# Patient Record
Sex: Male | Born: 1955
Health system: Southern US, Community
[De-identification: ages and names within clinical notes are randomized; demographics above are authoritative.]

## PROBLEM LIST (undated history)

## (undated) DIAGNOSIS — I10 Essential (primary) hypertension: Secondary | ICD-10-CM

## (undated) DIAGNOSIS — R51 Headache: Secondary | ICD-10-CM

## (undated) DIAGNOSIS — E7800 Pure hypercholesterolemia, unspecified: Secondary | ICD-10-CM

## (undated) HISTORY — PX: OTHER SURGICAL HISTORY: SHX169

---

## 1999-04-21 ENCOUNTER — Emergency Department (HOSPITAL_COMMUNITY): Admission: EM | Admit: 1999-04-21 | Discharge: 1999-04-21 | Payer: Self-pay | Admitting: Emergency Medicine

## 1999-04-21 ENCOUNTER — Encounter: Payer: Self-pay | Admitting: Emergency Medicine

## 1999-10-26 ENCOUNTER — Emergency Department (HOSPITAL_COMMUNITY): Admission: EM | Admit: 1999-10-26 | Discharge: 1999-10-26 | Payer: Self-pay | Admitting: Emergency Medicine

## 2012-09-08 ENCOUNTER — Encounter (HOSPITAL_COMMUNITY): Payer: Self-pay | Admitting: Emergency Medicine

## 2012-09-08 ENCOUNTER — Emergency Department (HOSPITAL_COMMUNITY)
Admission: EM | Admit: 2012-09-08 | Discharge: 2012-09-09 | Disposition: A | Discharge status: Home / Self Care | Payer: Self-pay | Attending: Emergency Medicine | Admitting: Emergency Medicine

## 2012-09-08 DIAGNOSIS — R059 Cough, unspecified: Secondary | ICD-10-CM | POA: Insufficient documentation

## 2012-09-08 DIAGNOSIS — R110 Nausea: Secondary | ICD-10-CM

## 2012-09-08 DIAGNOSIS — F411 Generalized anxiety disorder: Secondary | ICD-10-CM | POA: Insufficient documentation

## 2012-09-08 DIAGNOSIS — R05 Cough: Secondary | ICD-10-CM | POA: Insufficient documentation

## 2012-09-08 DIAGNOSIS — J111 Influenza due to unidentified influenza virus with other respiratory manifestations: Secondary | ICD-10-CM

## 2012-09-08 DIAGNOSIS — R197 Diarrhea, unspecified: Secondary | ICD-10-CM

## 2012-09-08 DIAGNOSIS — F17200 Nicotine dependence, unspecified, uncomplicated: Secondary | ICD-10-CM | POA: Insufficient documentation

## 2012-09-08 DIAGNOSIS — R6883 Chills (without fever): Secondary | ICD-10-CM | POA: Insufficient documentation

## 2012-09-08 MED ORDER — ACETAMINOPHEN 325 MG PO TABS
650.0000 mg | ORAL_TABLET | Freq: Once | ORAL | Status: AC
  Administered 2012-09-08: 650 mg via ORAL
  Filled 2012-09-08: qty 2

## 2012-09-08 NOTE — ED Notes (Signed)
Pt presents to the Ed by [facility].  Pt complains "I am cold."  Pt refused EMS to remove coat and get vitals signs.  Pt also complaining of chills.

## 2012-09-09 ENCOUNTER — Emergency Department (HOSPITAL_COMMUNITY): Payer: Self-pay

## 2012-09-09 LAB — CBC WITH DIFFERENTIAL/PLATELET
Eosinophils Absolute: 0.1 10*3/uL (ref 0.0–0.7)
HCT: 42.1 % (ref 39.0–52.0)
Hemoglobin: 14.4 g/dL (ref 13.0–17.0)
Lymphs Abs: 0.8 10*3/uL (ref 0.7–4.0)
MCH: 29.6 pg (ref 26.0–34.0)
Monocytes Absolute: 0.7 10*3/uL (ref 0.1–1.0)
Monocytes Relative: 12 % (ref 3–12)
Neutrophils Relative %: 72 % (ref 43–77)
RBC: 4.87 MIL/uL (ref 4.22–5.81)

## 2012-09-09 LAB — COMPREHENSIVE METABOLIC PANEL
Alkaline Phosphatase: 54 U/L (ref 39–117)
BUN: 15 mg/dL (ref 6–23)
Chloride: 103 mEq/L (ref 96–112)
Creatinine, Ser: 1.03 mg/dL (ref 0.50–1.35)
GFR calc Af Amer: >90 mL/min (ref >90)
Glucose, Bld: 96 mg/dL (ref 70–99)
Potassium: 4.1 mEq/L (ref 3.5–5.1)
Total Bilirubin: 0.2 mg/dL — ABNORMAL LOW (ref 0.3–1.2)
Total Protein: 6.9 g/dL (ref 6.0–8.3)

## 2012-09-09 LAB — URINALYSIS, ROUTINE W REFLEX MICROSCOPIC
Ketones, ur: NEGATIVE mg/dL
Leukocytes, UA: NEGATIVE
Nitrite: NEGATIVE
Protein, ur: NEGATIVE mg/dL
Urobilinogen, UA: 1 mg/dL (ref 0.0–1.0)

## 2012-09-09 LAB — INFLUENZA PANEL BY PCR (TYPE A & B): Influenza B By PCR: NEGATIVE

## 2012-09-09 MED ORDER — SODIUM CHLORIDE 0.9 % IV SOLN
1000.0000 mL | INTRAVENOUS | Status: DC
  Administered 2012-09-09: 1000 mL via INTRAVENOUS

## 2012-09-09 MED ORDER — PROMETHAZINE HCL 25 MG PO TABS: 25.0000 mg | ORAL_TABLET | Freq: Four times a day (QID) | ORAL | Status: DC | PRN

## 2012-09-09 MED ORDER — SODIUM CHLORIDE 0.9 % IV SOLN
1000.0000 mL | Freq: Once | INTRAVENOUS | Status: AC
  Administered 2012-09-09: 1000 mL via INTRAVENOUS

## 2012-09-09 MED ORDER — OSELTAMIVIR PHOSPHATE 75 MG PO CAPS: 75.0000 mg | ORAL_CAPSULE | Freq: Two times a day (BID) | ORAL | Status: DC

## 2012-09-09 MED ORDER — MUCINEX DM MAXIMUM STRENGTH 60-1200 MG PO TB12: 1.0000 | ORAL_TABLET | Freq: Two times a day (BID) | ORAL | Status: DC

## 2012-09-09 MED ORDER — ONDANSETRON HCL 4 MG/2ML IJ SOLN
4.0000 mg | Freq: Once | INTRAMUSCULAR | Status: AC
  Administered 2012-09-09: 4 mg via INTRAVENOUS
  Filled 2012-09-09: qty 2

## 2012-09-09 MED ORDER — IBUPROFEN 800 MG PO TABS
800.0000 mg | ORAL_TABLET | Freq: Once | ORAL | Status: AC
  Administered 2012-09-09: 800 mg via ORAL
  Filled 2012-09-09: qty 1

## 2012-09-09 NOTE — ED Notes (Signed)
Pt sts he began feeling ill a few days ago with fever and generalized body aches. Patient chilled at this time. Denies vomiting or diarrhea, endorses nausea.

## 2012-09-09 NOTE — ED Provider Notes (Signed)
History     CSN: [id number]  Arrival date & time 09/08/12  2316   First MD Initiated Contact with Patient 09/08/12 2330      Chief Complaint  Patient presents with  . Chills    (Consider location/radiation/quality/duration/timing/severity/associated sxs/prior treatment) HPI  Patient presents EMS for feeling cold. Reports today he started having chills with cough that's nonproductive. He states sometimes he feels short of breath. He denies chest pain but has intense diffuse body aches. He has mild sore throat. He reports he's had nausea without vomiting and he has been having diarrhea. He denies being around a meal since that period    PCP none   History reviewed. No pertinent past medical history.  History reviewed. No pertinent past surgical history.  History reviewed. No pertinent family history.  History  Substance Use Topics  . Smoking status: Current Every Day Smoker -- 1.0 packs/day  . Smokeless tobacco: Not on file  . Alcohol Use: Yes   Recently moved from [place], [place]   Review of Systems  All other systems reviewed and are negative.    Allergies  Review of patient's allergies indicates no known allergies.  Home Medications   Current Outpatient Rx  Name  Route  Sig  Dispense  Refill  . MUCINEX DM MAXIMUM STRENGTH 60-1200 MG PO TB12   Oral   Take 1 tablet by mouth 2 (two) times daily.   28 each   0   . OSELTAMIVIR PHOSPHATE 75 MG PO CAPS   Oral   Take 1 capsule (75 mg total) by mouth every 12 (twelve) hours.   10 capsule   0   . PROMETHAZINE HCL 25 MG PO TABS   Oral   Take 1 tablet (25 mg total) by mouth every 6 (six) hours as needed for nausea.   8 tablet   0     BP 177/80  Pulse 95  Temp 101.2 F (38.4 C) (Oral)  Resp 24  SpO2 100%  Vital signs normal except for fever   Physical Exam  Nursing note and vitals reviewed. Constitutional: He is oriented to person, place, and time. He appears well-developed and well-nourished.   Non-toxic appearance. He does not appear ill. He appears distressed.       Patient constantly complaining he feels cold  HENT:  Head: Normocephalic and atraumatic.  Right Ear: External ear normal.  Left Ear: External ear normal.  Nose: Nose normal. No mucosal edema or rhinorrhea.  Mouth/Throat: Oropharynx is clear and moist and mucous membranes are normal. No dental abscesses or uvula swelling.  Eyes: Conjunctivae normal and EOM are normal. Pupils are equal, round, and reactive to light.  Neck: Normal range of motion and full passive range of motion without pain. Neck supple.  Cardiovascular: Normal rate, regular rhythm and normal heart sounds.  Exam reveals no gallop and no friction rub.   No murmur heard. Pulmonary/Chest: Effort normal and breath sounds normal. No respiratory distress. He has no wheezes. He has no rhonchi. He has no rales. He exhibits no tenderness and no crepitus.  Abdominal: Soft. Normal appearance and bowel sounds are normal. He exhibits no distension. There is no tenderness. There is no rebound and no guarding.  Musculoskeletal: Normal range of motion. He exhibits no edema and no tenderness.       Moves all extremities well.   Neurological: He is alert and oriented to person, place, and time. He has normal strength. No cranial nerve deficit.  Skin: Skin is  warm, dry and intact. No rash noted. No erythema. No pallor.  Psychiatric: His speech is normal and behavior is normal. His mood appears not anxious.       Anxious    ED Course  Procedures (including critical care time)  Medications  0.9 %  sodium chloride infusion (0 mL Intravenous Stopped 09/09/12 0223)    Followed by  0.9 %  sodium chloride infusion (1000 mL Intravenous New Bag/Given 09/09/12 0225)  acetaminophen (TYLENOL) tablet 650 mg (650 mg Oral Given 09/08/12 2338)  ibuprofen (ADVIL,MOTRIN) tablet 800 mg (800 mg Oral Given 09/09/12 0111)  ondansetron (ZOFRAN) injection 4 mg (4 mg Intravenous Given  09/09/12 0107)   Patient feeling better at time of discharge. He was advised to stay away from the general public as he is contagious.  Results for orders placed during the hospital encounter of 09/08/12  CBC WITH DIFFERENTIAL      Component Value Range   WBC 5.8  4.0 - 10.5 K/uL   RBC 4.87  4.22 - 5.81 MIL/uL   Hemoglobin 14.4  13.0 - 17.0 g/dL   HCT 14.7  82.9 - 56.2 %   MCV 86.4  78.0 - 100.0 fL   MCH 29.6  26.0 - 34.0 pg   MCHC 34.2  30.0 - 36.0 g/dL   RDW 13.0  86.5 - 78.4 %   Platelets 168  150 - 400 K/uL   Neutrophils Relative 72  43 - 77 %   Neutro Abs 4.2  1.7 - 7.7 K/uL   Lymphocytes Relative 14  12 - 46 %   Lymphs Abs 0.8  0.7 - 4.0 K/uL   Monocytes Relative 12  3 - 12 %   Monocytes Absolute 0.7  0.1 - 1.0 K/uL   Eosinophils Relative 2  0 - 5 %   Eosinophils Absolute 0.1  0.0 - 0.7 K/uL   Basophils Relative 0  0 - 1 %   Basophils Absolute 0.0  0.0 - 0.1 K/uL  COMPREHENSIVE METABOLIC PANEL      Component Value Range   Sodium 136  135 - 145 mEq/L   Potassium 4.1  3.5 - 5.1 mEq/L   Chloride 103  96 - 112 mEq/L   CO2 23  19 - 32 mEq/L   Glucose, Bld 96  70 - 99 mg/dL   BUN 15  6 - 23 mg/dL   Creatinine, Ser 6.96  0.50 - 1.35 mg/dL   Calcium 9.6  8.4 - 29.5 mg/dL   Total Protein 6.9  6.0 - 8.3 g/dL   Albumin 3.6  3.5 - 5.2 g/dL   AST 21  0 - 37 U/L   ALT 29  0 - 53 U/L   Alkaline Phosphatase 54  39 - 117 U/L   Total Bilirubin 0.2 (*) 0.3 - 1.2 mg/dL   GFR calc non Af Amer 79 (*) >90 mL/min   GFR calc Af Amer >90  >90 mL/min  URINALYSIS, ROUTINE W REFLEX MICROSCOPIC      Component Value Range   Color, Urine YELLOW  YELLOW   APPearance CLEAR  CLEAR   Specific Gravity, Urine 1.026  1.005 - 1.030   pH 7.0  5.0 - 8.0   Glucose, UA NEGATIVE  NEGATIVE mg/dL   Hgb urine dipstick NEGATIVE  NEGATIVE   Bilirubin Urine NEGATIVE  NEGATIVE   Ketones, ur NEGATIVE  NEGATIVE mg/dL   Protein, ur NEGATIVE  NEGATIVE mg/dL   Urobilinogen, UA 1.0  0.0 - 1.0 mg/dL  Nitrite  NEGATIVE  NEGATIVE   Leukocytes, UA NEGATIVE  NEGATIVE  RAPID STREP SCREEN      Component Value Range   Streptococcus, Group A Screen (Direct) NEGATIVE  NEGATIVE   .Laboratory interpretation all normal except pending flu test   Dg Chest 2 View  09/09/2012  *RADIOLOGY REPORT*  Clinical Data: Fever.  Cough.  Chills.  CHEST - 2 VIEW  Comparison: None.  Findings: Cardiomediastinal silhouette unremarkable.  Prominent bronchovascular markings diffusely and moderate central peribronchial thickening.  No localized airspace consolidation.  No pleural effusions.  Degenerative changes involving the thoracic spine.  IMPRESSION: Moderate changes of bronchitis and/or asthma without localized airspace pneumonia.   Original Report Authenticated By: Hulan Saas, M.D.      1. Influenza-like illness   2. Nausea   3. Diarrhea    New Prescriptions   DEXTROMETHORPHAN-GUAIFENESIN (MUCINEX DM MAXIMUM STRENGTH) 60-1200 MG TB12    Take 1 tablet by mouth 2 (two) times daily.   OSELTAMIVIR (TAMIFLU) 75 MG CAPSULE    Take 1 capsule (75 mg total) by mouth every 12 (twelve) hours.   PROMETHAZINE (PHENERGAN) 25 MG TABLET    Take 1 tablet (25 mg total) by mouth every 6 (six) hours as needed for nausea.    Plan discharge  Devoria Albe, MD, FACEP    MDM          Ward Givens, MD 09/09/12 [id number]

## 2013-02-02 ENCOUNTER — Emergency Department (HOSPITAL_COMMUNITY)
Admission: EM | Admit: 2013-02-02 | Discharge: 2013-02-02 | Disposition: A | Discharge status: Home / Self Care | Payer: BC Managed Care – PPO | Attending: Emergency Medicine | Admitting: Emergency Medicine

## 2013-02-02 ENCOUNTER — Encounter (HOSPITAL_COMMUNITY): Payer: Self-pay | Admitting: Emergency Medicine

## 2013-02-02 DIAGNOSIS — K0889 Other specified disorders of teeth and supporting structures: Secondary | ICD-10-CM

## 2013-02-02 DIAGNOSIS — K089 Disorder of teeth and supporting structures, unspecified: Secondary | ICD-10-CM | POA: Insufficient documentation

## 2013-02-02 DIAGNOSIS — F17200 Nicotine dependence, unspecified, uncomplicated: Secondary | ICD-10-CM | POA: Insufficient documentation

## 2013-02-02 MED ORDER — PENICILLIN V POTASSIUM 500 MG PO TABS: 500.0000 mg | ORAL_TABLET | Freq: Three times a day (TID) | ORAL | Status: DC

## 2013-02-02 MED ORDER — OXYCODONE-ACETAMINOPHEN 5-325 MG PO TABS
2.0000 | ORAL_TABLET | Freq: Once | ORAL | Status: AC
  Administered 2013-02-02: 2 via ORAL
  Filled 2013-02-02: qty 2

## 2013-02-02 MED ORDER — OXYCODONE-ACETAMINOPHEN 5-325 MG PO TABS: 1.0000 | ORAL_TABLET | Freq: Four times a day (QID) | ORAL | Status: DC | PRN

## 2013-02-02 NOTE — ED Notes (Signed)
PT. REPORTS RIGHT PREMOLAR PAIN FOR SEVERAL WEEKS UNRELIEVED BY OTC PAIN MEDICATIONS .

## 2013-02-02 NOTE — ED Provider Notes (Signed)
History    This chart was scribed for non-physician practitioner, Roxy Horseman PA-C working with Vida Roller, MD by Donne Anon, ED Scribe. This patient was seen in room TR07C/TR07C and the patient's care was started at 2145   CSN: [id number]  Arrival date & time 02/02/13  2058   First MD Initiated Contact with Patient 02/02/13 2145      Chief Complaint  Patient presents with  . Dental Pain     The history is provided by the patient. No language interpreter was used.   HPI Comments: Patrick Gilbert is a 57 y.o. male who presents to the Emergency Department complaining of 1 month of gradual onset, gradually worsening, constant right premolar pain due to a broken tooth. He has tried ibuprofen medication with no relief. The pain is worse when he eats.   History reviewed. No pertinent past medical history.  History reviewed. No pertinent past surgical history.  No family history on file.  History  Substance Use Topics  . Smoking status: Current Every Day Smoker -- 1.00 packs/day  . Smokeless tobacco: Not on file  . Alcohol Use: Yes      Review of Systems  HENT: Positive for dental problem.   All other systems reviewed and are negative.    Allergies  Review of patient's allergies indicates no known allergies.  Home Medications   Current Outpatient Rx  Name  Route  Sig  Dispense  Refill  . ibuprofen (ADVIL,MOTRIN) 100 MG tablet   Oral   Take 400 mg by mouth every 6 (six) hours as needed for pain or fever.           BP 124/84  Pulse 81  Temp(Src) 97.9 F (36.6 C) (Oral)  Resp 14  SpO2 99%  Physical Exam  Nursing note and vitals reviewed. Constitutional: He is oriented to person, place, and time. He appears well-developed and well-nourished. No distress.  HENT:  Head: Normocephalic and atraumatic.  Mouth/Throat:    Poor dentition throughout.  Affected tooth as diagrammed.  No signs of peritonsillar or tonsillar abscess.  No signs of gingival  abscess. Oropharynx is clear and without exudates.  Uvula is midline.  Airway is intact. No signs of Ludwig's angina.   Eyes: EOM are normal.  Neck: Neck supple. No tracheal deviation present.  Cardiovascular: Normal rate.   Pulmonary/Chest: Effort normal. No respiratory distress.  Musculoskeletal: Normal range of motion.  Neurological: He is alert and oriented to person, place, and time.  Skin: Skin is warm and dry.  Psychiatric: He has a normal mood and affect. His behavior is normal.    ED Course  Procedures (including critical care time) DIAGNOSTIC STUDIES: Oxygen Saturation is 99% on room air, normal by my interpretation.    COORDINATION OF CARE: 10:53 PM Discussed treatment plan which includes pain medication and antibiotics with pt at bedside and pt agreed to plan. Dental referral given.    Labs Reviewed - No data to display No results found.   1. Pain, dental       MDM  Patient with uncomplicated dental pain. Will treat with pain medicine and antibiotics. Recommend dental referral for definitive care.  I personally performed the services described in this documentation, which was scribed in my presence. The recorded information has been reviewed and is accurate.          Roxy Horseman, PA-C 02/03/13 0021

## 2013-02-03 NOTE — ED Provider Notes (Signed)
Medical screening examination/treatment/procedure(s) were performed by non-physician practitioner and as supervising physician I was immediately available for consultation/collaboration.    Jesenia Spera D Tyshell Ramberg, MD 02/03/13 1401 

## 2014-05-11 ENCOUNTER — Encounter (HOSPITAL_COMMUNITY): Payer: Self-pay | Admitting: Emergency Medicine

## 2014-05-11 ENCOUNTER — Emergency Department (HOSPITAL_COMMUNITY)
Admission: EM | Admit: 2014-05-11 | Discharge: 2014-05-11 | Disposition: A | Discharge status: Home / Self Care | Payer: BC Managed Care – PPO | Attending: Emergency Medicine | Admitting: Emergency Medicine

## 2014-05-11 ENCOUNTER — Emergency Department (HOSPITAL_COMMUNITY): Payer: BC Managed Care – PPO

## 2014-05-11 DIAGNOSIS — S4352XA Sprain of left acromioclavicular joint, initial encounter: Secondary | ICD-10-CM

## 2014-05-11 DIAGNOSIS — F17200 Nicotine dependence, unspecified, uncomplicated: Secondary | ICD-10-CM | POA: Insufficient documentation

## 2014-05-11 DIAGNOSIS — S4350XA Sprain of unspecified acromioclavicular joint, initial encounter: Secondary | ICD-10-CM | POA: Insufficient documentation

## 2014-05-11 DIAGNOSIS — G5602 Carpal tunnel syndrome, left upper limb: Secondary | ICD-10-CM

## 2014-05-11 DIAGNOSIS — G5600 Carpal tunnel syndrome, unspecified upper limb: Secondary | ICD-10-CM | POA: Insufficient documentation

## 2014-05-11 DIAGNOSIS — X500XXA Overexertion from strenuous movement or load, initial encounter: Secondary | ICD-10-CM | POA: Insufficient documentation

## 2014-05-11 DIAGNOSIS — S4980XA Other specified injuries of shoulder and upper arm, unspecified arm, initial encounter: Secondary | ICD-10-CM | POA: Insufficient documentation

## 2014-05-11 DIAGNOSIS — S46909A Unspecified injury of unspecified muscle, fascia and tendon at shoulder and upper arm level, unspecified arm, initial encounter: Secondary | ICD-10-CM | POA: Insufficient documentation

## 2014-05-11 DIAGNOSIS — Y9389 Activity, other specified: Secondary | ICD-10-CM | POA: Insufficient documentation

## 2014-05-11 DIAGNOSIS — Y9289 Other specified places as the place of occurrence of the external cause: Secondary | ICD-10-CM | POA: Insufficient documentation

## 2014-05-11 MED ORDER — PREDNISONE 10 MG PO TABS: ORAL_TABLET | ORAL | Status: DC

## 2014-05-11 MED ORDER — MELOXICAM 15 MG PO TABS: 15.0000 mg | ORAL_TABLET | Freq: Every day | ORAL | Status: DC

## 2014-05-11 NOTE — ED Notes (Signed)
Pt lifts a lot at work and  Today has shoulder pain an dknot in left shoulder also and pain shooting down arm.

## 2014-05-11 NOTE — Discharge Instructions (Signed)
Most shoulder pain is caused by soft tissue problems rather than arthritis.  Rotator cuff tendonitis or tendonosis, rotator cuff tears, impingement syndrome and cartilege (labrum tears) are a few of the common causes of shoulder pain.  Fortunately, most of these can be treated with conservative measures as outlined below. ° °Do not do the following: °· Doing any work with the arms above shoulder level (especially lifting) until the pain has subsided. °· Sleeping on the affected side.  Especially avoid sleeping with your arm under your head or your pillow.  This is a habit that is hard to break.  Some people have to pin the arm of their pajamas to the chest area to prevent this. ° °Do the following: °· Do the shoulder exercises below twice daily followed by ice for 10 minutes. °· If no better in 1 month, follow up here, with your primary care doctor, or with an orthopedist. °· Use of over the counter pain meds can be of help.  Tylenol (or acetaminophen) is the safest to use.  It often helps to take this regularly.  You can take up to 2 325 mg tablets 5 times daily, but it best to start out much lower that that, perhaps 2 325 mg tablets twice daily, then increase from there. People who are on the blood thinner warfarin have to be careful about taking high doses of Tylenol.  For people who are able to tolerate them, ibuprofen and Aleve can also help with the pain.  You should discuss these agents with your physician before taking them.  People with chronic kidney disease, hypertension, peptic ulcer disease, and reflux can suffer adverse side effects. They should not be taken with warfarin. The maximum dosage of ibuprofen is 800 mg 3 times daily with meals.  The maximum dosage of Aleve is 2 and 1/2 tablets twice daily with food, but again, start out low and gradually increase the dose until adequate pain relief is achieved. Ibuprofen and Aleve should always be taken with food. ° ° ° ° ° °Carpal tunnel syndrome is caused  by compression of the median nerve in the wrist.  Most often, inflammation of the tendons that pass through the carpal tunnel and surround the median nerve is the causative factor. ° °Wear your wrist splint as much as possible, especially at night.   ° °The following exercises should be done twice daily: ° °Exercises for Carpal Tunnel Syndrome  °Wrists Exercise 1. °  Make a loose right fist, palm up, and use the left hand to press gently down against the clenched hand. °  Resist the force with the closed right hand for 5 seconds. Be sure to keep the wrist straight. °  Turn the right fist palm down, and press the knuckles against the left open palm for 5 seconds. °  Finally, turn the right palm so the thumb-side of the fist is up, and press down again for 5 seconds. °  Repeat with the left hand.  ° Exercise 2. °  Hold one hand straight up shoulder-high with fingers together and palm facing outward. (The position looks like a shoulder-high salute.) °  With the other hand, bend the hand being exercised backward with the fingers still held together and hold for 5 seconds. °  Spread the fingers and thumb open while the hand is still bent back and hold for 5 seconds. °  Repeat five times for each hand.  ° Exercise 3. (Wrist Circle) °  Hold the second   and third fingers up, and close the others. °  Draw five clockwise circles in the air with the two finger tips. °  Draw five more counterclockwise circles. °  Repeat with the other hand.  °Fingers and Hand Exercise 1. °  Clench the fingers of one hand into a fist tightly. °  Release, fanning out the fingers. °  Do this five times. Repeat with the other hand.  ° Exercise 2. °  To exercise the thumb, bend it against the palm beneath the little finger, and hold for 5 seconds. °  Spread the fingers apart, palm up, and hold for 5 seconds. °  Repeat five to 10 times with each hand.  ° Exercise 3. °  Gently pull the thumb out and back and hold for 5 seconds. °  Repeat five to 10  times with each hand.  °Forearms (stretching these muscles will reduce tension in the wrist)   Place the hands together in front of the chest, fingers pointed upward in a prayer-like position. °  Keeping the palms flat together, raise the elbows to stretch the forearm muscles. °  Stretch for 10 seconds. °  Gently shake the hands limp for a few seconds to loosen them. °  Repeat frequently when the hands or arms tire from activity.  °Neck and Shoulders Exercise 1. °  Sit upright and place the right hand on top of the left shoulder. °  Hold that shoulder down, and slowly tip the head down toward the right. °  Keep the face pointed forward, or even turned slightly toward the right. °  Hold this stretch gently for 5 seconds. °  Repeat on the other side.  ° Exercise 2. °  Stand in a relaxed position with the arms at the side. °  Shrug the shoulders up, then squeeze the shoulders back, then stretch the shoulders down, and then press them forward. °  The entire exercise should take about 7 seconds.  ° ° °If you are employed in a job that requires repetitive hand or wrist movement (this includes keyboarding or use of a computer mouse) paying attention to work ergonomics is of utmost importance: ° °Preventing CTS in Keyboard Workers °Altering the way a person performs repetitive activities may help prevent inflammation in the hand and wrist. Most of the interventions described below have been found to reduce repetitive motion problems in the muscles and tendons of the hand and arm. They may reduce the incidence of carpal tunnel syndrome, although there is no definite proof of this effect. °Replacing old tools with ergonomically designed new ones can be very helpful. °Rest Periods and Avoiding Repetition. Anyone who does repetitive tasks should begin with a short warm-up period, take frequent breaks, and avoid overexertion of the hand and finger muscles whenever possible. Employers should be urged to vary the tasks and work  content of their employees. °Taking multiple "microbreaks" (about 3 minutes each) reduces strain and discomfort without decreasing productivity. Such breaks may include the following: °  Shaking or stretching the limbs °  Leaning back in the chair °  Squeezing the shoulder blades together. °  Taking deep breaths °Good Posture. Good posture is extremely important in preventing carpal tunnel syndrome, particularly for typists and computer users. °  The worker should sit with the spine against the back of the chair with the shoulders relaxed. °  The elbows should rest along the sides of the body, with wrists straight. °  The feet should be   firmly on the floor or on a footrest. °  Typing materials should be at eye level so that the neck does not bend over the work. °  Keeping the neck flexible and head upright maintains circulation and nerve function to the arms and hands. One method for finding the correct head position is the "pigeon" movement. Keeping the chin level, glide the head slowly and gently forward and backward in small movements, avoiding neck discomfort. °Good Office Furniture. Poorly designed office furniture is a major contributor to bad posture. Chairs should be adjustable for height, with a supportive backrest. Custom-designed chairs, made for people who do not fit in standard chairs, can be expensive. However, the costs are often offset by the savings in medical expenses that follow injuries related to bad posture. °Voice Recognition Software. For CTS patients who must use a computer frequently, a variety of voice recognition software packages (ViaVoice, Voice Xpress, Dragon NaturallySpeaking, IListen) are now available, enabling virtually hands-free computer use. °Keyboard and Mouse Tips. Anyone using a keyboard and mouse has some options that may help protect the hands. °  The tension of the keys should be adjusted so they can be depressed without excessive force. °  The hands and wrists should  remain in a relaxed position to avoid excessive force on the keyboard. °  A 2003 study suggested that mouse-use poses a higher risk than keyboard use. Replacing the mouse with a trackball device and the standard keyboard with a jointed-type keyboard are helpful substitutions. °  Wrist rests, which fit under most keyboards, can help keep the wrists and fingers in a comfortable position. °  Some people recommend keeping the computer mouse as close to the keyboard and the user's body as possible, to reduce shoulder muscle movement. °  The mouse should be held lightly, with the wrist and forearm relaxed. New mouse supports are also available that relieve stress on the hand and support the wrist. °  Some people cut their mouse pads in half to reduce movement. °Innovative keyboard designs may reduce hand stress: °  Alternative geometry keyboards (Microsoft Natural Keyboard, Apple Adjustable Keyboard) allow the user to adjust and modify hand positions as well as adjust key tension. Most have a split or "slanted" keyboard that places the wrists at an angle. Studies suggest they are useful in promoting a neutral position for the wrist. °  The continuous passive motion (CPM) keyboard lifts and declines gently and automatically every 3 minutes to break tension on the hands and wrist. °  A keyless keyboard (orbiTouch) is an innovative device that uses two domes. The typist covers the domes with their hands and slides them into different positions that represent letters. °Reducing Force from Hand Tools °The force placed on the fingers, hands, and wrists by a repetitive task is an important contributor to CTS. To alleviate the effect of force on the wrist, tools and tasks should be designed so that the wrist position is the same as it would be if the arms dangled in a relaxed manner at the sides. °  No task should require the wrist to deviate from side to side or to remain flexed or highly extended for long periods. °  The  handles of hand tools such as screwdrivers, scrapers, paint brushes, and buffers should be designed so that the force of the worker's grip is distributed across the muscle between the base of the thumb and the little finger, not just in the center of the palm. °    People who need to hold tools (including pencils and steering wheels) for long periods of time should grip them as loosely as possible. °  In order to apply force appropriately, the ability to feel an object is extremely important. Tools with textured handles are helpful. °  If possible, people should avoid working at low temperatures, which reduces sensation in hands and fingers. °  Power tools and machines should be designed to minimize vibrations. °  Wearing thick gloves, when possible, may lessen the shock transmitted to the hands and wrists. ° ° ° °

## 2014-05-11 NOTE — ED Provider Notes (Signed)
Chief Complaint   Chief Complaint  Patient presents with  . Shoulder Pain    History of Present Illness   Patrick Gilbert is a 58 year old male who just are working at a [employer]. Yesterday he grabbed a heavy mattress and pulled it towards him, thereafter he experienced a sharp pain in the anterior left shoulder. This shot down his arm into his hand. His hand is felt numb for years, particularly over the thumb, index, and middle fingers. He has pain in the forearm and the shoulder and notes that it not or avulsion the anterior shoulder. It hurts to abduct but he has a full range of motion.  Review of Systems   Other than as noted above, the patient denies any of the following symptoms: Systemic:  No fevers or chills. Musculoskeletal:  No joint pain, arthritis, swelling, back pain, or neck pain. No history of arthritis.  Neurological:  No muscular weakness or paresthesia.  PMFSH   Past medical history, family history, social history, meds, and allergies were reviewed.    Physical Examination     Vital signs:  BP 136/74  Pulse 72  Temp(Src) 98.6 F (37 C) (Oral)  Resp 16  Ht 5\' 6"  (1.676 m)  Wt 189 lb (85.73 kg)  BMI 30.52 kg/m2  SpO2 98% Gen:  Alert and oriented times 3.  In no distress. Musculoskeletal: He has pain in the anterior shoulder. There is no swelling, bruising, or deformity. His shoulder has a full range of motion with minimal pain. Neer test was negative.  Hawkins test was negative.  Empty cans test was negative. Speed's test was negative.  Yergason sign was negative.  Otherwise, all joints had a full a ROM with no swelling, bruising or deformity.  No edema, pulses full. Extremities were warm and pink.  Capillary refill was brisk. He has no pain to palpation over the wrist. No swelling. Tinel's and Phalen's signs were negative. Skin:  Clear, warm and dry.  No rash. Neuro:  Alert and oriented times 3.  Muscle strength was normal.  Sensation was intact to light  touch.   Radiology   Dg Shoulder Left  05/11/2014   CLINICAL DATA:  Knot on the superior margin of the left shoulder with pain after a lifting injury.  EXAM: LEFT SHOULDER - 2+ VIEW  COMPARISON:  None.  FINDINGS: No acute bony or joint abnormality is identified. Moderate acromioclavicular degenerative change is seen. Imaged left lung and ribs are unremarkable.  IMPRESSION: No acute finding.  Moderate acromioclavicular osteoarthritis.   Electronically Signed   By: Drusilla Kanner M.D.   On: 05/11/2014 14:09   I reviewed the images independently and personally and concur with the radiologist's findings.  [facility]   Placed in a shoulder sling..  Assessment   The primary encounter diagnosis was Carpal tunnel syndrome of left wrist. A diagnosis of Acromioclavicular sprain, left, initial encounter was also pertinent to this visit.  Plan     1.  Meds:  The following meds were prescribed:   Discharge Medication List as of 05/11/2014  2:29 PM    START taking these medications   Details  meloxicam (MOBIC) 15 MG tablet Take 1 tablet (15 mg total) by mouth daily., Starting 05/11/2014, Until Discontinued, Print    predniSONE (DELTASONE) 10 MG tablet 2 daily for 14 days, then 1 daily for 14 days, Print        2.  Patient Education/Counseling:  The patient was given  appropriate handouts, self care instructions, and instructed in symptomatic relief.  Began on shoulder exercises.  3.  Follow up:  The patient was told to follow up here if no better in 3 to 4 days, or sooner if becoming worse in any way, and given some red flag symptoms such as worsening pain or new neurological symptoms which would prompt immediate return.  Patient swears this has not a workers comp injury, so he was instructed to followup with Dr. Magnus Ivan.     Reuben Likes, MD 05/11/14 2031

## 2014-08-14 ENCOUNTER — Encounter (HOSPITAL_COMMUNITY): Payer: Self-pay | Admitting: Emergency Medicine

## 2014-08-14 ENCOUNTER — Emergency Department (HOSPITAL_COMMUNITY)
Admission: EM | Admit: 2014-08-14 | Discharge: 2014-08-14 | Disposition: A | Discharge status: Home / Self Care | Payer: BC Managed Care – PPO | Attending: Emergency Medicine | Admitting: Emergency Medicine

## 2014-08-14 DIAGNOSIS — K0510 Chronic gingivitis, plaque induced: Secondary | ICD-10-CM | POA: Insufficient documentation

## 2014-08-14 DIAGNOSIS — Z791 Long term (current) use of non-steroidal anti-inflammatories (NSAID): Secondary | ICD-10-CM | POA: Insufficient documentation

## 2014-08-14 DIAGNOSIS — Z7952 Long term (current) use of systemic steroids: Secondary | ICD-10-CM | POA: Insufficient documentation

## 2014-08-14 DIAGNOSIS — Z720 Tobacco use: Secondary | ICD-10-CM | POA: Insufficient documentation

## 2014-08-14 DIAGNOSIS — K088 Other specified disorders of teeth and supporting structures: Secondary | ICD-10-CM | POA: Insufficient documentation

## 2014-08-14 DIAGNOSIS — K0889 Other specified disorders of teeth and supporting structures: Secondary | ICD-10-CM

## 2014-08-14 DIAGNOSIS — K029 Dental caries, unspecified: Secondary | ICD-10-CM | POA: Insufficient documentation

## 2014-08-14 MED ORDER — HYDROCODONE-ACETAMINOPHEN 5-325 MG PO TABS: 1.0000 | ORAL_TABLET | Freq: Four times a day (QID) | ORAL | Status: DC | PRN

## 2014-08-14 MED ORDER — PENICILLIN V POTASSIUM 500 MG PO TABS: 500.0000 mg | ORAL_TABLET | Freq: Three times a day (TID) | ORAL | Status: AC

## 2014-08-14 NOTE — Discharge Instructions (Signed)
Please call and set up an appointment with dentist Please rest and stay hydrated Please apply warm compressions and massage Please avoid chewing-please use soft foods such as yogurt pureed diet Please take medications as prescribed - while on pain medications there is to be no drinking alcohol, driving, operating any heavy machinery. If extra please dispose in a proper manner. Please do not take any extra Tylenol with this medication for this can lead to Tylenol overdose and liver issues.  Please continue to monitor symptoms closely and if symptoms are to worsen or change (fever greater than 101, chills, sweating, nausea, vomiting, chest pain, shortness of breathe, difficulty breathing, weakness, numbness, tingling, worsening or changes to pain pattern, facial swelling, neck pain, neck stiffness, inability to swallow, swelling to the face, drainage or bleeding from the mouth) please report back to the Emergency Department immediately.     Dental Pain A tooth ache may be caused by cavities (tooth decay). Cavities expose the nerve of the tooth to air and hot or cold temperatures. It may come from an infection or abscess (also called a boil or furuncle) around your tooth. It is also often caused by dental caries (tooth decay). This causes the pain you are having. DIAGNOSIS  Your caregiver can diagnose this problem by exam. TREATMENT   If caused by an infection, it may be treated with medications which kill germs (antibiotics) and pain medications as prescribed by your caregiver. Take medications as directed.  Only take over-the-counter or prescription medicines for pain, discomfort, or fever as directed by your caregiver.  Whether the tooth ache today is caused by infection or dental disease, you should see your dentist as soon as possible for further care. SEEK MEDICAL CARE IF: The exam and treatment you received today has been provided on an emergency basis only. This is not a substitute for  complete medical or dental care. If your problem worsens or new problems (symptoms) appear, and you are unable to meet with your dentist, call or return to this location. SEEK IMMEDIATE MEDICAL CARE IF:   You have a fever.  You develop redness and swelling of your face, jaw, or neck.  You are unable to open your mouth.  You have severe pain uncontrolled by pain medicine. MAKE SURE YOU:   Understand these instructions.  Will watch your condition.  Will get help right away if you are not doing well or get worse. Document Released: 09/01/2005 Document Revised: 11/24/2011 Document Reviewed: 04/19/2008 [facility] Patient Information 2015 [place], [place]. This information is not intended to replace advice given to you by your health care provider. Make sure you discuss any questions you have with your health care provider.  Diet and Dental Disease What you eat affects the health of your teeth. Diet plays an important role in developing healthy teeth and preventing dental disease, such as:  Tooth decay.  Gum (periodontal) disease.  Developmental defects of the enamel. This is when visible surfaces of the tooth do not form properly, leaving the tooth more prone to decay.  Dental erosion. This is when the teeth wear away. Knowing which foods promote strong teeth and which foods to stay away from can help you prevent poor oral health. If your diet lacks proper nutrients, it may be difficult for the tissues in your mouth to prevent dental disease. FOODS THAT PROMOTE DENTAL DISEASE The following foods either contain acids or create acid in your mouth that increases the risk of tooth decay:  Sugary foods, such as  candy and baked goods (cookies, cake).  Soft drinks (carbonated and non-carbonated) such as soda, sports drinks, and fruit juice.  Citrus fruits, such as oranges and lemons.  Berries.  Honey.  Herbal teas that contain berries and other fruits.  Wines and other alcoholic  beverages.  Vinegar or vinegar containing foods, such as pickles.  Starchy snacks such as crackers, potato chips, [place] fries, and pasta. Some of these foods have health benefits. Eat these foods in moderation. The more often you eat these foods, the more frequently you are exposing your teeth to the acid that causes dental diseases. FOODS THAT REDUCE THE RISK OF DENTAL DISEASE Certain foods help to keep the teeth strong and reduce the risk of tooth decay. These foods include:  Dairy products, such as cow's milk and cheese. Eating dairy with a meal or sugary snack reduces the risk of tooth decay.  Gums and foods that substitute sugar with sorbitol, mannitol, and xylitol.  Fluoride containing foods, such as black tea. Fluoride is a natural mineral that protects the teeth from tooth decay. Your caregiver may recommend fluoride toothpaste or a fluoride supplement.  Breast milk. DIETARY RECOMMENDATIONS FOR HEALTHY TEETH  Eat a healthy, well-balanced diet with fiber-rich fruits and vegetables and quality proteins (eggs, meat, poultry, and fish). A variety of foods each day in moderation is best.  Avoid frequent sugary snacks in between meals.  Avoid frequent sticky, chewy, sugary candies, such as gummy bears and other candies that stick to the teeth. Avoid sucking on candies for a long time.  Avoid drinks that contain added sugar. Even though they do not sit in the mouth for very long, they can promote tooth decay if consumed too frequently.  Avoid sugary foods and drinks late at night.  Avoid swishing or holding acidic or sugary drinks in your mouth. Using a straw limits contact with the teeth.  If you like frequent sugary treats, try eating a sugary dessert after a meal or with a dairy product, rather than eating it by itself.  Avoid starchy foods such as graham crackers that stick to your teeth.  Eat highly acidic and sugary foods in moderation, especially if you tend to develop  tooth decay. Eat citrus fruits or drinks 2 times per day or less. Limit foods with vinegar and sports drinks to 1 time per week.  Try rinsing your mouth with water after a sugary or acidic meal or drink. Rinsing may help to reduce the acid buildup in the mouth.  Limit alcohol.  Read labels to determine the amount of sugar in foods. PRACTICE GOOD DAILY ORAL HYGIENE   Have your teeth professionally cleaned at the dentist every 6 months.  Brush twice daily with a fluoride toothpaste.  Floss between your teeth daily.  Ask your caregiver if you need fluoride supplements or treatments.  Ask your caregiver if you should have sealants applied to some of your teeth. HOME CARE INSTRUCTIONS  Follow the guidelines included here to promote good oral health.  Follow all of your caregiver's instructions for managing your health condition(s).  See your caregiver for follow-up exams as directed. Document Released: 04/30/2011 Document Revised: 11/24/2011 Document Reviewed: 04/30/2011 [facility] Patient Information 2015 [place], [place]. This information is not intended to replace advice given to you by your health care provider. Make sure you discuss any questions you have with your health care provider.  Dental Care and Dentist Visits Dental care supports good overall health. Regular dental visits can also help you avoid  dental pain, bleeding, infection, and other more serious health problems in the future. It is important to keep the mouth healthy because diseases in the teeth, gums, and other oral tissues can spread to other areas of the body. Some problems, such as diabetes, heart disease, and pre-term labor have been associated with poor oral health.  See your dentist every 6 months. If you experience emergency problems such as a toothache or broken tooth, go to the dentist right away. If you see your dentist regularly, you may catch problems early. It is easier to be treated for problems in the early  stages.  WHAT TO EXPECT AT A DENTIST VISIT  Your dentist will look for many common oral health problems and recommend proper treatment. At your regular dental visit, you can expect:  Gentle cleaning of the teeth and gums. This includes scraping and polishing. This helps to remove the sticky substance around the teeth and gums (plaque). Plaque forms in the mouth shortly after eating. Over time, plaque hardens on the teeth as tartar. If tartar is not removed regularly, it can cause problems. Cleaning also helps remove stains.  Periodic X-rays. These pictures of the teeth and supporting bone will help your dentist assess the health of your teeth.  Periodic fluoride treatments. Fluoride is a natural mineral shown to help strengthen teeth. Fluoride treatmentinvolves applying a fluoride gel or varnish to the teeth. It is most commonly done in children.  Examination of the mouth, tongue, jaws, teeth, and gums to look for any oral health problems, such as:  Cavities (dental caries). This is decay on the tooth caused by plaque, sugar, and acid in the mouth. It is best to catch a cavity when it is small.  Inflammation of the gums caused by plaque buildup (gingivitis).  Problems with the mouth or malformed or misaligned teeth.  Oral cancer or other diseases of the soft tissues or jaws. KEEP YOUR TEETH AND GUMS HEALTHY For healthy teeth and gums, follow these general guidelines as well as your dentist's specific advice:  Have your teeth professionally cleaned at the dentist every 6 months.  Brush twice daily with a fluoride toothpaste.  Floss your teeth daily.  Ask your dentist if you need fluoride supplements, treatments, or fluoride toothpaste.  Eat a healthy diet. Reduce foods and drinks with added sugar.  Avoid smoking. TREATMENT FOR ORAL HEALTH PROBLEMS If you have oral health problems, treatment varies depending on the conditions present in your teeth and gums.  Your caregiver will  most likely recommend good oral hygiene at each visit.  For cavities, gingivitis, or other oral health disease, your caregiver will perform a procedure to treat the problem. This is typically done at a separate appointment. Sometimes your caregiver will refer you to another dental specialist for specific tooth problems or for surgery. SEEK IMMEDIATE DENTAL CARE IF:  You have pain, bleeding, or soreness in the gum, tooth, jaw, or mouth area.  A permanent tooth becomes loose or separated from the gum socket.  You experience a blow or injury to the mouth or jaw area. Document Released: 05/14/2011 Document Revised: 11/24/2011 Document Reviewed: 05/14/2011 [facility] Patient Information 2015 [place], [place]. This information is not intended to replace advice given to you by your health care provider. Make sure you discuss any questions you have with your health care provider.   Emergency Department Resource Guide 1) Find a Doctor and Pay Out of Pocket Although you won't have to find out who is covered by your  insurance plan, it is a good idea to ask around and get recommendations. You will then need to call the office and see if the doctor you have chosen will accept you as a new patient and what types of options they offer for patients who are self-pay. Some doctors offer discounts or will set up payment plans for their patients who do not have insurance, but you will need to ask so you aren't surprised when you get to your appointment.  2) Contact Your Local Health Department Not all health departments have doctors that can see patients for sick visits, but many do, so it is worth a call to see if yours does. If you don't know where your local health department is, you can check in your phone book. The CDC also has a tool to help you locate your state's health department, and many state websites also have listings of all of their local health departments.  3) Find a Walk-in Clinic If your illness is  not likely to be very severe or complicated, you may want to try a walk in clinic. These are popping up all over the country in pharmacies, drugstores, and shopping centers. They're usually staffed by nurse practitioners or physician assistants that have been trained to treat common illnesses and complaints. They're usually fairly quick and inexpensive. However, if you have serious medical issues or chronic medical problems, these are probably not your best option.  No Primary Care Doctor: - Call Health Connect at  [phone] - they can help you locate a primary care doctor that  accepts your insurance, provides certain services, etc. - Physician Referral Service- [phone]  Chronic Pain Problems: Organization         Address  Phone   Notes  [facility]  [phone] Patients need to be referred by their primary care doctor.   Medication Assistance: Organization         Address  Phone   Notes  [facility] Medication [facility] [address] [place], [place] [address] [phone] --Must be a resident of [facility] [place] -- Must have NO insurance coverage whatsoever (no Medicaid/ Medicare, etc.) -- The pt. MUST have a primary care doctor that directs their care regularly and follows them in the community   MedAssist  [phone]   [employer]  [phone]) [phone]    Agencies that provide inexpensive medical care: Organization         Address  Phone   Notes  [facility]  [phone]   [facility]    [phone]   [facility] [address] [place], [place] [address] [phone]   [facility] [address], [place] [phone]   [facility]    [phone]   [facility]    [phone]   [facility]  [address], [place] Phone:  [phone], Fax:  [phone] Hours of Operation:  9 am - 6  pm, M-F.  Also accepts Medicaid/Medicare and self-pay.  [facility]  [address], [place] Phone: [phone], Fax: [phone]. Hours of Operation:  8:30 am - 5:30 pm, M-F.  Also accepts Medicaid and self-pay.  [facility] [place] [address], [place] Phone: [phone]) [phone]   [facility] [address], [place], [place] [phone], Ext. [phone] Mondays &  Thursdays: 7-9 AM.  First 15 patients are seen on a first come, first serve basis.    Medicaid-accepting [facility] Providers:  Organization         Address  Phone   Notes  [facility] [address], [place] [phone] Also accepts self-pay patients.  [facility] [address] Laurell Josephs [place], [place]  [phone]   [facility] [address], [place] [phone]   [facility] [address] [place] [phone]   Renaye Rakers [address], [place]   [phone] Only accepts [place] Access [place] patients after they have their name applied to their card.   Self-Pay (no insurance) in [facility]:  Organization         Address  Phone   Notes  Sickle Cell Patients, [facility] Internal Medicine [address] [place], [place] [phone]   [facility] [address] [place], [place] [phone]   [facility] [place]  [address], [place] [phone]   [facility]/Dr. Osei-Bonsu  [address], [place] or [address], [place] [phone] Phone number for both [place] and [place] locations is the same.  [facility] [address] [place] [phone]   [facility] [address], [place] or [address] [phone]   [facility] [address] [place] [phone], phone; [phone], fax Sees patients 1st and 3rd Saturday of every month.  Must not qualify for public or private insurance (i.e. Medicaid, Medicare, [place] Health Choice, Veterans' Benefits)  Household income should be no more than 200% of the poverty level The clinic cannot treat you if you are pregnant or think you are pregnant  Sexually transmitted diseases are not treated at the clinic.    Dental Care: Organization         Address  Phone  Notes  [facility] Department of [facility] [address] [place], [place] [phone] Accepts children up to age 70 who are enrolled in [place] or [place] Health Choice; pregnant women with a Medicaid card; and children who have applied for Medicaid or [place] Health Choice, but were declined, whose parents can pay a reduced fee at time of service.  [facility] Department of [facility]  [address], [place] [phone] Accepts children up to age 86 who are enrolled in [place] or [place] Health Choice; pregnant women with a Medicaid card; and children who have applied for Medicaid or [place] Health Choice, but were declined, whose parents can pay a reduced fee at time of service.  [facility]  [address] [place], [place] [phone] Patients are seen by appointment only. Walk-ins are not accepted. [facility] will see patients 75 years of age and older. Monday - Tuesday (8am-5pm) Most Wednesdays (8:30-5pm) $30 per visit, cash only  [facility] Adult Dental Access PROGRAM  [address], [facility] [phone] Patients are seen by appointment only. Walk-ins are not accepted. [facility] will see patients 47 years of age and older. One Wednesday Evening (Monthly: Volunteer Based).  $30 per visit, cash only  [employer]  [phone] for adults; Children under age  4, call Graduate Pediatric Dentistry at [phone]. Children aged 58-14, please call [phone] to request a pediatric application.  Dental services are provided in all areas of dental care including fillings, crowns and bridges, complete and partial dentures, implants, gum treatment, root canals, and extractions. Preventive care is also provided. Treatment is provided to both adults and children. Patients are selected via a lottery and there is often a waiting list.   [facility] [address] [place]  [phone] [email or web address]   [facility] [address] [place], [place] [phone], Ext. [phone] Second and Fourth Thursday of each month, opens at 6:30 AM; Clinic ends at 9 AM.  Patients are seen on a first-come first-served basis, and a limited number are seen during each clinic.   [facility]  [address] Ether Griffins [place], [place] [phone]   Eligibility Requirements You must have lived in [place], [place], or [place] counties for at least the last three months.   You cannot be eligible for state or federal sponsored [place], including [employer], [place], or [employer].   You generally cannot be eligible for healthcare insurance through your employer.    How to apply: Eligibility screenings are held every Tuesday and Wednesday afternoon from 1:00 pm until 4:00 pm. You do not need an appointment for the interview!  [facility] [address] [place], [place] [phone]   [facility] Health Department  [phone]   [facility] Health Department  [phone]   [facility] Health Department  [phone]    Behavioral Health Resources in the Community: Intensive Outpatient Programs Organization         Address  Phone  Notes  [facility] Services [address] [place], [place] [phone]   [facility] [address] [place], [place] [phone]   [facility] S[facility]  [address], [place], [place]  [phone]   [facility] [address]  [place], [place] [phone] or [phone]   Substance Abuse Resources Organization         Address  Phone  Notes  [facility] Drug Services  [phone]   [facility]  [phone]   [facility]  [phone]   Floydene Flock  [phone]   Residential & Outpatient Substance Abuse Program  [phone]   Psychological Services Organization         Address  Phone  Notes  [facility]  [phone]   [facility] Services  [phone]   [facility] Mental Health [address], [place] [phone] or [phone]    Mobile Crisis Teams Organization         Address  Phone  Notes  [facility], Mobile Crisis Care Unit  [phone]   [facility]  [address] [place], [place] [phone]   Doristine Locks [address] [place] [phone]    Self-Help/Support Groups Organization         Address  Phone             Notes  [facility] [place] - variety of support groups  [phone]- I7437963 Call for more information  Narcotics Anonymous (NA), [facility] [address], [employer] [place]  2 meetings at this location   Chief Executive Officer  Notes  [facility] [phone] Friendly  Lynne Logan [place]  [phone]   [facility]  [address], [place] [address], [place], [place] [phone]   [facility] [address] [place], [place] [phone] Admissions: 8am-3pm M-F  [facility] [address]    [place], [place] [phone]   [facility] [address] [place], [place], [place] [phone]   [facility] [address]  [place], [place] [phone]   [facility] - Intensive Outpatient [address]ure[address], [place], [place]  [phone]   [facility] [address]  [place], [place] [phone] or [phone]   [facility] ([facility]) [address], [place], [place] [phone] Accepts Medicaid  [facility] [address]  [place] [phone] Substance Abuse/Addiction Treatment   [facility] Organization         Address  Phone  Notes  [facility]  [phone]   Angie Fava, PhD [address] Ervin Knack [place], [place]   [phone] or [phone]   [facility] Behavioral   [address] [place], [place] [phone]   [facility] [address], [place], [place] [phone] Insurance/Medicaid/sponsorship through [facility] and Families [address]                                    [place], [place] [phone] Therapy/tele-psych/case  [facility] [address] [place], [place] [phone]    Dr. Lolly Mustache  [phone]   [facility]  [employer] [facility] Dept. 1) [address] [place] 2) [address], [place] 3)  [phone] [address], [place] [phone]  [phone]   [facility] Child Abuse Hotline [phone] or [phone] (After Hours)

## 2014-08-14 NOTE — ED Provider Notes (Signed)
CSN: [id number]     Arrival date & time 08/14/14  0254 History   First MD Initiated Contact with Patient 08/14/14 [phone]     Chief Complaint  Patient presents with  . Dental Pain     (Consider location/radiation/quality/duration/timing/severity/associated sxs/prior Treatment) The history is provided by the patient. No language interpreter was used.  Patrick Gilbert is a 58 year old male with no known significant past medical history presenting to the ED with dental pain that has been ongoing since the day after Thanksgiving. Patient reports that the pain is localized to the left lower jawline described as a throbbing pain that is constant with radiation to the left side of the face. Patient reported that he has not seen a dentist in a long time secondary to monetary issues. Stated that he does have poor dentition and needs multiple teeth to be removed. Stated that he's been using ibuprofen with yeast. Denied bleeding, drainage, fever, chills, neck pain, neck stiffness, blurred vision, sudden loss of vision, headache, dizziness, nausea, vomiting, chest pain, shortness of breath and difficulty breathing, difficulty swallowing, throat closing sensation. PCP none  History reviewed. No pertinent past medical history. History reviewed. No pertinent past surgical history. No family history on file. History  Substance Use Topics  . Smoking status: Current Every Day Smoker -- 1.00 packs/day  . Smokeless tobacco: Not on file  . Alcohol Use: Yes    Review of Systems  Constitutional: Negative for fever and chills.  HENT: Positive for dental problem. Negative for sore throat and trouble swallowing.   Eyes: Negative for visual disturbance.  Respiratory: Negative for chest tightness and shortness of breath.   Cardiovascular: Negative for chest pain.  Neurological: Negative for dizziness, weakness and headaches.      Allergies  Review of patient's allergies indicates no known allergies.  Home  Medications   Prior to Admission medications   Medication Sig Start Date End Date Taking? Authorizing Provider  HYDROcodone-acetaminophen (NORCO/VICODIN) 5-325 MG per tablet Take 1 tablet by mouth every 6 (six) hours as needed. 08/14/14   Maurie Olesen, PA-C  ibuprofen (ADVIL,MOTRIN) 200 MG tablet Take 400 mg by mouth daily as needed (pain).    Historical Provider, MD  meloxicam (MOBIC) 15 MG tablet Take 1 tablet (15 mg total) by mouth daily. 05/11/14   Reuben Likes, MD  penicillin v potassium (VEETID) 500 MG tablet Take 1 tablet (500 mg total) by mouth 3 (three) times daily. 08/14/14 08/21/14  Chakia Counts, PA-C  predniSONE (DELTASONE) 10 MG tablet 2 daily for 14 days, then 1 daily for 14 days 05/11/14   Reuben Likes, MD   BP 155/92 mmHg  Pulse 74  Temp(Src) 98.3 F (36.8 C) (Oral)  Resp 18  SpO2 100% Physical Exam  Constitutional: He is oriented to person, place, and time. He appears well-developed and well-nourished. No distress.  HENT:  Head: Normocephalic and atraumatic.  Mouth/Throat:    Negative facial swelling. Negative changes to skin color or erythema identified. Negative warmth upon palpation.  Poor dentition identified with numerous teeth missing from maxillary and mandibular jawline. Remaining teeth are decayed and diagrammed. Beginnings of gingivitis identified. Plaque surrounding the teeth - opaque in color. First premolar of the left mandibular jawline identified to be diagrammed in decaying process with negative abscess surrounding the gumline. Negative erythema. Negative drainage or bleeding identified. Discomfort upon palpation. Negative drainable abscess identified-negative palpation of fluctuation or induration identified on exam. Uvula midline with symmetrical elevation. Negative trismus. Negative sublingual lesions  identified.  Eyes: Conjunctivae and EOM are normal. Pupils are equal, round, and reactive to light. Right eye exhibits no discharge. Left eye  exhibits no discharge.  Neck: Normal range of motion. Neck supple. No tracheal deviation present.  Negative neck stiffness Negative nuchal rigidity Negative cervical lymphadenopathy Negative meningeal signs  Cardiovascular: Normal rate, regular rhythm and normal heart sounds.  Exam reveals no friction rub.   No murmur heard. Pulmonary/Chest: Effort normal and breath sounds normal. No respiratory distress. He has no wheezes. He has no rales.  Patient is able to speak in full sentences without difficulty Negative use of accessory muscles Negative stridor  Musculoskeletal: Normal range of motion.  Lymphadenopathy:    He has no cervical adenopathy.  Neurological: He is alert and oriented to person, place, and time. No cranial nerve deficit. He exhibits normal muscle tone. Coordination normal.  Skin: Skin is warm and dry. No rash noted. He is not diaphoretic. No erythema.  Psychiatric: He has a normal mood and affect. His behavior is normal. Thought content normal.  Nursing note and vitals reviewed.   ED Course  Procedures (including critical care time) Labs Review Labs Reviewed - No data to display  Imaging Review No results found.   EKG Interpretation None      MDM   Final diagnoses:  Pain, dental  Gingivitis    Medications - No data to display  Filed Vitals:   08/14/14 0852  BP: 155/92  Pulse: 74  Temp: 98.3 F (36.8 C)  TempSrc: Oral  Resp: 18  SpO2: 100%   Patient presenting to the ED with dental pain. On physical exam, poor dentition identified with numerous dental caries, diagrammed teeth, decaying teeth. Beginnings of gingivitis identified. Negative findings of drainable abscess-no induration or fluctuance identified. Negative trismus. Doubt peritonsillar abscess. Doubt retropharyngeal abscess. Doubt Ludwig's angina. Patient stable, afebrile. Patient not septic appearing. Discharged patient. Discharge patient with antibiotics and pain medications-discussed  course, precautions, disposal technique. Discussed with patient to follow-up with dentist-referrals given. Resource guide given and discharge paperwork. Discussed with patient to apply warm compressions. Discussed with patient to closely monitor symptoms and if symptoms are to worsen or change to report back to the ED - strict return instructions given.  Patient agreed to plan of care, understood, all questions answered.   Raymon MuttonMarissa Caroline Matters, PA-C 08/14/14 [phone]  Ward GivensIva L Knapp, MD 08/14/14 [phone]

## 2015-02-28 ENCOUNTER — Emergency Department (HOSPITAL_COMMUNITY)
Admission: EM | Admit: 2015-02-28 | Discharge: 2015-02-28 | Disposition: A | Discharge status: Home / Self Care | Payer: PRIVATE HEALTH INSURANCE | Attending: Emergency Medicine | Admitting: Emergency Medicine

## 2015-02-28 ENCOUNTER — Encounter (HOSPITAL_COMMUNITY): Payer: Self-pay | Admitting: Emergency Medicine

## 2015-02-28 ENCOUNTER — Emergency Department (HOSPITAL_COMMUNITY): Payer: PRIVATE HEALTH INSURANCE

## 2015-02-28 DIAGNOSIS — M25552 Pain in left hip: Secondary | ICD-10-CM

## 2015-02-28 DIAGNOSIS — M1612 Unilateral primary osteoarthritis, left hip: Secondary | ICD-10-CM

## 2015-02-28 DIAGNOSIS — M79605 Pain in left leg: Secondary | ICD-10-CM | POA: Diagnosis present

## 2015-02-28 DIAGNOSIS — M6528 Calcific tendinitis, other site: Secondary | ICD-10-CM | POA: Insufficient documentation

## 2015-02-28 DIAGNOSIS — Z720 Tobacco use: Secondary | ICD-10-CM | POA: Insufficient documentation

## 2015-02-28 DIAGNOSIS — M6520 Calcific tendinitis, unspecified site: Secondary | ICD-10-CM

## 2015-02-28 MED ORDER — MELOXICAM 15 MG PO TABS: 15.0000 mg | ORAL_TABLET | Freq: Every day | ORAL | Status: DC

## 2015-02-28 NOTE — Discharge Instructions (Signed)
1. Medications: Mobic, usual home medications 2. Treatment: rest, drink plenty of fluids, gentle stretching 3. Follow Up: Please followup with the [facility] to establish regular medical care and follow-up on your hip pain.  Please contact Dr. August Saucer for persistent knee pain.  Follow-up in the ED if pain worsens or you experience numbness, weakness of loss of bowel or bladder control.      Hip Pain Your hip is the joint between your upper legs and your lower pelvis. The bones, cartilage, tendons, and muscles of your hip joint perform a lot of work each day supporting your body weight and allowing you to move around. Hip pain can range from a minor ache to severe pain in one or both of your hips. Pain may be felt on the inside of the hip joint near the groin, or the outside near the buttocks and upper thigh. You may have swelling or stiffness as well.  HOME CARE INSTRUCTIONS   Take medicines only as directed by your health care provider.  Apply ice to the injured area:  Put ice in a plastic bag.  Place a towel between your skin and the bag.  Leave the ice on for 15-20 minutes at a time, 3-4 times a day.  Keep your leg raised (elevated) when possible to lessen swelling.  Avoid activities that cause pain.  Follow specific exercises as directed by your health care provider.  Sleep with a pillow between your legs on your most comfortable side.  Record how often you have hip pain, the location of the pain, and what it feels like. SEEK MEDICAL CARE IF:   You are unable to put weight on your leg.  Your hip is red or swollen or very tender to touch.  Your pain or swelling continues or worsens after 1 week.  You have increasing difficulty walking.  You have a fever. SEEK IMMEDIATE MEDICAL CARE IF:   You have fallen.  You have a sudden increase in pain and swelling in your hip. MAKE SURE YOU:   Understand these instructions.  Will watch your condition.  Will get help  right away if you are not doing well or get worse. Document Released: 02/19/2010 Document Revised: 01/16/2014 Document Reviewed: 04/28/2013 [facility] Patient Information 2015 [place], [place]. This information is not intended to replace advice given to you by your health care provider. Make sure you discuss any questions you have with your health care provider.

## 2015-02-28 NOTE — ED Provider Notes (Signed)
CSN: [id number]     Arrival date & time 02/28/15  1417 History   First MD Initiated Contact with Patient 02/28/15 1518     Chief Complaint  Patient presents with  . Leg Pain     (Consider location/radiation/quality/duration/timing/severity/associated sxs/prior Treatment) Patient is a 59 y.o. male presenting with leg pain. The history is provided by the patient and medical records. No language interpreter was used.  Leg Pain Associated symptoms: no back pain, no fatigue and no fever      Patrick Gilbert is a 59 y.o. male  with a hx of arthritis (shoulders) presents to the Emergency Department complaining of gradual, persistent, progressively worsening left leg pain onset 1 month ago. Pt describes the pain as an aching in his groin that radiates to his left knee.  He reports this aching is the same as the arthritis in his shoulder.  He reports that sometimes he feels as if there is a "catch" in his hip/groin.  He reports the pain at worse at night and when walking to the store.  Pt reports he is taking Ibuprofen  1x per day with total relief for several hours afterwards.  Pt reports he injured his groin several years ago after jumping from a truck.  He reports he had an x-ray at the time of the injury, but has not sought medical attention since that time.  Pt denies low back pain, loss of bowel or bladder control, numbness, weakness, gait disturbance.    History reviewed. No pertinent past medical history. History reviewed. No pertinent past surgical history. History reviewed. No pertinent family history. History  Substance Use Topics  . Smoking status: Current Every Day Smoker -- 1.00 packs/day  . Smokeless tobacco: Not on file  . Alcohol Use: Yes    Review of Systems  Constitutional: Negative for fever, diaphoresis, appetite change, fatigue and unexpected weight change.  HENT: Negative for mouth sores.   Eyes: Negative for visual disturbance.  Respiratory: Negative for cough, chest  tightness, shortness of breath and wheezing.   Cardiovascular: Negative for chest pain.  Gastrointestinal: Negative for nausea, vomiting, abdominal pain, diarrhea and constipation.  Endocrine: Negative for polydipsia, polyphagia and polyuria.  Genitourinary: Negative for dysuria, urgency, frequency and hematuria.  Musculoskeletal: Positive for arthralgias. Negative for back pain and neck stiffness.  Skin: Negative for rash.  Allergic/Immunologic: Negative for immunocompromised state.  Neurological: Negative for syncope, light-headedness and headaches.  Hematological: Does not bruise/bleed easily.  Psychiatric/Behavioral: Negative for sleep disturbance. The patient is not nervous/anxious.       Allergies  Review of patient's allergies indicates no known allergies.  Home Medications   Prior to Admission medications   Medication Sig Start Date End Date Taking? Authorizing Provider  HYDROcodone-acetaminophen (NORCO/VICODIN) 5-325 MG per tablet Take 1 tablet by mouth every 6 (six) hours as needed. 08/14/14   Marissa Sciacca, PA-C  ibuprofen (ADVIL,MOTRIN) 200 MG tablet Take 400 mg by mouth daily as needed (pain).    Historical Provider, MD  meloxicam (MOBIC) 15 MG tablet Take 1 tablet (15 mg total) by mouth daily. 02/28/15   Milo Solana, PA-C  predniSONE (DELTASONE) 10 MG tablet 2 daily for 14 days, then 1 daily for 14 days 05/11/14   Reuben Likes, MD   BP 141/98 mmHg  Pulse 68  Temp(Src) 98.3 F (36.8 C) (Oral)  Resp 16  SpO2 100% Physical Exam  Constitutional: He appears well-developed and well-nourished. No distress.  HENT:  Head: Normocephalic and atraumatic.  Eyes:  Conjunctivae are normal.  Neck: Normal range of motion.  Cardiovascular: Normal rate, regular rhythm, normal heart sounds and intact distal pulses.   No murmur heard. Capillary refill < 3 sec  Pulmonary/Chest: Effort normal and breath sounds normal.  Musculoskeletal: He exhibits tenderness. He exhibits no  edema.  ROM: Full range of motion of the left hip without crepitus, full range of motion of the left knee and left ankle in all toes of the left foot without pain Deformity, erythema or swelling of any joint on the bilateral lower extremities  Neurological: He is alert. Coordination normal.  Sensation intact to dull and sharp in the bilateral lower extremities Strength 5/5 in the bilateral lower extremities including resisted flexion and extension of the ankles and knees, abduction and adduction and flexion and extension of the bilateral hips Pt ambulates without gait disturbance.  Skin: Skin is warm and dry. He is not diaphoretic.  No tenting of the skin  Psychiatric: He has a normal mood and affect.  Nursing note and vitals reviewed.   ED Course  Procedures (including critical care time) Labs Review Labs Reviewed - No data to display  Imaging Review Dg Hip Unilat With Pelvis 2-3 Views Left  02/28/2015   CLINICAL DATA:  Intermittent left hip pain for about a month, no known injury  EXAM: LEFT HIP (WITH PELVIS) 2-3 VIEWS  COMPARISON:  None.  FINDINGS: Three views of the left hip submitted. No acute fracture or subluxation. There are mild degenerative changes with mild narrowing of superior hip joint space. There is a spike like elevated calcification along the iliopsoas insertion on the lesser trochanter. This is consistent with calcific tendinitis.  IMPRESSION: No acute fracture or subluxation. Mild degenerative changes with narrowing of superior hip joint space. Probable calcific tendinitis ileal psoas muscle along insertion on lesser trochanter.   Electronically Signed   By: Natasha Mead M.D.   On: 02/28/2015 16:32     EKG Interpretation None      MDM   Final diagnoses:  Arthralgia of left hip  Arthritis of left hip  Calcific tendinitis   Patrick Gilbert resents with left hip pain that radiates down to his left knee. Full range of motion and normal strength in all joints the  bilateral lower extremities. Suspect osteoarthritis however patient does not have primary care. Will obtain plain films.  Patient X-Ray negative for obvious fracture or dislocation. Films show mild degenerative changes and evidence of calcific tendinitis of the iliopsoas muscle likely cause of patient's pain. Pt advised to follow up with orthopedics and primary care for further evaluation and treatment.Conservative therapy recommended and discussed. Patient will be dc home anti-inflammatory & is agreeable with above plan.  BP 141/98 mmHg  Pulse 68  Temp(Src) 98.3 F (36.8 C) (Oral)  Resp 16  SpO2 100%    Dierdre Forth, PA-C 02/28/15 2320  Toy Cookey, MD 03/01/15 [phone]

## 2015-02-28 NOTE — ED Notes (Signed)
Pt sts left leg and knee pain that is chronic in nature and worse with movement and positioning

## 2015-05-15 ENCOUNTER — Ambulatory Visit
Admission: RE | Admit: 2015-05-15 | Discharge: 2015-05-15 | Disposition: A | Discharge status: Home / Self Care | Payer: PRIVATE HEALTH INSURANCE | Source: Ambulatory Visit | Attending: Physician Assistant | Admitting: Physician Assistant

## 2015-05-15 ENCOUNTER — Other Ambulatory Visit: Payer: Self-pay | Admitting: Physician Assistant

## 2015-05-15 DIAGNOSIS — R52 Pain, unspecified: Secondary | ICD-10-CM

## 2015-08-13 ENCOUNTER — Encounter (HOSPITAL_COMMUNITY): Payer: Self-pay | Admitting: *Deleted

## 2015-08-13 ENCOUNTER — Emergency Department (HOSPITAL_COMMUNITY)
Admission: EM | Admit: 2015-08-13 | Discharge: 2015-08-13 | Disposition: A | Discharge status: Home / Self Care | Payer: PRIVATE HEALTH INSURANCE | Attending: Emergency Medicine | Admitting: Emergency Medicine

## 2015-08-13 ENCOUNTER — Emergency Department (HOSPITAL_COMMUNITY): Payer: PRIVATE HEALTH INSURANCE

## 2015-08-13 DIAGNOSIS — R61 Generalized hyperhidrosis: Secondary | ICD-10-CM | POA: Diagnosis not present

## 2015-08-13 DIAGNOSIS — R1031 Right lower quadrant pain: Secondary | ICD-10-CM | POA: Diagnosis present

## 2015-08-13 DIAGNOSIS — R110 Nausea: Secondary | ICD-10-CM | POA: Diagnosis not present

## 2015-08-13 DIAGNOSIS — R079 Chest pain, unspecified: Secondary | ICD-10-CM | POA: Diagnosis not present

## 2015-08-13 DIAGNOSIS — R6883 Chills (without fever): Secondary | ICD-10-CM | POA: Diagnosis not present

## 2015-08-13 DIAGNOSIS — R42 Dizziness and giddiness: Secondary | ICD-10-CM | POA: Diagnosis not present

## 2015-08-13 DIAGNOSIS — R140 Abdominal distension (gaseous): Secondary | ICD-10-CM | POA: Insufficient documentation

## 2015-08-13 DIAGNOSIS — R1084 Generalized abdominal pain: Secondary | ICD-10-CM | POA: Insufficient documentation

## 2015-08-13 DIAGNOSIS — F17200 Nicotine dependence, unspecified, uncomplicated: Secondary | ICD-10-CM | POA: Insufficient documentation

## 2015-08-13 LAB — URINE MICROSCOPIC-ADD ON

## 2015-08-13 LAB — COMPREHENSIVE METABOLIC PANEL
ALBUMIN: 4.1 g/dL (ref 3.5–5.0)
ALT: 22 U/L (ref 17–63)
ANION GAP: 7 (ref 5–15)
AST: 18 U/L (ref 15–41)
Alkaline Phosphatase: 49 U/L (ref 38–126)
BUN: 10 mg/dL (ref 6–20)
CHLORIDE: 106 mmol/L (ref 101–111)
CO2: 24 mmol/L (ref 22–32)
Calcium: 9.5 mg/dL (ref 8.9–10.3)
Creatinine, Ser: 0.99 mg/dL (ref 0.61–1.24)
GFR calc Af Amer: >60 mL/min (ref >60)
GFR calc non Af Amer: >60 mL/min (ref >60)
Glucose, Bld: 90 mg/dL (ref 65–99)
POTASSIUM: 3.9 mmol/L (ref 3.5–5.1)
SODIUM: 137 mmol/L (ref 135–145)
Total Bilirubin: 0.9 mg/dL (ref 0.3–1.2)
Total Protein: 6.9 g/dL (ref 6.5–8.1)

## 2015-08-13 LAB — LIPASE, BLOOD: LIPASE: 35 U/L (ref 11–51)

## 2015-08-13 LAB — CBC
HEMATOCRIT: 47.4 % (ref 39.0–52.0)
HEMOGLOBIN: 15.9 g/dL (ref 13.0–17.0)
MCH: 29.3 pg (ref 26.0–34.0)
MCHC: 33.5 g/dL (ref 30.0–36.0)
MCV: 87.5 fL (ref 78.0–100.0)
PLATELETS: 195 10*3/uL (ref 150–400)
RBC: 5.42 MIL/uL (ref 4.22–5.81)
RDW: 13.9 % (ref 11.5–15.5)
WBC: 4.9 10*3/uL (ref 4.0–10.5)

## 2015-08-13 LAB — URINALYSIS, ROUTINE W REFLEX MICROSCOPIC
Bilirubin Urine: NEGATIVE
GLUCOSE, UA: NEGATIVE mg/dL
Ketones, ur: NEGATIVE mg/dL
LEUKOCYTES UA: NEGATIVE
Nitrite: NEGATIVE
PH: 6 (ref 5.0–8.0)
PROTEIN: NEGATIVE mg/dL
SPECIFIC GRAVITY, URINE: 1.018 (ref 1.005–1.030)

## 2015-08-13 LAB — I-STAT TROPONIN, ED: Troponin i, poc: 0.01 ng/mL (ref 0.00–0.08)

## 2015-08-13 MED ORDER — ONDANSETRON HCL 4 MG/2ML IJ SOLN
4.0000 mg | Freq: Once | INTRAMUSCULAR | Status: AC
  Administered 2015-08-13: 4 mg via INTRAVENOUS
  Filled 2015-08-13: qty 2

## 2015-08-13 MED ORDER — PROMETHAZINE HCL 25 MG PO TABS: 25.0000 mg | ORAL_TABLET | Freq: Four times a day (QID) | ORAL | Status: DC | PRN

## 2015-08-13 MED ORDER — SODIUM CHLORIDE 0.9 % IV BOLUS (SEPSIS)
1000.0000 mL | Freq: Once | INTRAVENOUS | Status: AC
Start: 2015-08-13 — End: 2015-08-13
  Administered 2015-08-13: 1000 mL via INTRAVENOUS

## 2015-08-13 NOTE — ED Notes (Signed)
Pt reports RLQ pain since Thursday night. Last bm was today, denies vomiting. Also reports congestion and goes to clear his throat and has bloody sputum. No acute distress noted at triage.

## 2015-08-13 NOTE — Discharge Instructions (Signed)

## 2015-08-13 NOTE — ED Notes (Signed)
Pt stable, ambulatory, states understanding of discharge instructions 

## 2015-08-13 NOTE — ED Provider Notes (Signed)
CSN: [id number]     Arrival date & time 08/13/15  1118 History   First MD Initiated Contact with Patient 08/13/15 1531     Chief Complaint  Patient presents with  . Abdominal Pain     (Consider location/radiation/quality/duration/timing/severity/associated sxs/prior Treatment) HPI Patient is a 59 year old male with no significant past history who presents to the emergency department with abdominal pain. Reports right lower quadrant abdominal pain that started 5 days ago, describes as dull, and intermittent, radiates diffusely throughout his abdomen and up his center of his chest. Associated with nausea, diaphoresis, lightheadedness, generalized fatigue. Reports that ibuprofen will improvethe pain for a short time. Nothing worsens the pain. Denies fevers, chills, weight loss, diarrhea, blood in his stool, dysuria, hematuria. No prior history of kidney stones. Abdominal pain has improved over the past couple of days but continues to have nausea with lightheadedness. No prior abdominal surgery. No sick contacts.   History reviewed. No pertinent past medical history. History reviewed. No pertinent past surgical history. History reviewed. No pertinent family history. Social History  Substance Use Topics  . Smoking status: Current Every Day Smoker -- 1.00 packs/day  . Smokeless tobacco: None  . Alcohol Use: Yes    Review of Systems  Constitutional: Positive for diaphoresis. Negative for appetite change.  HENT: Negative for congestion and trouble swallowing.   Eyes: Negative for visual disturbance.  Respiratory: Positive for chest tightness. Negative for wheezing.   Cardiovascular: Negative for palpitations.  Gastrointestinal: Positive for abdominal distention. Negative for blood in stool.  Genitourinary: Negative for frequency, flank pain, decreased urine volume and scrotal swelling.  Musculoskeletal: Negative for back pain and neck pain.  Skin: Negative for rash.  Neurological: Positive  for light-headedness. Negative for dizziness, seizures, weakness and headaches.  Psychiatric/Behavioral: Negative for behavioral problems.      Allergies  Review of patient's allergies indicates no known allergies.  Home Medications   Prior to Admission medications   Medication Sig Start Date End Date Taking? Authorizing Provider  ibuprofen (ADVIL,MOTRIN) 200 MG tablet Take 400 mg by mouth daily as needed (pain).   Yes Historical Provider, MD  HYDROcodone-acetaminophen (NORCO/VICODIN) 5-325 MG per tablet Take 1 tablet by mouth every 6 (six) hours as needed. Patient not taking: Reported on 08/13/2015 08/14/14   Marissa Sciacca, PA-C  meloxicam (MOBIC) 15 MG tablet Take 1 tablet (15 mg total) by mouth daily. Patient not taking: Reported on 08/13/2015 02/28/15   Dahlia Client Muthersbaugh, PA-C  predniSONE (DELTASONE) 10 MG tablet 2 daily for 14 days, then 1 daily for 14 days Patient not taking: Reported on 08/13/2015 05/11/14   Reuben Likes, MD  promethazine (PHENERGAN) 25 MG tablet Take 1 tablet (25 mg total) by mouth every 6 (six) hours as needed for nausea or vomiting. 08/13/15   Corena Herter, MD   BP 173/110 mmHg  Pulse 65  Temp(Src) 98.5 F (36.9 C) (Oral)  Resp 20  Ht  (1.676 m)  Wt 89.359 kg  BMI 31.81 kg/m2  SpO2 95% Physical Exam  Constitutional: He is oriented to person, place, and time. He appears well-developed and well-nourished. No distress.  HENT:  Head: Atraumatic.  Mouth/Throat: Oropharynx is clear and moist.  Eyes: Conjunctivae and EOM are normal. Pupils are equal, round, and reactive to light.  Neck: Normal range of motion. Neck supple. No JVD present. No tracheal deviation present.  Cardiovascular: Normal rate, regular rhythm, normal heart sounds and intact distal pulses.   Pulmonary/Chest: Effort normal and breath sounds normal. No  respiratory distress. He has no wheezes. He exhibits no tenderness.  Abdominal: Soft. Bowel sounds are normal. He exhibits  distension. He exhibits no pulsatile midline mass. There is no tenderness. There is no rigidity, no guarding, no CVA tenderness, no tenderness at McBurney's point and negative Murphy's sign. No hernia.  Musculoskeletal: Normal range of motion.  Neurological: He is alert and oriented to person, place, and time.  Skin: Skin is warm.  Psychiatric: He has a normal mood and affect.  Nursing note and vitals reviewed.   ED Course  Procedures (including critical care time) Labs Review Labs Reviewed  URINALYSIS, ROUTINE W REFLEX MICROSCOPIC (NOT AT [facility]) - Abnormal; Notable for the following:    Hgb urine dipstick TRACE (*)    All other components within normal limits  URINE MICROSCOPIC-ADD ON - Abnormal; Notable for the following:    Squamous Epithelial / LPF 0-5 (*)    Bacteria, UA RARE (*)    All other components within normal limits  LIPASE, BLOOD  COMPREHENSIVE METABOLIC PANEL  CBC  I-STAT TROPOININ, ED    Imaging Review Dg Chest 2 View  08/13/2015  CLINICAL DATA:  Right-sided abdominal pain after drinking alcohol and eating. Currently dizziness with nausea but no pain. EXAM: CHEST  2 VIEW COMPARISON:  09/09/2012 FINDINGS: Lungs are adequately inflated without consolidation or effusion. Cardiomediastinal silhouette is within normal. There are mild degenerative changes of the spine. IMPRESSION: No active cardiopulmonary disease. Electronically Signed   By: Elberta Fortis M.D.   On: 08/13/2015 17:01   I have personally reviewed and evaluated these images and lab results as part of my medical decision-making.   EKG Interpretation   Date/Time:  Monday August 13 2015 16:16:10 EST Ventricular Rate:  67 PR Interval:  199 QRS Duration: 81 QT Interval:  388 QTC Calculation: 410 R Axis:   66 Text Interpretation:  Sinus rhythm Right atrial enlargement Probable left  ventricular hypertrophy Borderline T abnormalities, inferior leads No old  tracing to compare Confirmed by Mirian Mo  [phone]) on 08/13/2015  4:29:19 PM      MDM   Final diagnoses:  Generalized abdominal pain  Nausea   Patient is a 59 year old male with no significant past history who presents to the emergency department with upper abdominal pain with radiation to the chest associated with nausea and diaphoresis. On arrival no acute distress, not ill appearing. Afebrile, hemodynamically stable. Exam as above, notable for lungs clear to auscultation bilaterally, intact distal pulses, benign abdominal exam.  Patient given IV fluids and Zofran for nausea. Labs including CBC, CMP, lipase, UA showed no acute findings.   HEART score low risk. Doubt dissection, intact distal pulses, no radiation to back, no h/o hypertension. Patient's clinical picture and workup is not consistent with emergent intra-abdominal process, doubt perforation, pancreatitis, diverticulitis, SBO, acute cholecystitis, mesenteric ischemia, pyelonephritis, appendicitis.  EKG showed normal sinus rhythm, normal intervals, no chamber enlargement, no signs of ischemia, no prior EKG to compare. Initial troponin negative. Chest x-ray showed no acute findings. Given that the patient is a low risk HEART score wanted to obtain delta troponin. However, patient reports that his ride has to go to work and that he would not wish to stay for the repeat troponin.  Patient currently chest pain free.  Discussed follow up with his primary care physician in the next 2 days. Discussed strict return precautions for chest pain or worsening of symptoms. Patient is stable for discharge. No questions or concerns at time of discharge.  Corena Herter, MD 08/14/15 [id number]  Mirian Mo, MD 08/14/15 2114

## 2016-03-15 DIAGNOSIS — R519 Headache, unspecified: Secondary | ICD-10-CM

## 2016-03-15 HISTORY — DX: Headache, unspecified: R51.9

## 2016-03-27 ENCOUNTER — Encounter (HOSPITAL_COMMUNITY): Payer: Self-pay | Admitting: Emergency Medicine

## 2016-03-27 ENCOUNTER — Emergency Department (HOSPITAL_COMMUNITY): Payer: PRIVATE HEALTH INSURANCE

## 2016-03-27 ENCOUNTER — Observation Stay (HOSPITAL_COMMUNITY)
Admission: EM | Admit: 2016-03-27 | Discharge: 2016-03-28 | Disposition: A | Discharge status: Home / Self Care | Payer: PRIVATE HEALTH INSURANCE | Attending: Family Medicine | Admitting: Family Medicine

## 2016-03-27 ENCOUNTER — Observation Stay (HOSPITAL_COMMUNITY): Payer: PRIVATE HEALTH INSURANCE

## 2016-03-27 DIAGNOSIS — F17200 Nicotine dependence, unspecified, uncomplicated: Secondary | ICD-10-CM | POA: Diagnosis not present

## 2016-03-27 DIAGNOSIS — R51 Headache: Secondary | ICD-10-CM

## 2016-03-27 DIAGNOSIS — N179 Acute kidney failure, unspecified: Secondary | ICD-10-CM

## 2016-03-27 DIAGNOSIS — R079 Chest pain, unspecified: Secondary | ICD-10-CM | POA: Diagnosis present

## 2016-03-27 DIAGNOSIS — E860 Dehydration: Secondary | ICD-10-CM | POA: Diagnosis not present

## 2016-03-27 DIAGNOSIS — R9431 Abnormal electrocardiogram [ECG] [EKG]: Secondary | ICD-10-CM | POA: Diagnosis not present

## 2016-03-27 DIAGNOSIS — I10 Essential (primary) hypertension: Secondary | ICD-10-CM | POA: Diagnosis not present

## 2016-03-27 DIAGNOSIS — Z720 Tobacco use: Secondary | ICD-10-CM

## 2016-03-27 DIAGNOSIS — R519 Headache, unspecified: Secondary | ICD-10-CM | POA: Insufficient documentation

## 2016-03-27 DIAGNOSIS — R9089 Other abnormal findings on diagnostic imaging of central nervous system: Secondary | ICD-10-CM | POA: Insufficient documentation

## 2016-03-27 HISTORY — DX: Pure hypercholesterolemia, unspecified: E78.00

## 2016-03-27 HISTORY — DX: Essential (primary) hypertension: I10

## 2016-03-27 HISTORY — DX: Headache: R51

## 2016-03-27 LAB — CBC
HEMATOCRIT: 49 % (ref 39.0–52.0)
Hemoglobin: 16.5 g/dL (ref 13.0–17.0)
MCH: 28.9 pg (ref 26.0–34.0)
MCHC: 33.7 g/dL (ref 30.0–36.0)
MCV: 86 fL (ref 78.0–100.0)
PLATELETS: 215 10*3/uL (ref 150–400)
RBC: 5.7 MIL/uL (ref 4.22–5.81)
RDW: 13.3 % (ref 11.5–15.5)
WBC: 5.7 10*3/uL (ref 4.0–10.5)

## 2016-03-27 LAB — COMPREHENSIVE METABOLIC PANEL
ALBUMIN: 4.6 g/dL (ref 3.5–5.0)
ALK PHOS: 52 U/L (ref 38–126)
ALT: 30 U/L (ref 17–63)
AST: 25 U/L (ref 15–41)
Anion gap: 12 (ref 5–15)
BILIRUBIN TOTAL: 1 mg/dL (ref 0.3–1.2)
BUN: 16 mg/dL (ref 6–20)
CO2: 21 mmol/L — ABNORMAL LOW (ref 22–32)
CREATININE: 1.43 mg/dL — AB (ref 0.61–1.24)
Calcium: 9.9 mg/dL (ref 8.9–10.3)
Chloride: 104 mmol/L (ref 101–111)
GFR calc Af Amer: >60 mL/min (ref >60)
GFR calc non Af Amer: 52 mL/min — ABNORMAL LOW (ref >60)
GLUCOSE: 119 mg/dL — AB (ref 65–99)
POTASSIUM: 3.7 mmol/L (ref 3.5–5.1)
Sodium: 137 mmol/L (ref 135–145)
TOTAL PROTEIN: 7.7 g/dL (ref 6.5–8.1)

## 2016-03-27 LAB — TSH: TSH: 0.996 u[IU]/mL (ref 0.350–4.500)

## 2016-03-27 LAB — I-STAT TROPONIN, ED: Troponin i, poc: 0.01 ng/mL (ref 0.00–0.08)

## 2016-03-27 LAB — TROPONIN I
Troponin I: <0.03 ng/mL (ref <0.03)
Troponin I: <0.03 ng/mL (ref <0.03)

## 2016-03-27 LAB — CBG MONITORING, ED: GLUCOSE-CAPILLARY: 134 mg/dL — AB (ref 65–99)

## 2016-03-27 LAB — LIPASE, BLOOD: Lipase: 47 U/L (ref 11–51)

## 2016-03-27 LAB — CK: CK TOTAL: 317 U/L (ref 49–397)

## 2016-03-27 MED ORDER — METOCLOPRAMIDE HCL 5 MG/ML IJ SOLN
10.0000 mg | Freq: Once | INTRAMUSCULAR | Status: AC
  Administered 2016-03-27: 10 mg via INTRAVENOUS
  Filled 2016-03-27: qty 2

## 2016-03-27 MED ORDER — ENOXAPARIN SODIUM 40 MG/0.4ML ~~LOC~~ SOLN: 40.0000 mg | SUBCUTANEOUS | Status: DC

## 2016-03-27 MED ORDER — SODIUM CHLORIDE 0.9 % IV BOLUS (SEPSIS)
1000.0000 mL | Freq: Once | INTRAVENOUS | Status: AC
  Administered 2016-03-27: 1000 mL via INTRAVENOUS

## 2016-03-27 MED ORDER — DIPHENHYDRAMINE HCL 50 MG/ML IJ SOLN
12.5000 mg | Freq: Once | INTRAMUSCULAR | Status: AC
  Administered 2016-03-27: 12.5 mg via INTRAVENOUS
  Filled 2016-03-27: qty 1

## 2016-03-27 MED ORDER — ASPIRIN 325 MG PO TABS
325.0000 mg | ORAL_TABLET | Freq: Once | ORAL | Status: AC
  Administered 2016-03-27: 325 mg via ORAL
  Filled 2016-03-27: qty 1

## 2016-03-27 MED ORDER — SODIUM CHLORIDE 0.9 % IV SOLN
INTRAVENOUS | Status: DC
  Administered 2016-03-27: 16:00:00 via INTRAVENOUS
  Administered 2016-03-28: 125 mL/h via INTRAVENOUS

## 2016-03-27 MED ORDER — ASPIRIN EC 81 MG PO TBEC
81.0000 mg | DELAYED_RELEASE_TABLET | Freq: Every day | ORAL | Status: DC
  Administered 2016-03-28: 81 mg via ORAL
  Filled 2016-03-27: qty 1

## 2016-03-27 MED ORDER — SODIUM CHLORIDE 0.9% FLUSH
3.0000 mL | Freq: Two times a day (BID) | INTRAVENOUS | Status: DC
  Administered 2016-03-27 (×2): 3 mL via INTRAVENOUS

## 2016-03-27 NOTE — H&P (Signed)
[facility] Admission History and Physical Service Pager: [phone]  Patient name: Patrick Gilbert Medical record number: [id number] Date of birth: 1956/03/24 Age: 60 y.o. Gender: male  Primary Care Provider: No PCP Per Patient Consultants: None Code Status: Full  Chief Complaint: Headaches  Assessment and Plan: Patrick Gilbert is a 60 y.o. male with a past medical history significant for HTN, HLD who presented with temporal headaches in the setting of abnormal CT findings suspicious for stroke.   # Headaches, Acute onset Patient presented with new onset headaches that started 3 days ago, headaches starts around patient eye. Pain was described as sharp and unrelenting, with minimal relief with ibuprofen. Patient does not have a history of headache. Differential diagnosis would include migraines vs tension headaches vs cluster headaches vs dehydration vs temporal arteritis vs trauma vs intracranial hypertension. Less likely to be migraine, because of the nature and origin of the headaches (bilateral, no photophobia, non throbbing) patient denies nausea or triggering factors. Additionally, would not expect new onset migraine in 60 year old. Tension headaches are also a possibility, patient does a lot of manual work, and has mention muscle cramping/contractions but mostly in the hands. Temporal arteritis could be a possibility but patient is a male and relatively young. There were no trauma. Patient with history of hypertension and smoking, CT shows area of hypoattenuation that could be consistent with ischemia which could be related to symptoms. Vascular insult in the setting of uncontrolled HTN could causes headaches. Patient does work outside in the summer and dehydration would be high on our differential as a possible cause for headaches, though patient endorses good hydration.  --Admit to FMTS for further management of headaches and rule out possible stroke --MRI to  further characterize areas of hypoattenuation  --NS 125 cc/hr for possible dehydration  --Tylenol as needed for pain relief.  --Consider ESR to rule out temporal arteritis if above negative and symptoms not improving  # Rib Pain. Likely musculoskeletal in etiology, though does have new T waves inversions in limbs and lateral leads compare to previous EKG in November. Patient denies chest pain, tachycardia, numbness, tingling diaphoresis. --Aspirin 33m x1 --Start ASA 827mdaily -- Risk labs: FLP, A1c, TSH --Telemetry --Trend Troponin x3 --repeat EKG am --possible follow up with cardiology in the outpatient setting  #AKI Elevated creatinine 1.43 on admission, baseline around 1.0 most likely secondary to dehydration. Patient work outside in the summer for extended period of time. CK normal.  --NS 125 cc/hr for possible dehydration --am BMP  #Hypertension / Hyperlipidemia  Patient with history of hypertension, patient taking home  medications as prescribed. Does not appear to be on statin -Will monitor BP and give hydralazine IV 81m29mRN -Hold losartan 281m78m the setting of AKI  -Start statin as needed.   FEN/GI: Heart healthy diet Prophylaxis: lovenox  Disposition: Admit to FMTS  History of Present Illness:  Patrick DIPIEROa 59 y59. male with a past medical history significant for HTN, HLD who presented with headaches. Patient states that headaches started on Monday (7/10) while he was working outside cutting grass. Headaches started around his left eye, he describes the pain as sharp. Patient has been taking ibuprofen without relief. Headaches have been intermittent and go away on their own. However, today patient was working outside when he started to experience an unrelenting headache which made him come the the ED. Patient also mentioned bilateral hand and and flank cramps that subside after two  minutes. Patient states that both cramps and headaches are new to him. Patient denies,  any vision troubles, weakness, numbness, tingling, slurred speech, nausea, chest pain, vomiting, flushing, diarrhea. Patient with extensive smoking history and admit to quitting this past Monday.  In ED, patient had a Head CT scan which showed small focal areas of attenuation in the right occipital and right cerebellum compatible with age indeterminate ischemia.   While in radiology, the patient experienced severe cramping of his arms and flank. This prompted the EDP to obtain an EKG, which showed abnormal T waves inversion in the limb leads and lateral leads.  Review Of Systems: Per HPI otherwise the remainder of the systems were negative.  Patient Active Problem List   Diagnosis Date Noted  . Chest pain 03/27/2016  . AKI (acute kidney injury) ([place]) 03/27/2016  . Headache 03/27/2016  . Tobacco abuse 03/27/2016  . Abnormal EKG   . Cephalalgia   . Pain in the chest     Past Medical History: Past Medical History  Diagnosis Date  . High blood pressure   . High cholesterol     Past Surgical History: History reviewed. No pertinent past surgical history.  Social History: Social History  Substance Use Topics  . Smoking status: Current Every Day Smoker -- 1.00 packs/day  . Smokeless tobacco: None  . Alcohol Use: No     Comment: stopped drinking last week   Additional social history:None Please also refer to relevant sections of EMR.  Family History: Family history of CVA.   Allergies and Medications: No Known Allergies No current facility-administered medications on file prior to encounter.   Current Outpatient Prescriptions on File Prior to Encounter  Medication Sig Dispense Refill  . ibuprofen (ADVIL,MOTRIN) 200 MG tablet Take 400 mg by mouth daily as needed (pain).      Objective: BP 155/109 mmHg  Pulse 93  Temp(Src) 98.2 F (36.8 C) (Oral)  Resp 20  Ht 5' 10"  (1.778 m)  Wt 191 lb 8 oz (86.864 kg)  BMI 27.48 kg/m2  SpO2 99% Exam: Gen: Calm in bed, not in any  distress. HEENT: EOMI, PERRLA, /AT. No thyromegaly. Neuro: Awake and alert, oriented x 3. No focal deficit, cranial nerve 2-12 intact, strength+5 on the right side and +4 on the left side  Heart: S1 S2 normal, no murmur. RRR. Resp: Clear to auscultation, moving air clearly, no increase work of breathing, no wheezes, rales or rhonchi  Abd: Soft, NT/ND, BS+ and normal. Ext: normal range of motion, Psy: normal mood and affect  Labs and Imaging: CBC BMET   Recent Labs Lab 03/27/16 1038  WBC 5.7  HGB 16.5  HCT 49.0  PLT 215    Recent Labs Lab 03/27/16 1038  NA 137  K 3.7  CL 104  CO2 21*  BUN 16  CREATININE 1.43*  GLUCOSE 119*  CALCIUM 9.9     Troponin 0.01 CK 317  EKG: NSR, TWI in II, II, aVF, V5, V6 (Not present on prior EKGs)  Dg Chest 2 View  03/27/2016  CLINICAL DATA:  Left chest pain. EXAM: CHEST  2 VIEW COMPARISON:  08/13/2015 FINDINGS: The lungs appear clear. Cardiac and mediastinal contours normal. No pleural effusion identified. Thoracic spondylosis noted. IMPRESSION: 1.  No active cardiopulmonary disease is radiographically apparent. Electronically Signed   By: Van Clines M.D.   On: 03/27/2016 11:06   Ct Head Wo Contrast  03/27/2016  CLINICAL DATA:  Hypertension and headache for 2 days. EXAM: CT HEAD  WITHOUT CONTRAST TECHNIQUE: Contiguous axial images were obtained from the base of the skull through the vertex without intravenous contrast. COMPARISON:  None. FINDINGS: No evidence for acute hemorrhage, hydrocephalus, or abnormal extra-axial fluid collection. No CT evidence for mass-effect. Focal area of hypoattenuation is identified in the right occipital periventricular white matter, compatible with age-indeterminate ischemia. Similar age-indeterminate lacunar infarct is seen in the right basal ganglia. The visualized paranasal sinuses and mastoid air cells are clear. IMPRESSION: Small focal areas of hypoattenuation in the right occipital periventricular  white matter and right cerebellum are compatible with the age indeterminate ischemia. MRI could be used to determine whether these findings are acute to subacute, as clinically warranted. Electronically Signed   By: Misty Stanley M.D.   On: 03/27/2016 11:51    Marjie Skiff, MD 03/27/2016, 3:19 PM PGY-1, [place] Intern pager: [phone], text pages welcome  UPPER LEVEL ADDENDUM  I have read the above note and made revisions highlighted in blue.  Algis Greenhouse. Jerline Pain, [facility] Resident PGY-3 03/27/2016 4:54 PM

## 2016-03-27 NOTE — ED Notes (Signed)
Pt returned to room from imaging dept.  

## 2016-03-27 NOTE — ED Provider Notes (Signed)
CSN: [id number]     Arrival date & time 03/27/16  7829 History   First MD Initiated Contact with Patient 03/27/16 [phone]     Chief Complaint  Patient presents with  . Headache  . Dehydration    (Consider location/radiation/quality/duration/timing/severity/associated sxs/prior Treatment) HPI Comments: Patient with history of hypertension, hypercholesterolemia, tobacco abuse -- presents with complaint of left sided headache described as dull starting 4 days ago. Patient has never had a similar headache in the past. He denies head injury. He has had nausea but no vomiting. No light sensitivity. Patient states that he works outside but has been drinking plenty of water. He has associated mild abdominal cramping. No diarrhea or urinary symptoms. Patient denies any chest pain or shortness of breath. He has however has been having 2 days of intermittent cramps in both sides of the chest and hands that last for a short period of time and resolve. Cramping is described as sharp and severe. States he had 2 episodes yesterday and one episode this morning. Patient states that he has been more short of breath with activity over the past 1 month. Denies lower extremity swelling. No cardiac history. Onset of symptoms acute. Course is constant. Nothing makes symptoms better.  The history is provided by the patient and medical records.    Past Medical History  Diagnosis Date  . High blood pressure   . High cholesterol    History reviewed. No pertinent past surgical history. No family history on file. Social History  Substance Use Topics  . Smoking status: Current Every Day Smoker -- 1.00 packs/day  . Smokeless tobacco: None  . Alcohol Use: No     Comment: stopped drinking last week    Review of Systems  Constitutional: Negative for fever.  HENT: Negative for congestion, dental problem, rhinorrhea, sinus pressure and sore throat.   Eyes: Negative for photophobia, discharge, redness and visual  disturbance.  Respiratory: Negative for cough and shortness of breath.   Cardiovascular: Negative for chest pain.  Gastrointestinal: Positive for nausea and abdominal pain. Negative for vomiting, diarrhea and blood in stool.  Genitourinary: Negative for dysuria.  Musculoskeletal: Positive for myalgias. Negative for gait problem, neck pain and neck stiffness.  Skin: Negative for rash.  Neurological: Positive for headaches. Negative for syncope, speech difficulty, weakness, light-headedness and numbness.  Psychiatric/Behavioral: Negative for confusion.    Allergies  Review of patient's allergies indicates no known allergies.  Home Medications   Prior to Admission medications   Medication Sig Start Date End Date Taking? Authorizing Provider  HYDROcodone-acetaminophen (NORCO/VICODIN) 5-325 MG per tablet Take 1 tablet by mouth every 6 (six) hours as needed. Patient not taking: Reported on 08/13/2015 08/14/14   Marissa Sciacca, PA-C  ibuprofen (ADVIL,MOTRIN) 200 MG tablet Take 400 mg by mouth daily as needed (pain).    Historical Provider, MD  meloxicam (MOBIC) 15 MG tablet Take 1 tablet (15 mg total) by mouth daily. Patient not taking: Reported on 08/13/2015 02/28/15   Dahlia Client Muthersbaugh, PA-C  predniSONE (DELTASONE) 10 MG tablet 2 daily for 14 days, then 1 daily for 14 days Patient not taking: Reported on 08/13/2015 05/11/14   Reuben Likes, MD  promethazine (PHENERGAN) 25 MG tablet Take 1 tablet (25 mg total) by mouth every 6 (six) hours as needed for nausea or vomiting. 08/13/15   Corena Herter, MD   BP 155/100 mmHg  Pulse 88  Temp(Src) 98.3 F (36.8 C) (Oral)  Resp 18  SpO2 100%   Physical Exam  Constitutional: He is oriented to person, place, and time. He appears well-developed and well-nourished.  HENT:  Head: Normocephalic and atraumatic.  Right Ear: Tympanic membrane, external ear and ear canal normal.  Left Ear: Tympanic membrane, external ear and ear canal normal.  Nose:  Nose normal.  Mouth/Throat: Uvula is midline, oropharynx is clear and moist and mucous membranes are normal.  Eyes: Conjunctivae, EOM and lids are normal. Pupils are equal, round, and reactive to light. Right eye exhibits no discharge. Left eye exhibits no discharge.  Neck: Normal range of motion. Neck supple.  Cardiovascular: Normal rate, regular rhythm and normal heart sounds.   No murmur heard. Pulmonary/Chest: Effort normal and breath sounds normal. No respiratory distress. He has no wheezes. He has no rales.  Abdominal: Soft. He exhibits no distension. There is no tenderness. There is no rebound.  Musculoskeletal: Normal range of motion. He exhibits no edema or tenderness.       Cervical back: He exhibits normal range of motion, no tenderness and no bony tenderness.  Neurological: He is alert and oriented to person, place, and time. He has normal strength and normal reflexes. No cranial nerve deficit or sensory deficit. He exhibits normal muscle tone. He displays a negative Romberg sign. Coordination and gait normal. GCS eye subscore is 4. GCS verbal subscore is 5. GCS motor subscore is 6.  Skin: Skin is warm and dry.  Psychiatric: He has a normal mood and affect.  Nursing note and vitals reviewed.   ED Course  Procedures (including critical care time) Labs Review Labs Reviewed  COMPREHENSIVE METABOLIC PANEL - Abnormal; Notable for the following:    CO2 21 (*)    Glucose, Bld 119 (*)    Creatinine, Ser 1.43 (*)    GFR calc non Af Amer 52 (*)    All other components within normal limits  CBG MONITORING, ED - Abnormal; Notable for the following:    Glucose-Capillary 134 (*)    All other components within normal limits  LIPASE, BLOOD  CBC  URINALYSIS, ROUTINE W REFLEX MICROSCOPIC (NOT AT [facility])  Rosezena Sensor, ED    Imaging Review Dg Chest 2 View  03/27/2016  CLINICAL DATA:  Left chest pain. EXAM: CHEST  2 VIEW COMPARISON:  08/13/2015 FINDINGS: The lungs appear clear. Cardiac  and mediastinal contours normal. No pleural effusion identified. Thoracic spondylosis noted. IMPRESSION: 1.  No active cardiopulmonary disease is radiographically apparent. Electronically Signed   By: Gaylyn Rong M.D.   On: 03/27/2016 11:06   Ct Head Wo Contrast  03/27/2016  CLINICAL DATA:  Hypertension and headache for 2 days. EXAM: CT HEAD WITHOUT CONTRAST TECHNIQUE: Contiguous axial images were obtained from the base of the skull through the vertex without intravenous contrast. COMPARISON:  None. FINDINGS: No evidence for acute hemorrhage, hydrocephalus, or abnormal extra-axial fluid collection. No CT evidence for mass-effect. Focal area of hypoattenuation is identified in the right occipital periventricular white matter, compatible with age-indeterminate ischemia. Similar age-indeterminate lacunar infarct is seen in the right basal ganglia. The visualized paranasal sinuses and mastoid air cells are clear. IMPRESSION: Small focal areas of hypoattenuation in the right occipital periventricular white matter and right cerebellum are compatible with the age indeterminate ischemia. MRI could be used to determine whether these findings are acute to subacute, as clinically warranted. Electronically Signed   By: Kennith Center M.D.   On: 03/27/2016 11:51   I have personally reviewed and evaluated these images and lab results as part of my medical decision-making.  EKG Interpretation   Date/Time:  Thursday March 27 2016 09:49:42 EDT Ventricular Rate:  91 PR Interval:  142 QRS Duration: 90 QT Interval:  360 QTC Calculation: 442 R Axis:   68 Text Interpretation:  Normal sinus rhythm ST-t wave abnormality , new  Abnormal ekg Confirmed by Gerhard Munch  MD ([id number]) on 03/27/2016 9:55:22  AM       10:18 AM Patient seen and examined. EKG noted, reviewed with Dr. Fayrene Fearing. Patient currently denies any CP, SOB or other anginal equivalent. Head CT ordered.    Vital signs reviewed and are as follows: BP  155/100 mmHg  Pulse 88  Temp(Src) 98.3 F (36.8 C) (Oral)  Resp 18  SpO2 100%  11:02 AM patient had an episode of left-sided chest "cramping" that began abruptly while in x-ray. It lasted for several minutes and then has completely resolved. Repeat EKG ordered. No change.   12:27 PM HEART = 4. CT shows ? Areas of ischemia. Will admit for CP obs and further deliniation of symptoms. Pt updated, agrees with plan.   12:37 PM FPC consulted and will see patient.   MDM   Final diagnoses:  Chest pain, unspecified chest pain type  Abnormal EKG  Acute nonintractable headache, unspecified headache type   Admit for CP eval.    Renne Crigler, PA-C 03/27/16 1331  Rolland Porter, MD 03/29/16 [phone]

## 2016-03-27 NOTE — ED Notes (Signed)
Pt returned from xray, was crying, c/o cramp he got to his left rib cage while in xray. Reports has been having for the last several days. PA to bedside to assess pt, EKG obtained.

## 2016-03-27 NOTE — ED Notes (Signed)
Pt c/o headache started 4 days ago- pt has profuse diaphoresis- states "started sweating a few days ago, then the headache came" -- no relief with ibuprofen. 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

## 2016-03-27 NOTE — ED Notes (Signed)
Pt returned to room from CT

## 2016-03-28 ENCOUNTER — Other Ambulatory Visit: Payer: Self-pay | Admitting: Cardiology

## 2016-03-28 ENCOUNTER — Encounter (HOSPITAL_COMMUNITY): Payer: Self-pay | Admitting: Cardiology

## 2016-03-28 DIAGNOSIS — E7800 Pure hypercholesterolemia, unspecified: Secondary | ICD-10-CM

## 2016-03-28 DIAGNOSIS — I10 Essential (primary) hypertension: Secondary | ICD-10-CM | POA: Diagnosis not present

## 2016-03-28 DIAGNOSIS — R9431 Abnormal electrocardiogram [ECG] [EKG]: Secondary | ICD-10-CM

## 2016-03-28 DIAGNOSIS — R9089 Other abnormal findings on diagnostic imaging of central nervous system: Secondary | ICD-10-CM | POA: Insufficient documentation

## 2016-03-28 LAB — LIPID PANEL
Cholesterol: 197 mg/dL (ref 0–200)
HDL: 37 mg/dL — ABNORMAL LOW (ref >40)
LDL CALC: 141 mg/dL — AB (ref 0–99)
TRIGLYCERIDES: 96 mg/dL (ref <150)
Total CHOL/HDL Ratio: 5.3 RATIO
VLDL: 19 mg/dL (ref 0–40)

## 2016-03-28 LAB — BASIC METABOLIC PANEL
ANION GAP: 6 (ref 5–15)
BUN: 11 mg/dL (ref 6–20)
CO2: 25 mmol/L (ref 22–32)
Calcium: 8.9 mg/dL (ref 8.9–10.3)
Chloride: 104 mmol/L (ref 101–111)
Creatinine, Ser: 1.04 mg/dL (ref 0.61–1.24)
GFR calc Af Amer: >60 mL/min (ref >60)
Glucose, Bld: 96 mg/dL (ref 65–99)
POTASSIUM: 4.3 mmol/L (ref 3.5–5.1)
SODIUM: 135 mmol/L (ref 135–145)

## 2016-03-28 LAB — TROPONIN I

## 2016-03-28 LAB — HIV ANTIBODY (ROUTINE TESTING W REFLEX): HIV SCREEN 4TH GENERATION: NONREACTIVE

## 2016-03-28 LAB — HEMOGLOBIN A1C
HEMOGLOBIN A1C: 5.9 % — AB (ref 4.8–5.6)
Mean Plasma Glucose: 123 mg/dL

## 2016-03-28 LAB — SEDIMENTATION RATE: Sed Rate: 4 mm/hr (ref 0–16)

## 2016-03-28 MED ORDER — ATORVASTATIN CALCIUM 40 MG PO TABS: 40.0000 mg | ORAL_TABLET | Freq: Every day | ORAL | Status: DC

## 2016-03-28 MED ORDER — LOSARTAN POTASSIUM 25 MG PO TABS: 25.0000 mg | ORAL_TABLET | Freq: Every day | ORAL | Status: DC

## 2016-03-28 NOTE — Consult Note (Signed)
Cardiology Consult    Patient ID: Patrick Gilbert MRN: [id number], DOB/AGE: 61/30/1957   Admit date: 03/27/2016 Date of Consult: 03/28/2016  Primary Physician: Milus Height, PA  Primary Cardiologist: New Requesting Provider: Dr. Jimmey Ralph Reason for Consultation: EKG changes  Patient Profile    60 yo male with PMH of HTN/HLD/ tobacco use and headaches who presented to the [facility] ED with reports of headaches for the past 3 days.   Past Medical History   Past Medical History  Diagnosis Date  . High blood pressure   . High cholesterol   . Headache 03/2016    Past Surgical History  Procedure Laterality Date  . No past surgeries       Allergies  No Known Allergies  History of Present Illness    Patrick Gilbert is a 60 yo with PMH of HTN/HLD and tobacco use. Denies ever having had a cardiac workup in the past, having seen a cardiologist. Reports she is normally very active, and currently works in a physically demanding job. He reports never having had any dyspnea on exertion or anginal pain with work or during his regular daily activities. He presented to the [facility] ED with reports of 3 days of ongoing headaches. He was also noted to be hypertensive on arrival but reported taking his home medications including his Cozaar as prescribed. While in the ED patient experienced one episode of cramping to his upper arms and right lower quadrant itch prompted the EDP to obtain an EKG which did show T-wave inversion in inferior lateral leads which is a new finding compared to previous tracing in 2016. He denies any chest pain, palpitations, dyspnea, lightheadedness, dizziness or nausea/vomiting.   His labs showed an elevated creatinine of 1.4, stable electrolytes, along with stable CBC, troponin cycled and negative 3. Hemoglobin A1c 5.9, and lipid panel showed LDL 141 and HDL 37. X-ray was negative for acute process or edema. Was admitted to the medicine for further workup. Did have an MRI of  the brain which showed else likely related to a demyelinating or inflammatory process, a superimposed punctate area of questionable restricted diffusion in the right posterior thalamus.   Inpatient Medications    . aspirin EC  81 mg Oral Daily  . enoxaparin (LOVENOX) injection  40 mg Subcutaneous Q24H  . sodium chloride flush  3 mL Intravenous Q12H    Family History    History reviewed. No pertinent family history.  Social History    Social History   Social History  . Marital Status: Single    Spouse Name: N/A  . Number of Children: N/A  . Years of Education: N/A   Occupational History  . Not on file.   Social History Main Topics  . Smoking status: Current Every Day Smoker -- 1.00 packs/day    Types: Cigarettes  . Smokeless tobacco: Former Neurosurgeon  . Alcohol Use: No     Comment: stopped drinking last week  . Drug Use: No  . Sexual Activity: Not on file   Other Topics Concern  . Not on file   Social History Narrative     Review of Systems    General:  No chills, fever, night sweats or weight changes.  Cardiovascular:  No chest pain, dyspnea on exertion, edema, orthopnea, palpitations, paroxysmal nocturnal dyspnea. Dermatological: No rash, lesions/masses Respiratory: No cough, dyspnea Urologic: No hematuria, dysuria Abdominal:   No nausea, vomiting, diarrhea, bright red blood per rectum, melena, or hematemesis Neurologic:  No visual  changes, wkns, changes in mental status, + headache All other systems reviewed and are otherwise negative except as noted above.  Physical Exam    Blood pressure 158/101, pulse 74, temperature 98.1 F (36.7 C), temperature source Oral, resp. rate 18, height 5\' 10"  (1.778 m), weight 194 lb 3.2 oz (88.089 kg), SpO2 96 %.  General: Pleasant, NAD Psych: Normal affect. Neuro: Alert and oriented X 3. Moves all extremities spontaneously. HEENT: Normal  Neck: Supple without bruits or JVD. Lungs:  Resp regular and unlabored, CTA . Heart:  RRR no s3, s4, or murmurs. Abdomen: Soft, non-tender, non-distended, BS + x 4.  Extremities: No clubbing, cyanosis or edema. DP/PT/Radials 2+ and equal bilaterally.  Labs    Troponin [facility] Test)  Recent Labs  03/27/16 1024  TROPIPOC 0.01    Recent Labs  03/27/16 1513 03/27/16 2113 03/28/16 0312  CKTOTAL 317  --   --   TROPONINI <0.03 <0.03 <0.03   Lab Results  Component Value Date   WBC 5.7 03/27/2016   HGB 16.5 03/27/2016   HCT 49.0 03/27/2016   MCV 86.0 03/27/2016   PLT 215 03/27/2016    Recent Labs Lab 03/27/16 1038 03/28/16 0312  NA 137 135  K 3.7 4.3  CL 104 104  CO2 21* 25  BUN 16 11  CREATININE 1.43* 1.04  CALCIUM 9.9 8.9  PROT 7.7  --   BILITOT 1.0  --   ALKPHOS 52  --   ALT 30  --   AST 25  --   GLUCOSE 119* 96   Lab Results  Component Value Date   CHOL 197 03/28/2016   HDL 37* 03/28/2016   LDLCALC 141* 03/28/2016   TRIG 96 03/28/2016   No results found for: [facility]   Radiology Studies    Dg Chest 2 View  03/27/2016  CLINICAL DATA:  Left chest pain. EXAM: CHEST  2 VIEW COMPARISON:  08/13/2015 FINDINGS: The lungs appear clear. Cardiac and mediastinal contours normal. No pleural effusion identified. Thoracic spondylosis noted. IMPRESSION: 1.  No active cardiopulmonary disease is radiographically apparent. Electronically Signed   By: Gaylyn Rong M.D.   On: 03/27/2016 11:06   Ct Head Wo Contrast  03/27/2016  CLINICAL DATA:  Hypertension and headache for 2 days. EXAM: CT HEAD WITHOUT CONTRAST TECHNIQUE: Contiguous axial images were obtained from the base of the skull through the vertex without intravenous contrast. COMPARISON:  None. FINDINGS: No evidence for acute hemorrhage, hydrocephalus, or abnormal extra-axial fluid collection. No CT evidence for mass-effect. Focal area of hypoattenuation is identified in the right occipital periventricular white matter, compatible with age-indeterminate ischemia. Similar age-indeterminate lacunar  infarct is seen in the right basal ganglia. The visualized paranasal sinuses and mastoid air cells are clear. IMPRESSION: Small focal areas of hypoattenuation in the right occipital periventricular white matter and right cerebellum are compatible with the age indeterminate ischemia. MRI could be used to determine whether these findings are acute to subacute, as clinically warranted. Electronically Signed   By: Kennith Center M.D.   On: 03/27/2016 11:51   Mr Brain Wo Contrast  03/27/2016  CLINICAL DATA:  60 year old male with left side headache with nausea. Abnormal appearance of the right cerebellum on head CT today. Initial encounter. EXAM: MRI HEAD WITHOUT CONTRAST TECHNIQUE: Multiplanar, multiecho pulse sequences of the brain and surrounding structures were obtained without intravenous contrast. COMPARISON:  Head CT without contrast 1135 hours today. FINDINGS: Cerebral volume appears normal for age. Major intracranial vascular flow voids  are preserved. No intracranial mass effect. No ventriculomegaly. No chronic cerebral blood products. Negative pituitary. Negative cervicomedullary junction. Grossly negative visualized cervical spine. The lesion seen in the right cerebellum on CT corresponds to abnormal T2 and FLAIR hyperintensity along the posterior margin of the right cerebellar peduncle. This area appears mostly facilitated on diffusion column a although the margins may be restricted as seen on series 3, image 15. There is no surrounding edema, no mass effect. The remainder of the cerebellum is normal. The brainstem is normal. There has been superimposed bilateral nodular and indistinct periventricular white matter T2 and FLAIR hyperintensity. The left temporal lobe white matter is affected. The corpus callosum is spared. Superimposed more subcortical white matter lesions in both superior frontal gyri, greater on the left (series 6, image 20). There is also involvement at the right superior peri-Rolandic  gyrus white matter and perhaps cortex as seen on series 5, image 23. These lesions also appear facilitated on diffusion, although some of the largest lesions show T2 shine through. There is then a superimposed punctate focus of increased trace diffusion in the posterior right thalamus (series 3, image 26) which is isointense on ADC. There is no associated T2 or FLAIR hyperintensity here. The other deep gray matter nuclei appear normal for age. No other cortical encephalomalacia. Visible internal auditory structures appear normal. Mastoids are clear. Trace paranasal sinus mucosal thickening. Grossly normal orbits soft tissues. Negative scalp soft tissues. Normal bone marrow signal. IMPRESSION: 1. Abnormal signal in the bilateral periventricular and to a lesser extent subcortical hemispheric white matter, as well as in right cerebellar white matter. These findings more resemble a Demyelinating or Inflammatory process rather than ischemia. Infectious etiology such as viral encephalitis should be considered. There is no associated mass effect or hemorrhage. There does appear to be some right superior peri-Rolandic cortical involvement. Post-contrast evaluation may or may not refine the differential diagnosis. Tumor (CNS lymphoma or metastatic disease) was considered but felt unlikely. 2. Superimposed punctate area of questionably restricted diffusion in the posterior right thalamus. It is unclear whether this is related to #1, or a subacute lacunar infarct. Electronically Signed   By: [place] Fleming M.D.   On: 03/27/2016 18:37    ECG & Cardiac Imaging    EKG: SR with new T wave inversion in inferior lateral leads, new finding since previous tracing   Assessment & Plan    Mr. Brafford is a 60 yo with PMH of HTN/HLD and tobacco use. Denies ever having had a cardiac workup in the past, having seen a cardiologist. Reports she is normally very active, and currently works in a physically demanding job. He reports never having  had any dyspnea on exertion or anginal pain with work or during his regular daily activities. He presented to the [facility] ED with reports of 3 days of ongoing headaches. He was also noted to be hypertensive on arrival but reported taking his home medications including his Cozaar as prescribed. While in the ED patient experienced one episode of cramping to his upper arms and right lower quadrant itch prompted the EDP to obtain an EKG which did show T-wave inversion in inferior lateral leads which is a new finding compared to previous tracing in 2016. He denies any chest pain, palpitations, dyspnea, lightheadedness, dizziness or nausea/vomiting.  1. Headaches: Per primary management, no pain this morning  2. Flank pain/T wave inversion: Reports he had one brief episode of RLQ cramping while in radiology in the ED. States that he  has these cramping episodes periodically. As a result an EKG was performed that showed questionable new T wave inversion in the inferior/lateral leads compared to previous tracings in 2016. In looking back over previous EKGs there does appear to be some TWI in the inferior leads. He adamantly denies having any chest pain, DOE, palpitations, lightheadedness or dizziness, prior to or during this admission. Reports he recently stopped smoking, and plans to continue this.  -- Trops cycled and neg x3 -- Given he has been without any cardiac symptoms during this admission, would not pursue inpatient work up. Given he has never had an Echo, could proceed with this in the outpatient setting.   3. HTN: Noted to be hypertensive during this admission, ARB held on admission in the setting of AKI  4. AKI: Cr improved this morning 1.43>>1.04.   5. HLD: Lipid panel shows HDL 37, and LDL 141. Would benefit from statin   Signed, Laverda Page, NP-C Pager [phone] 03/28/2016, 8:53 AM  I have seen and examined the patient along with Laverda Page NP-C.  I have reviewed the chart,  notes and new data.  I agree with NP's note.  Key new complaints: he does not have symptoms of CAD, headache has resolved. No focal neurological complaints, Key examination changes: S4, otherwise normal cardiac exam Key new findings / data: elevated cholesterol (he ran out of the statin prescribed by Milus Height, PA who is his PCP). ECG changes are most consistent with LVH and secondary repol changes. The differences between current ECG and 2016 can be explained by lead placement (looks like V6 was placed too medially on older ECG).  PLAN: Outpatient echo to confirm LVH. Treatment of HTN to goal 130/80. Restart statin. Smoking cessation recommended. No plan for additional inpatient Cardiology workup.  Thurmon Fair, MD, [facility] [phone] 03/28/2016, 11:35 AM

## 2016-03-28 NOTE — Discharge Summary (Signed)
Family Medicine Teaching [facility] Discharge Summary  Patient name: Patrick Gilbert Medical record number: [id number] Date of birth: 06/03/1956 Age: 60 y.o. Gender: male Date of Admission: 03/27/2016  Date of Discharge: 03/28/16 Admitting Physician: Leighton Roach McDiarmid, MD  Primary Care Provider: No PCP Per Patient Consultants: Cardiology, Neurology  Indication for Hospitalization: Headache  Discharge Diagnoses/Problem List:  Abnormal MRI brain findings Headache HTN HLD Rib pain  Disposition: home  Discharge Condition: Stable  Discharge Exam:  General: Sitting up in bed, in NAD HEENT: PERRL, EOMI, [place]/AT Cardiovascular: RRR, no murmurs appreciated Respiratory: CTAB, normal effort Abdomen: soft, nt, nd +bs Extremities: no cyanosis/edema Neuro: A&Ox3, no focal deficits. CN II-XII intact. Strength 5/5 on R UE and b/l LE, 4/5 on L UE. Sensation intact throughout.  Brief Hospital Course:  Patrick Gilbert is a 60 y.o. male with a past medical history significant for HTN, HLD who presented with temporal headaches in the setting of abnormal MRI findings concerning for infectious vs inflammatory vs neoplastic etiology.  Headache with abnormal MRI brain findings: Patient presented with new onset headaches 3 days prior that started around his L eye, sharp and unrelenting. CT head showed areas of hypoattenuation that could be consistent with ischemia so MRI brain was ordered. MRI brain showed abnormal signal in the bilateral periventricular and to a lesser extent subcortical hemispheric white matter, as well as in right cerebellar white matter.  Patient's headache resolved overnight and he was very anxious to go home. Neurology saw patient and counseled him on the optimal medical management which involved staying inpatient for LP and additional workup. Patient voiced good understanding and declined, he opted for discharge with outpatient follow up with neurology in next 6 to 8 weeks. He was  counseled to immediately return to hospital if any new or progression of symptoms and the importance of neurology follow-up was stressed to the patient on discharge.  AKI: On admission he had an elevated creatinine 1.43 with baseline around 1, likely secondary to dehydration. He received IV hydration with resolution of creatinine to 1.04 overnight.  HTN: Patient's home losartan was held since he had an elevated creatinine. His home medication was restarted after resolution of his acute kidney injury.  HLD: Patient was not on a statin previously was started on atorvastatin 40mg  on day of discharge  Issues for Follow Up:  1. Neurology follow up appointment for abnormal brain MRI findings of abnormal signal in the bilateral periventricular and to a lesser extent subcortical hemispheric white matter, as well as in right cerebellar white matter. 2. Patient was started on atorvastatin on day of discharge.  Significant Procedures: none  Significant Labs and Imaging:   Recent Labs Lab 03/27/16 1038  WBC 5.7  HGB 16.5  HCT 49.0  PLT 215    Recent Labs Lab 03/27/16 1038 03/28/16 0312  NA 137 135  K 3.7 4.3  CL 104 104  CO2 21* 25  GLUCOSE 119* 96  BUN 16 11  CREATININE 1.43* 1.04  CALCIUM 9.9 8.9  ALKPHOS 52  --   AST 25  --   ALT 30  --   ALBUMIN 4.6  --     Dg Chest 2 View  03/27/2016  CLINICAL DATA:  Left chest pain. EXAM: CHEST  2 VIEW COMPARISON:  08/13/2015 FINDINGS: The lungs appear clear. Cardiac and mediastinal contours normal. No pleural effusion identified. Thoracic spondylosis noted. IMPRESSION: 1.  No active cardiopulmonary disease is radiographically apparent. Electronically Signed   By: Zollie Beckers  Ova Freshwater M.D.   On: 03/27/2016 11:06   Ct Head Wo Contrast  03/27/2016  CLINICAL DATA:  Hypertension and headache for 2 days. EXAM: CT HEAD WITHOUT CONTRAST TECHNIQUE: Contiguous axial images were obtained from the base of the skull through the vertex without  intravenous contrast. COMPARISON:  None. FINDINGS: No evidence for acute hemorrhage, hydrocephalus, or abnormal extra-axial fluid collection. No CT evidence for mass-effect. Focal area of hypoattenuation is identified in the right occipital periventricular white matter, compatible with age-indeterminate ischemia. Similar age-indeterminate lacunar infarct is seen in the right basal ganglia. The visualized paranasal sinuses and mastoid air cells are clear. IMPRESSION: Small focal areas of hypoattenuation in the right occipital periventricular white matter and right cerebellum are compatible with the age indeterminate ischemia. MRI could be used to determine whether these findings are acute to subacute, as clinically warranted. Electronically Signed   By: Kennith Center M.D.   On: 03/27/2016 11:51   Mr Brain Wo Contrast  03/27/2016  CLINICAL DATA:  60 year old male with left side headache with nausea. Abnormal appearance of the right cerebellum on head CT today. Initial encounter. EXAM: MRI HEAD WITHOUT CONTRAST TECHNIQUE: Multiplanar, multiecho pulse sequences of the brain and surrounding structures were obtained without intravenous contrast. COMPARISON:  Head CT without contrast 1135 hours today. FINDINGS: Cerebral volume appears normal for age. Major intracranial vascular flow voids are preserved. No intracranial mass effect. No ventriculomegaly. No chronic cerebral blood products. Negative pituitary. Negative cervicomedullary junction. Grossly negative visualized cervical spine. The lesion seen in the right cerebellum on CT corresponds to abnormal T2 and FLAIR hyperintensity along the posterior margin of the right cerebellar peduncle. This area appears mostly facilitated on diffusion column a although the margins may be restricted as seen on series 3, image 15. There is no surrounding edema, no mass effect. The remainder of the cerebellum is normal. The brainstem is normal. There has been superimposed bilateral  nodular and indistinct periventricular white matter T2 and FLAIR hyperintensity. The left temporal lobe white matter is affected. The corpus callosum is spared. Superimposed more subcortical white matter lesions in both superior frontal gyri, greater on the left (series 6, image 20). There is also involvement at the right superior peri-Rolandic gyrus white matter and perhaps cortex as seen on series 5, image 23. These lesions also appear facilitated on diffusion, although some of the largest lesions show T2 shine through. There is then a superimposed punctate focus of increased trace diffusion in the posterior right thalamus (series 3, image 26) which is isointense on ADC. There is no associated T2 or FLAIR hyperintensity here. The other deep gray matter nuclei appear normal for age. No other cortical encephalomalacia. Visible internal auditory structures appear normal. Mastoids are clear. Trace paranasal sinus mucosal thickening. Grossly normal orbits soft tissues. Negative scalp soft tissues. Normal bone marrow signal. IMPRESSION: 1. Abnormal signal in the bilateral periventricular and to a lesser extent subcortical hemispheric white matter, as well as in right cerebellar white matter. These findings more resemble a Demyelinating or Inflammatory process rather than ischemia. Infectious etiology such as viral encephalitis should be considered. There is no associated mass effect or hemorrhage. There does appear to be some right superior peri-Rolandic cortical involvement. Post-contrast evaluation may or may not refine the differential diagnosis. Tumor (CNS lymphoma or metastatic disease) was considered but felt unlikely. 2. Superimposed punctate area of questionably restricted diffusion in the posterior right thalamus. It is unclear whether this is related to #1, or a subacute lacunar infarct. Electronically Signed  By: [place] Fleming M.D.   On: 03/27/2016 18:37    Results/Tests Pending at Time of Discharge:  none  Discharge Medications:    Medication List    TAKE these medications        atorvastatin 40 MG tablet  Commonly known as:  LIPITOR  Take 1 tablet (40 mg total) by mouth daily.     ibuprofen 200 MG tablet  Commonly known as:  ADVIL,MOTRIN  Take 400 mg by mouth daily as needed (pain).     losartan 25 MG tablet  Commonly known as:  COZAAR  Take 25 mg by mouth daily.        Discharge Instructions: Please refer to Patient Instructions section of EMR for full details.  Patient was counseled important signs and symptoms that should prompt return to medical care, changes in medications, dietary instructions, activity restrictions, and follow up appointments.   Follow-Up Appointments: Follow-up Information    Follow up with Van Clines, MD On 05/15/2016.   Specialty:  Neurology   Why:  For hospital follow-up at 10:30AM   Contact information:   [address] [place] [address] [phone]       Leland Her, DO 03/28/2016, 5:56 PM PGY-1, [facility]

## 2016-03-28 NOTE — Consult Note (Signed)
NEURO HOSPITALIST CONSULT NOTE      Reason for Consult: HA with abnormal imaging   History obtained from:  Patient and Chart    Patrick Gilbert is a 60 year-old patient with a history of headache that began on Monday.  He reports it was mild to moderate, but resolved spontaneously yesterday.  He notes no cranial nerve symptoms and no focal neurological complaints.  There is no nuchal rigidity.  He notes no prior history of migraine.  There has been no seizure activity or compromise of cognitive function.  He does not appear to be acutely ill.  The imaging revealed abnormalities on MRI that are non-specific in nature, but seem suggestive of an acute infectious / inflammatory / or possibly paraneoplastic process among others.  Neurology has been consulted to provide further input.    HPI:                                                                                                                                          Patrick Gilbert is an 60 y.o. male   Past Medical History  Diagnosis Date  . High blood pressure   . High cholesterol   . Headache 03/2016    Past Surgical History  Procedure Laterality Date  . No past surgeries      Family History  Problem Relation Age of Onset  . Hypertension Mother   . Hypertension Father     Family History:   Social History:  reports that he has been smoking Cigarettes.  He has been smoking about 1.00 pack per day. He has quit using smokeless tobacco. He reports that he does not drink alcohol or use illicit drugs.  No Known Allergies  MEDICATIONS:                                                                                                                     reviewed   ROS:  Reviewed  General ROS: negative for - chills, fatigue, fever, night sweats, weight gain or weight  loss Psychological ROS: negative for - behavioral disorder, hallucinations, memory difficulties, mood swings or suicidal ideation Ophthalmic ROS: negative for - blurry vision, double vision, eye pain or loss of vision ENT ROS: negative for - epistaxis, nasal discharge, oral lesions, sore throat, tinnitus or vertigo Allergy and Immunology ROS: negative for - hives or itchy/watery eyes Hematological and Lymphatic ROS: negative for - bleeding problems, bruising or swollen lymph nodes Endocrine ROS: negative for - galactorrhea, hair pattern changes, polydipsia/polyuria or temperature intolerance Respiratory ROS: negative for - cough, hemoptysis, shortness of breath or wheezing Cardiovascular ROS: negative for - chest pain, dyspnea on exertion, edema or irregular heartbeat Gastrointestinal ROS: negative for - abdominal pain, diarrhea, hematemesis, nausea/vomiting or stool incontinence Genito-Urinary ROS: negative for - dysuria, hematuria, incontinence or urinary frequency/urgency Musculoskeletal ROS: negative for - joint swelling or muscular weakness Neurological ROS: as noted in HPI Dermatological ROS: negative for rash and skin lesion changes   Blood pressure 150/96, pulse 72, temperature 98.4 F (36.9 C), temperature source Oral, resp. rate 18, height  (1.778 m), weight 88.089 kg (194 lb 3.2 oz), SpO2 100 %.   Neurologic Examination:                                                                                                      HEENT-  Normocephalic, no lesions, without obvious abnormality.  Normal external eye and conjunctiva.  Normal TM's bilaterally.  Normal auditory canals and external ears. Normal external nose, mucus membranes and septum.  Normal pharynx. Cardiovascular- pulses palpable throughout   Lungs- Heart exam - S1, S2 normal, no murmur, no gallop, rate regular Abdomen- NT/ND Extremities- -CCE Lymph- no adenopathy palpable Musculoskeletal- no joint tenderness,  deformity or swelling Skin- normal  Neurological Examination Mental Status: Alert, oriented, thought content appropriate.  Speech fluent without evidence of aphasia.  Able to follow 3 step commands without difficulty. Cranial Nerves: II: Discs flat bilaterally; Visual fields grossly normal, pupils equal, round, reactive to light and accommodation III,IV, VI: ptosis not present, extra-ocular motions intact bilaterally V,VII: smile symmetric, facial light touch sensation normal bilaterally VIII: hearing normal bilaterally IX,X: uvula rises symmetrically XI: bilateral shoulder shrug XII: midline tongue extension Motor: Right : Upper extremity   5/5    Left:     Upper extremity   5/5  Lower extremity   5/5     Lower extremity   5/5 Tone and bulk:normal tone throughout; no atrophy noted Sensory: Pinprick and light touch intact throughout, bilaterally Deep Tendon Reflexes: 2+ and symmetric throughout Plantars: Right: downgoing   Left: downgoing Cerebellar: normal finger-to-nose, normal rapid alternating movements and normal heel-to-shin test Gait: normal gait and station      Lab Results: Basic Metabolic Panel:  Recent Labs Lab 03/27/16 1038 03/28/16 0312  NA 137 135  K 3.7 4.3  CL 104 104  CO2 21* 25  GLUCOSE 119* 96  BUN 16 11  CREATININE 1.43* 1.04  CALCIUM 9.9 8.9    Liver  Function Tests:  Recent Labs Lab 03/27/16 1038  AST 25  ALT 30  ALKPHOS 52  BILITOT 1.0  PROT 7.7  ALBUMIN 4.6    Recent Labs Lab 03/27/16 1038  LIPASE 47   No results for input(s): AMMONIA in the last 168 hours.  CBC:  Recent Labs Lab 03/27/16 1038  WBC 5.7  HGB 16.5  HCT 49.0  MCV 86.0  PLT 215    Cardiac Enzymes:  Recent Labs Lab 03/27/16 1513 03/27/16 2113 03/28/16 0312  CKTOTAL 317  --   --   TROPONINI <0.03 <0.03 <0.03    Lipid Panel:  Recent Labs Lab 03/28/16 0312  CHOL 197  TRIG 96  HDL 37*  CHOLHDL 5.3  VLDL 19  LDLCALC 161*     CBG:  Recent Labs Lab 03/27/16 0945  GLUCAP 134*    Microbiology: Results for orders placed or performed during the hospital encounter of 09/08/12  Rapid strep screen     Status: None   Collection Time: 09/09/12  1:08 AM  Result Value Ref Range Status   Streptococcus, Group A Screen (Direct) NEGATIVE NEGATIVE Final    Comment:        DUE TO INADEQUATE SENSITIVITY OF EIA RAPID TESTS FOR GROUP A STREP (GAS) IT IS RECOMMENDED THAT ALL NEGATIVE RESULTS BE FOLLOWED BY A GROUP A STREP PROBE.     Imaging: Dg Chest 2 View  03/27/2016  CLINICAL DATA:  Left chest pain. EXAM: CHEST  2 VIEW COMPARISON:  08/13/2015 FINDINGS: The lungs appear clear. Cardiac and mediastinal contours normal. No pleural effusion identified. Thoracic spondylosis noted. IMPRESSION: 1.  No active cardiopulmonary disease is radiographically apparent. Electronically Signed   By: Gaylyn Rong M.D.   On: 03/27/2016 11:06   Ct Head Wo Contrast  03/27/2016  CLINICAL DATA:  Hypertension and headache for 2 days. EXAM: CT HEAD WITHOUT CONTRAST TECHNIQUE: Contiguous axial images were obtained from the base of the skull through the vertex without intravenous contrast. COMPARISON:  None. FINDINGS: No evidence for acute hemorrhage, hydrocephalus, or abnormal extra-axial fluid collection. No CT evidence for mass-effect. Focal area of hypoattenuation is identified in the right occipital periventricular white matter, compatible with age-indeterminate ischemia. Similar age-indeterminate lacunar infarct is seen in the right basal ganglia. The visualized paranasal sinuses and mastoid air cells are clear. IMPRESSION: Small focal areas of hypoattenuation in the right occipital periventricular white matter and right cerebellum are compatible with the age indeterminate ischemia. MRI could be used to determine whether these findings are acute to subacute, as clinically warranted. Electronically Signed   By: Kennith Center M.D.   On:  03/27/2016 11:51   Mr Brain Wo Contrast  03/27/2016  CLINICAL DATA:  60 year old male with left side headache with nausea. Abnormal appearance of the right cerebellum on head CT today. Initial encounter. EXAM: MRI HEAD WITHOUT CONTRAST TECHNIQUE: Multiplanar, multiecho pulse sequences of the brain and surrounding structures were obtained without intravenous contrast. COMPARISON:  Head CT without contrast 1135 hours today. FINDINGS: Cerebral volume appears normal for age. Major intracranial vascular flow voids are preserved. No intracranial mass effect. No ventriculomegaly. No chronic cerebral blood products. Negative pituitary. Negative cervicomedullary junction. Grossly negative visualized cervical spine. The lesion seen in the right cerebellum on CT corresponds to abnormal T2 and FLAIR hyperintensity along the posterior margin of the right cerebellar peduncle. This area appears mostly facilitated on diffusion column a although the margins may be restricted as seen on series 3, image 15. There is no  surrounding edema, no mass effect. The remainder of the cerebellum is normal. The brainstem is normal. There has been superimposed bilateral nodular and indistinct periventricular white matter T2 and FLAIR hyperintensity. The left temporal lobe white matter is affected. The corpus callosum is spared. Superimposed more subcortical white matter lesions in both superior frontal gyri, greater on the left (series 6, image 20). There is also involvement at the right superior peri-Rolandic gyrus white matter and perhaps cortex as seen on series 5, image 23. These lesions also appear facilitated on diffusion, although some of the largest lesions show T2 shine through. There is then a superimposed punctate focus of increased trace diffusion in the posterior right thalamus (series 3, image 26) which is isointense on ADC. There is no associated T2 or FLAIR hyperintensity here. The other deep gray matter nuclei appear normal for  age. No other cortical encephalomalacia. Visible internal auditory structures appear normal. Mastoids are clear. Trace paranasal sinus mucosal thickening. Grossly normal orbits soft tissues. Negative scalp soft tissues. Normal bone marrow signal. IMPRESSION: 1. Abnormal signal in the bilateral periventricular and to a lesser extent subcortical hemispheric white matter, as well as in right cerebellar white matter. These findings more resemble a Demyelinating or Inflammatory process rather than ischemia. Infectious etiology such as viral encephalitis should be considered. There is no associated mass effect or hemorrhage. There does appear to be some right superior peri-Rolandic cortical involvement. Post-contrast evaluation may or may not refine the differential diagnosis. Tumor (CNS lymphoma or metastatic disease) was considered but felt unlikely. 2. Superimposed punctate area of questionably restricted diffusion in the posterior right thalamus. It is unclear whether this is related to #1, or a subacute lacunar infarct. Electronically Signed   By: Odessa Fleming M.D.   On: 03/27/2016 18:37    Assessment/Plan:  Aric reports his headache began on Monday.  However, he notes it has been mild and resolved yesterday.  He went to the ER as a quick check.  His neurological exam is normal.  He reports no prior symptoms suggestive of a history of demyelinating disease.  We discussed his imaging findings.  It was explained there is a suspicion of what is likely an acute process.  Unfortunately, the imaging is non-specific in nature.  However, this could represent an infection / inflammatory / or possibly paraneoplastic process among others.  It was explained that the most prudent means of proceeding would be to perform an LP with routine studies to help evaluate for these other considerations.    Javel was stated he did not want further evaluations, or LP at this time.  He states his headache has resolved and he wants to go  home.  He understands that we might be missing a potentially serious underlying condition.  While not as optimal, given that [facility] does not want further evaluations performed at this time, it would be reasonable to consider a follow up MRI in the next six to eight weeks with follow up with a local neurologist.  Any new symptoms, or progression of symptoms would warrant an immediate return to the hospital.  Thank you for consulting neurology to assist with your patient.   Daleon A. Hilda Blades, M.D. Neurohospitalist Phone: [phone]  03/28/2016, 2:43 PM

## 2016-03-28 NOTE — Progress Notes (Signed)
Family Medicine Teaching Service Daily Progress Note Intern Pager: [phone]  Patient name: Patrick Gilbert Medical record number: [id number] Date of birth: Mar 01, 1956 Age: 60 y.o. Gender: male  Primary Care Provider: No PCP Per Patient Consultants: None Code Status: Full  Pt Overview and Major Events to Date:  7/13 admitted for HA suspicious for CVA  Assessment and Plan: Patrick Gilbert is a 60 y.o. male with a past medical history significant for HTN, HLD who presented with temporal headaches in the setting of abnormal CT findings suspicious for stroke.   Headaches, Acute onset with abnormal MRI brain findings possibly demyelinating condition vs infectious vs migraines/tension HA/cluster HA vs unlikely ischemic neurologic event given new MRI brain findings. Temporal arteritis could be a possibility but patient is a male and relatively young. There were no trauma. Patient with history of hypertension and smoking, CT shows area of hypoattenuation that could be consistent with ischemia which could be related to symptoms. Vascular insult in the setting of uncontrolled HTN could causes headaches. Patient does work outside in the summer and dehydration would be high on our differential as a possible cause for headaches, though patient endorses good hydration. MRI brain showed abnormal signal in the bilateral periventricular, subcortical hemispheric white matter, and right cerebellar white matter c/w demyelinating or inflammatory process, also with superimposed punctate area of questionably restricted diffusion in the posterior right thalamus unclear if related to white matter findings, or a subacute lacunar infarct.  -DC NS 125 cc/hr since patient is drinking po well -Tylenol as needed for pain relief. -Consider ESR to rule out temporal arteritis if above negative and symptoms not improving - HIV pending considering overdue for screening and WBC 5.7 on admit - consider LP given MRI findings as above -  Neuro consult, appreciate reccomendations  Rib Pain. Likely musculoskeletal in etiology, though does have new T waves inversions in limbs and lateral leads compare to previous EKG in November. Patient denies chest pain, tachycardia, numbness, tingling diaphoresis. Lipid panel showing LDL 141. TSH 0.996 A1C 5.9 Troponins neg x3. Repeat EKG this am showed similar T wave changes. Per cardiology, can follow up as outpatient. -Continue ASA 13m daily -Telemetry -possible follow up with cardiology in the outpatient setting  Hypertension now BP 171/92 Patient with history of hypertension, patient taking home medications as prescribed.  -Will monitor BP and give hydralazine IV 544mPRN -restart losartan 2536moday since creatinine now at baseline  Hyperlipidemia Does not appear to be on statin. ASCVD risk score 32.4, moderate to high high intensity statin needed. LFTs WNL.  -Start atorvastatin 74m53mday  AKI, resolved Elevated creatinine 1.43 on admission, baseline around 1.0 most likely secondary to dehydration. Patient work outside in the summer for extended period of time. CK normal. Cr 1.04 - DC NS - monitor  FEN/GI: Heart healthy diet Prophylaxis: lovenox  Disposition: pending medical management  Subjective:  Patient states he feels well this morning. No longer has HA and states wants to leave today. He states he does not believe that his MRI findings are the cause of his HA, thinks is due to his teeth. Denies HA, vision changes but admits to chronic blurry vision, thinks he needs glasses. Denies dizziness, CP, SOB. Eating well. States is unwilling to stay in the hospital and is going to home later today regardless of primary medical team's recommendations.  Objective: Temp:  [98.1 F (36.7 C)-98.7 F (37.1 C)] 98.1 F (36.7 C) (07/14 0629) Pulse Rate:  [70-93] 74 (07/14 0629)  Resp:  [15-20] 18 (07/14 0629) BP: (130-158)/(58-112) 158/106 mmHg (07/14 0629) SpO2:  [96 %-100 %] 96 %  (07/14 0629) Weight:  [191 lb (86.637 kg)-194 lb 3.2 oz (88.089 kg)] 194 lb 3.2 oz (88.089 kg) (07/14 1062)   Physical Exam: General: Sitting up in bed, in NAD HEENT: PERRL, EOMI, [place]/AT Cardiovascular: RRR, no murmurs appreciated Respiratory: CTAB, normal effort Abdomen: soft, nt, nd +bs Extremities: no cyanosis/edema Neuro: A&Ox3, no focal deficits. CN II-XII intact. Strength 5/5 on R UE and b/l LE, 4/5 on L UE. Sensation intact throughout. No DTR at L biceps.  Laboratory:  Recent Labs Lab 03/27/16 1038  WBC 5.7  HGB 16.5  HCT 49.0  PLT 215    Recent Labs Lab 03/27/16 1038 03/28/16 0312  NA 137 135  K 3.7 4.3  CL 104 104  CO2 21* 25  BUN 16 11  CREATININE 1.43* 1.04  CALCIUM 9.9 8.9  PROT 7.7  --   BILITOT 1.0  --   ALKPHOS 52  --   ALT 30  --   AST 25  --   GLUCOSE 119* 96   Lipid Panel     Component Value Date/Time   CHOL 197 03/28/2016 0312   TRIG 96 03/28/2016 0312   HDL 37* 03/28/2016 0312   CHOLHDL 5.3 03/28/2016 0312   VLDL 19 03/28/2016 0312   LDLCALC 141* 03/28/2016 0312   TSH 0.996  Troponin <0.03 x3  CK 317  A1C 5.9  Imaging/Diagnostic Tests: Dg Chest 2 View  03/27/2016  CLINICAL DATA:  Left chest pain. EXAM: CHEST  2 VIEW COMPARISON:  08/13/2015 FINDINGS: The lungs appear clear. Cardiac and mediastinal contours normal. No pleural effusion identified. Thoracic spondylosis noted. IMPRESSION: 1.  No active cardiopulmonary disease is radiographically apparent. Electronically Signed   By: Van Clines M.D.   On: 03/27/2016 11:06   Ct Head Wo Contrast  03/27/2016  CLINICAL DATA:  Hypertension and headache for 2 days. EXAM: CT HEAD WITHOUT CONTRAST TECHNIQUE: Contiguous axial images were obtained from the base of the skull through the vertex without intravenous contrast. COMPARISON:  None. FINDINGS: No evidence for acute hemorrhage, hydrocephalus, or abnormal extra-axial fluid collection. No CT evidence for mass-effect. Focal area of  hypoattenuation is identified in the right occipital periventricular white matter, compatible with age-indeterminate ischemia. Similar age-indeterminate lacunar infarct is seen in the right basal ganglia. The visualized paranasal sinuses and mastoid air cells are clear. IMPRESSION: Small focal areas of hypoattenuation in the right occipital periventricular white matter and right cerebellum are compatible with the age indeterminate ischemia. MRI could be used to determine whether these findings are acute to subacute, as clinically warranted. Electronically Signed   By: Misty Stanley M.D.   On: 03/27/2016 11:51   Mr Brain Wo Contrast  03/27/2016  CLINICAL DATA:  60 year old male with left side headache with nausea. Abnormal appearance of the right cerebellum on head CT today. Initial encounter. EXAM: MRI HEAD WITHOUT CONTRAST TECHNIQUE: Multiplanar, multiecho pulse sequences of the brain and surrounding structures were obtained without intravenous contrast. COMPARISON:  Head CT without contrast 1135 hours today. FINDINGS: Cerebral volume appears normal for age. Major intracranial vascular flow voids are preserved. No intracranial mass effect. No ventriculomegaly. No chronic cerebral blood products. Negative pituitary. Negative cervicomedullary junction. Grossly negative visualized cervical spine. The lesion seen in the right cerebellum on CT corresponds to abnormal T2 and FLAIR hyperintensity along the posterior margin of the right cerebellar peduncle. This area appears mostly facilitated on  diffusion column a although the margins may be restricted as seen on series 3, image 15. There is no surrounding edema, no mass effect. The remainder of the cerebellum is normal. The brainstem is normal. There has been superimposed bilateral nodular and indistinct periventricular white matter T2 and FLAIR hyperintensity. The left temporal lobe white matter is affected. The corpus callosum is spared. Superimposed more  subcortical white matter lesions in both superior frontal gyri, greater on the left (series 6, image 20). There is also involvement at the right superior peri-Rolandic gyrus white matter and perhaps cortex as seen on series 5, image 23. These lesions also appear facilitated on diffusion, although some of the largest lesions show T2 shine through. There is then a superimposed punctate focus of increased trace diffusion in the posterior right thalamus (series 3, image 26) which is isointense on ADC. There is no associated T2 or FLAIR hyperintensity here. The other deep gray matter nuclei appear normal for age. No other cortical encephalomalacia. Visible internal auditory structures appear normal. Mastoids are clear. Trace paranasal sinus mucosal thickening. Grossly normal orbits soft tissues. Negative scalp soft tissues. Normal bone marrow signal. IMPRESSION: 1. Abnormal signal in the bilateral periventricular and to a lesser extent subcortical hemispheric white matter, as well as in right cerebellar white matter. These findings more resemble a Demyelinating or Inflammatory process rather than ischemia. Infectious etiology such as viral encephalitis should be considered. There is no associated mass effect or hemorrhage. There does appear to be some right superior peri-Rolandic cortical involvement. Post-contrast evaluation may or may not refine the differential diagnosis. Tumor (CNS lymphoma or metastatic disease) was considered but felt unlikely. 2. Superimposed punctate area of questionably restricted diffusion in the posterior right thalamus. It is unclear whether this is related to #1, or a subacute lacunar infarct. Electronically Signed   By: Genevie Ann M.D.   On: 03/27/2016 18:37    Bufford Lope, DO 03/28/2016, 8:13 AM PGY-1, [place] Intern pager: [phone], text pages welcome

## 2016-03-28 NOTE — Discharge Instructions (Signed)
You were admitted for a headache. MRI of your brain (a scan of your brain) showed abnormalities. You were evaluated by a neurologist, who feels these changes may be due to an infection, inflammation, or possibly other causes that may lead to serious long-term medical problems. The neurologist and the family medicine team recommended that you remain inpatient to receive further work-up to determine the cause of your brain abnormalities, however you are choosing to pursue outpatient work-up. You have an appointment with Dr. Karel Jarvis at [facility] in [place] on May 15, 2016 at 10:30AM. It is very important to go to this appointment.   We have started you on a new medication. Please begin taking atorvastatin (Lipitor) 40mg  once a day. Please continue to take your other medications as you were before admission.

## 2016-03-28 NOTE — Progress Notes (Signed)
This morning pt was very adamant about leaving AMA. RN consulted with pt and he talked to his mother on the phone who advised him to stay. DR made aware. Pt states if they do not pursue any testing prior to 5 Oclock he will leave

## 2016-03-28 NOTE — Progress Notes (Signed)
Pt discharged home. Discharge instructions have been gone over with the patient. IV's removed. Pt given unit number and told to call if they have any concerns regarding their discharge instructions. Pt states he will go to follow up appointments and take medications as prescribed. Cecille Rubin, RN

## 2016-04-16 ENCOUNTER — Other Ambulatory Visit (HOSPITAL_COMMUNITY): Payer: PRIVATE HEALTH INSURANCE

## 2016-04-16 DIAGNOSIS — R0989 Other specified symptoms and signs involving the circulatory and respiratory systems: Secondary | ICD-10-CM

## 2016-04-21 ENCOUNTER — Ambulatory Visit (HOSPITAL_COMMUNITY)
Admission: EM | Admit: 2016-04-21 | Discharge: 2016-04-21 | Disposition: A | Discharge status: Home / Self Care | Payer: PRIVATE HEALTH INSURANCE | Attending: Family Medicine | Admitting: Family Medicine

## 2016-04-21 ENCOUNTER — Encounter (HOSPITAL_COMMUNITY): Payer: Self-pay | Admitting: Emergency Medicine

## 2016-04-21 DIAGNOSIS — T1592XA Foreign body on external eye, part unspecified, left eye, initial encounter: Secondary | ICD-10-CM

## 2016-04-21 NOTE — ED Provider Notes (Signed)
[facility]    CSN: [id number] Arrival date & time: 04/21/16  1313  First Provider Contact:  First MD Initiated Contact with Patient 04/21/16 1422     History   Chief Complaint Chief Complaint  Patient presents with  . Eye Problem   HPI Patrick Gilbert is a 60 y.o. male presenting with bilateral eye irritation. He reports irritation, gritty sensation in both eyes that started 2 days ago while mowing the lawn. He reports feeling like something got into both eyes which improved somewhat with eye drops, but still hasn't resolved. He denies pain, changes in vision, but has had some redness in the left > right eye. He was not wearing eye protection as he usually does.   Past Medical History:  Diagnosis Date  . Headache 03/2016  . High blood pressure   . High cholesterol     Patient Active Problem List   Diagnosis Date Noted  . Abnormal MRI of head   . Chest pain 03/27/2016  . AKI (acute kidney injury) (HCC) 03/27/2016  . Headache 03/27/2016  . Tobacco abuse 03/27/2016  . Abnormal EKG   . Cephalalgia   . Pain in the chest     Past Surgical History:  Procedure Laterality Date  . NO PAST SURGERIES     Home Medications    Prior to Admission medications   Medication Sig Start Date End Date Taking? Authorizing Provider  atorvastatin (LIPITOR) 40 MG tablet Take 1 tablet (40 mg total) by mouth daily. 03/28/16   Marquette Saa, MD  ibuprofen (ADVIL,MOTRIN) 200 MG tablet Take 400 mg by mouth daily as needed (pain).    Historical Provider, MD  losartan (COZAAR) 25 MG tablet Take 25 mg by mouth daily. 03/10/16   Historical Provider, MD   Family History Family History  Problem Relation Age of Onset  . Hypertension Mother   . Hypertension Father     Social History Social History  Substance Use Topics  . Smoking status: Current Every Day Smoker    Packs/day: 1.00    Types: Cigarettes  . Smokeless tobacco: Former Neurosurgeon  . Alcohol use No     Comment: stopped  drinking last week     Allergies   Review of patient's allergies indicates no known allergies.   Review of Systems Review of Systems As above  Physical Exam Triage Vital Signs ED Triage Vitals  Enc Vitals Group     BP      Pulse      Resp      Temp      Temp src      SpO2      Weight      Height      Head Circumference      Peak Flow      Pain Score      Pain Loc      Pain Edu?      Excl. in GC?    No data found.   Updated Vital Signs BP 141/91 (BP Location: Left Arm)   Pulse 98   Temp 98.1 F (36.7 C) (Oral)   Resp 16   SpO2 98%   Visual Acuity Right Eye Distance: 20/200 Left Eye Distance: 20/40 Bilateral Distance: 20/40  Right Eye Near:   Left Eye Near:    Bilateral Near:     Physical Exam  Constitutional: He appears well-developed and well-nourished. No distress.  HENT:  Head: Normocephalic and atraumatic.  Right Ear: External ear  normal.  Left Ear: External ear normal.  Nose: Nose normal.  Mouth/Throat: Oropharynx is clear and moist.  Eyes:  muddy sclerae with faint conjunctival erythema bilaterally not extending to cornea. No FB noted on cornea or conjunctivae with eyelid eversion. Cornea without defect on fluorescein examination. PERRL. EOMI. No photophobia.   Vitals reviewed.  UC Treatments / Results  Labs (all labs ordered are listed, but only abnormal results are displayed) Labs Reviewed - No data to display  EKG  EKG Interpretation None      Radiology No results found.  Procedures Procedures (including critical care time)  Medications Ordered in UC Medications - No data to display   Initial Impression / Assessment and Plan / UC Course  I have reviewed the triage vital signs and the nursing notes.  Pertinent labs & imaging results that were available during my care of the patient were reviewed by me and considered in my medical decision making (see chart for details).  Final Clinical Impressions(s) / UC Diagnoses    Final diagnoses:  Eye foreign body, left, initial encounter   60 y.o. male with presumed conjunctival foreign body without corneal abrasion. Symptoms improved with washout. Encouraged appropriate eye protection and return precautions reviewed.  - Visual acuity preserved in affected eye, and abnormal but stable per pt in right eye. Recommend evaluation by optometrist.   New Prescriptions Discharge Medication List as of 04/21/2016  3:18 PM       Tyrone Nine, MD 04/21/16 [phone]

## 2016-04-21 NOTE — ED Triage Notes (Signed)
Bilateral eyes irritated.  Reports mowing on Saturday 8/5.  Another person mowing, turned mower so clippings flew in patient's direction.  Patient reports vision slightly blurry, this morning eyelids were stuck together.

## 2016-05-09 ENCOUNTER — Ambulatory Visit (HOSPITAL_COMMUNITY): Payer: PRIVATE HEALTH INSURANCE

## 2016-05-15 ENCOUNTER — Encounter: Payer: Self-pay | Admitting: Neurology

## 2016-05-15 ENCOUNTER — Ambulatory Visit (INDEPENDENT_AMBULATORY_CARE_PROVIDER_SITE_OTHER): Payer: PRIVATE HEALTH INSURANCE | Admitting: Neurology

## 2016-05-15 VITALS — BP 140/88 | HR 88 | Resp 16 | Ht 68.5 in

## 2016-05-15 DIAGNOSIS — R930 Abnormal findings on diagnostic imaging of skull and head, not elsewhere classified: Secondary | ICD-10-CM

## 2016-05-15 DIAGNOSIS — R9089 Other abnormal findings on diagnostic imaging of central nervous system: Secondary | ICD-10-CM

## 2016-05-15 NOTE — Patient Instructions (Addendum)
1. Schedule MRI brain with and without contrast. We have sent a referral to [facility] for your MRI and they will call you directly to schedule your appt. They are located at [address] [facility]. If you need to contact them directly please call [phone].  2. Follow-up in 2 months, call for any changes

## 2016-05-15 NOTE — Progress Notes (Signed)
NEUROLOGY CONSULTATION NOTE  Patrick Gilbert MRN: [id number] DOB: 1955-10-03  Referring provider: Dr. Rose FillersJames Armstrong Primary care provider: none  Reason for consult:  Abnormal MRI brain  Dear Dr Hilda BladesArmstrong:  Thank you for your kind referral of Patrick Gilbert for consultation of the above symptoms. Although his history is well known to you, please allow me to reiterate it for the purpose of our medical record. Records and images were personally reviewed where available.  HISTORY OF PRESENT ILLNESS: This is a pleasant 60 year old right-handed man with a history of hypertension, hyperlipidemia, tobacco use, presenting after a hospital visit for headache, found to have abnormal MRI brain. He states today that he felt "hot" that day and denies having a headache. Records from his ER visit were reviewed, he presented with a 4-day history of headaches, he had profuse diaphoresis then had a headache unrelieved by Ibuprofen. He reported a dull left-sided headache with some nausea. He was also reporting abdominal cramping. As part of his workup, he had a head CT which showed an area of hypoattenuation in the right occipital and right cerebellar regions. He underwent an MRI brain without contrast to further evaluate these lesions, I personally reviewed images, which showed right cerebellar peduncle lesions with possible restricted diffusion around the margins, bilateral nodular and indistinct periventricular FLAIR hyperintensities, more in the left temporal lobe. Corpus callosum spared. There are more subcortical white matter lesions in both superior frontal gyri, greater on the left, also involvement at the right superior peri-Rolandic gyrus white matter and perhaps cortex. There is a superimposed punctate focus of increased trace diffusion in the right posterior thalamus. Findings resemble more a demyelinating or inflammatory process rather than ischemia. Infectious etiology such as viral encephalitis should  be considered. CNS lymphoma or metastatic disease considered but felt unlikely. He refused to have a lumbar puncture and was discharged home with outpatient neurology follow-up.  He presents today and reports that he has been doing well with no further recurrence of symptoms that brought him to the ER. His main concern today is bilateral hip/groin pain, he feels like there is a catch in his joints at the hip and knees. He has occasional tingling in both feet. He has occasional left arm pain. Ibuprofen helps briefly for the joint pains. He was given gabapentin by his PCP last week and he asks if dose can be increased. He denies any headaches, dizziness, diplopia, dysarthria/dysphagia, neck/back pain, bowel/bladder dysfunction. He denies any prior episodes of focal symptoms or vision loss. He denies any recent travels, immunizations, febrile illness, sick contacts. He lives alone with a Gaynelle Cageparakeet he has owned for 10 years. He denies any falls. He denies any episodes of staring/unresponsiveness, or loss of consciousness. His 61 year old daughter died from a seizure in her sleep. There is no family history of any other neurological conditions that he recalls.   Laboratory Data: Lab Results  Component Value Date   WBC 5.7 03/27/2016   HGB 16.5 03/27/2016   HCT 49.0 03/27/2016   MCV 86.0 03/27/2016   PLT 215 03/27/2016     Chemistry      Component Value Date/Time   NA 135 03/28/2016 0312   K 4.3 03/28/2016 0312   CL 104 03/28/2016 0312   CO2 25 03/28/2016 0312   BUN 11 03/28/2016 0312   CREATININE 1.04 03/28/2016 0312      Component Value Date/Time   CALCIUM 8.9 03/28/2016 0312   ALKPHOS 52 03/27/2016 1038  AST 25 03/27/2016 1038   ALT 30 03/27/2016 1038   BILITOT 1.0 03/27/2016 1038     Lab Results  Component Value Date   ESRSEDRATE 4 03/28/2016   HIV antibody nonreactive.  PAST MEDICAL HISTORY: Past Medical History:  Diagnosis Date  . Headache 03/2016  . High blood pressure   .  High cholesterol     PAST SURGICAL HISTORY: Past Surgical History:  Procedure Laterality Date  . NO PAST SURGERIES      MEDICATIONS: Current Outpatient Prescriptions on File Prior to Visit  Medication Sig Dispense Refill  . atorvastatin (LIPITOR) 40 MG tablet Take 1 tablet (40 mg total) by mouth daily. 30 tablet 0  . ibuprofen (ADVIL,MOTRIN) 200 MG tablet Take 400 mg by mouth daily as needed (pain).    Marland Kitchen losartan (COZAAR) 25 MG tablet Take 25 mg by mouth daily.  0   No current facility-administered medications on file prior to visit.     ALLERGIES: No Known Allergies  FAMILY HISTORY: Family History  Problem Relation Age of Onset  . Hypertension Mother   . Hypertension Father     SOCIAL HISTORY: Social History   Social History  . Marital status: Single    Spouse name: N/A  . Number of children: N/A  . Years of education: N/A   Occupational History  . Not on file.   Social History Main Topics  . Smoking status: Current Every Day Smoker    Packs/day: 1.00    Types: Cigarettes  . Smokeless tobacco: Former Neurosurgeon  . Alcohol use No     Comment: stopped drinking last week  . Drug use: No  . Sexual activity: Not on file   Other Topics Concern  . Not on file   Social History Narrative   Lives alone in an apartment on the 10th floor.  Has 3 children but one is deceased.  Works at an Engineer, maintenance.  Education: 11th grade.    REVIEW OF SYSTEMS: Constitutional: No fevers, chills, or sweats, no generalized fatigue, change in appetite Eyes: No visual changes, double vision, eye pain Ear, nose and throat: No hearing loss, ear pain, nasal congestion, sore throat Cardiovascular: No chest pain, palpitations Respiratory:  No shortness of breath at rest or with exertion, wheezes GastrointestinaI: No nausea, vomiting, diarrhea, abdominal pain, fecal incontinence Genitourinary:  No dysuria, urinary retention or frequency Musculoskeletal:  No neck pain, back  pain Integumentary: No rash, pruritus, skin lesions Neurological: as above Psychiatric: No depression, insomnia, anxiety Endocrine: No palpitations, fatigue, diaphoresis, mood swings, change in appetite, change in weight, increased thirst Hematologic/Lymphatic:  No anemia, purpura, petechiae. Allergic/Immunologic: no itchy/runny eyes, nasal congestion, recent allergic reactions, rashes  PHYSICAL EXAM: Vitals:   05/15/16 1039  BP: 140/88  Pulse: 88  Resp: 16   General: No acute distress Head:  Normocephalic/atraumatic Eyes: Fundoscopic exam shows bilateral sharp discs, no vessel changes, exudates, or hemorrhages Neck: supple, no paraspinal tenderness, full range of motion Back: No paraspinal tenderness Heart: regular rate and rhythm Lungs: Clear to auscultation bilaterally. Vascular: No carotid bruits. Skin/Extremities: No rash, no edema Neurological Exam: Mental status: alert and oriented to person, place, and time, no dysarthria or aphasia, Fund of knowledge is appropriate.  Recent and remote memory are intact. 2/3 delayed recall.  Attention and concentration are normal.    Able to name objects and repeat phrases. Cranial nerves: CN I: not tested CN II: pupils equal, round and reactive to light, visual fields intact, fundi unremarkable. CN III,  IV, VI:  full range of motion, no nystagmus, no ptosis CN V: facial sensation intact CN VII: upper and lower face symmetric CN VIII: hearing intact to finger rub CN IX, X: gag intact, uvula midline CN XI: sternocleidomastoid and trapezius muscles intact CN XII: tongue midline Bulk & Tone: normal, no fasciculations. Motor: 5/5 throughout with no pronator drift. Sensation: intact to light touch, cold, pin, vibration and joint position sense.  No extinction to double simultaneous stimulation.  Romberg test negative Deep Tendon Reflexes: +2 throughout except for absent ankle jerks bilaterally, no ankle clonus Plantar responses: downgoing  bilaterally Cerebellar: no incoordination on finger to nose, heel to shin. No dysdiadochokinesia Gait: narrow-based and steady, able to tandem walk adequately. Tremor: none  IMPRESSION: This is a pleasant 60 year old right-handed man with a history of hypertension, hyperlipidemia, tobacco use, presenting for follow-up after he had an abnormal MRI brain when he presented to the ER for new onset headache. His MRI brain last month which showed bilateral supra and infratentorial lesions of unclear etiology, findings felt to resemble more a demyelinating or inflammatory process rather than ischemia. Infectious etiology such as viral encephalitis, as well as CNS lymphoma or metastatic disease were also considered. His neurological exam is normal. His history is pretty benign, the etiology of these lesions is unclear. He declined to do a lumbar puncture, and again today declines LP, understanding risks of potentially untreated condition. He does agree to repeating an MRI brain with and without contrast. We discussed that if the MRI findings show progression, I would strongly recommend doing a lumbar puncture. He expressed understanding and will follow-up in 2 months. He knows to call us for any changes in symptoms.    Thank you for allowing me to participate in the care of this patient. Please do not hesitate to call for any questions or concerns.   Patrcia Dolly, M.D.  CC: Dr. Hilda Blades

## 2016-05-16 ENCOUNTER — Encounter: Payer: Self-pay | Admitting: Neurology

## 2016-05-23 ENCOUNTER — Other Ambulatory Visit (HOSPITAL_COMMUNITY): Payer: PRIVATE HEALTH INSURANCE

## 2016-05-27 ENCOUNTER — Telehealth: Payer: Self-pay | Admitting: *Deleted

## 2016-05-27 NOTE — Telephone Encounter (Signed)
We have call this patient several time and he has no-showed on 8/2,cancel on 8/25 and no-showed on 05/23/16.

## 2016-06-19 ENCOUNTER — Encounter (HOSPITAL_COMMUNITY): Payer: Self-pay | Admitting: Emergency Medicine

## 2016-06-19 ENCOUNTER — Ambulatory Visit (HOSPITAL_COMMUNITY)
Admission: EM | Admit: 2016-06-19 | Discharge: 2016-06-19 | Disposition: A | Discharge status: Home / Self Care | Payer: PRIVATE HEALTH INSURANCE | Attending: Internal Medicine | Admitting: Internal Medicine

## 2016-06-19 DIAGNOSIS — M792 Neuralgia and neuritis, unspecified: Secondary | ICD-10-CM

## 2016-06-19 DIAGNOSIS — M79604 Pain in right leg: Secondary | ICD-10-CM | POA: Diagnosis not present

## 2016-06-19 DIAGNOSIS — M79605 Pain in left leg: Secondary | ICD-10-CM | POA: Diagnosis not present

## 2016-06-19 MED ORDER — HYDROCODONE-ACETAMINOPHEN 5-325 MG PO TABS: 2.0000 | ORAL_TABLET | ORAL | 0 refills | Status: DC | PRN

## 2016-06-19 MED ORDER — GABAPENTIN 600 MG PO TABS: 600.0000 mg | ORAL_TABLET | Freq: Three times a day (TID) | ORAL | 2 refills | Status: DC

## 2016-06-19 MED ORDER — CYCLOBENZAPRINE HCL 10 MG PO TABS: 10.0000 mg | ORAL_TABLET | Freq: Two times a day (BID) | ORAL | 0 refills | Status: DC | PRN

## 2016-06-19 NOTE — ED Provider Notes (Signed)
CSN: [id number]     Arrival date & time 06/19/16  1350 History   First MD Initiated Contact with Patient 06/19/16 1441     Chief Complaint  Patient presents with  . Leg Pain   (Consider location/radiation/quality/duration/timing/severity/associated sxs/prior Treatment) HPI Pt presents complaining of months of pain on the left side of his body that he does not feel his doctor has properly evaluated.  Pt states that at times he feels as if his entire left side is sleep. It takes him about an hour to make symptoms better. Has been on gabapentin, but pt states it only helps when he takes at least 300 mg. He has had no injury. No sxs of stroke. Smokes at least 1.5 ppd for many years.  Past Medical History:  Diagnosis Date  . Headache 03/2016  . High blood pressure   . High cholesterol    Past Surgical History:  Procedure Laterality Date  . NO PAST SURGERIES     Family History  Problem Relation Age of Onset  . Hypertension Mother   . Hypertension Father    Social History  Substance Use Topics  . Smoking status: Current Every Day Smoker    Packs/day: 1.00    Types: Cigarettes  . Smokeless tobacco: Former Neurosurgeon  . Alcohol use No     Comment: stopped drinking last week    Review of Systems  Denies: HEADACHE, NAUSEA, ABDOMINAL PAIN, CHEST PAIN, CONGESTION, DYSURIA, SHORTNESS OF BREATH Allergies  Review of patient's allergies indicates no known allergies.  Home Medications   Prior to Admission medications   Medication Sig Start Date End Date Taking? Authorizing Provider  atorvastatin (LIPITOR) 20 MG tablet Take 20 mg by mouth daily. 04/24/16   Historical Provider, MD  cyclobenzaprine (FLEXERIL) 10 MG tablet Take 1 tablet (10 mg total) by mouth 2 (two) times daily as needed for muscle spasms. 06/19/16   Tharon Aquas, PA  gabapentin (NEURONTIN) 100 MG capsule take 1 capsule by mouth twice a day if needed for LEG PAIN 05/09/16   Historical Provider, MD  gabapentin (NEURONTIN) 600 MG  tablet Take 1 tablet (600 mg total) by mouth 3 (three) times daily. 06/19/16   Tharon Aquas, PA  HYDROcodone-acetaminophen (NORCO/VICODIN) 5-325 MG tablet take 1 tablet by mouth every 4 to 6 hours if needed for pain 04/09/16   Historical Provider, MD  HYDROcodone-acetaminophen (NORCO/VICODIN) 5-325 MG tablet Take 2 tablets by mouth every 4 (four) hours as needed. 06/19/16   Tharon Aquas, PA  ibuprofen (ADVIL,MOTRIN) 200 MG tablet Take 400 mg by mouth daily as needed (pain).    Historical Provider, MD  losartan (COZAAR) 25 MG tablet Take 25 mg by mouth daily. 03/10/16   Historical Provider, MD  penicillin v potassium (VEETID) 500 MG tablet take 1 tablet by mouth four times a day until finished 04/09/16   Historical Provider, MD   Meds Ordered and Administered this Visit  Medications - No data to display  BP 150/90 (BP Location: Left Arm)   Pulse 73   Temp 98.4 F (36.9 C) (Oral)   Resp 16   SpO2 100%  No data found.   Physical Exam NURSES NOTES AND VITAL SIGNS REVIEWED. CONSTITUTIONAL: Well developed, well nourished, no acute distress HEENT: normocephalic, atraumatic EYES: Conjunctiva normal NECK:normal ROM, supple, no adenopathy PULMONARY:No respiratory distress, normal effort ABDOMINAL: Soft, ND, NT BS+, No CVAT MUSCULOSKELETAL: Normal ROM of all extremities,  SKIN: warm and dry without rash PSYCHIATRIC: Mood and affect, behavior  are normal NEUROLOGICAL SCREENING EXAM: Constitutional:  oriented to person, place, and time.  Neurological:  .  normal strength and normal reflexes. No cranial nerve deficit or sensory deficit. negative Romberg sign. GCS eye subscore is 4. GCS verbal subscore is 5. GCS motor subscore is 6.    Urgent Care Course   Clinical Course    Procedures (including critical care time)  Labs Review Labs Reviewed - No data to display  Imaging Review No results found.   Visual Acuity Review  Right Eye Distance:   Left Eye Distance:   Bilateral  Distance:    Right Eye Near:   Left Eye Near:    Bilateral Near:      This appears to be chronic radicular pain which may warrant evaluation by specialty care with MRI. I have tried to explain UC does not do this type of work up.    MDM   1. Bilateral leg pain   2. Radicular pain of left upper extremity     Patient is reassured that there are no issues that require transfer to higher level of care at this time or additional tests. Patient is advised to continue home symptomatic treatment. Patient is advised that if there are new or worsening symptoms to attend the emergency department, contact primary care provider, or return to UC. Instructions of care provided discharged home in stable condition.    THIS NOTE WAS GENERATED USING A VOICE RECOGNITION SOFTWARE PROGRAM. ALL REASONABLE EFFORTS  WERE MADE TO PROOFREAD THIS DOCUMENT FOR ACCURACY.  I have verbally reviewed the discharge instructions with the patient. A printed AVS was given to the patient.  All questions were answered prior to discharge.      Tharon AquasFrank C Lawrie Tunks, PA 06/19/16 1943

## 2016-06-19 NOTE — ED Triage Notes (Signed)
Patient has multiple complaints about both legs hurting, sharp, shooting pain, numb toes intermittently.  Patient complains of cramping in both legs.

## 2016-06-24 ENCOUNTER — Telehealth (HOSPITAL_COMMUNITY): Payer: Self-pay | Admitting: Emergency Medicine

## 2016-06-24 MED ORDER — GABAPENTIN 600 MG PO TABS: 600.0000 mg | ORAL_TABLET | Freq: Three times a day (TID) | ORAL | 2 refills | Status: DC

## 2016-06-24 MED ORDER — CYCLOBENZAPRINE HCL 10 MG PO TABS: 10.0000 mg | ORAL_TABLET | Freq: Two times a day (BID) | ORAL | 0 refills | Status: DC | PRN

## 2016-06-24 NOTE — Telephone Encounter (Signed)
Returned pt's call  Pt reports he washed his Rx given to him on 06/19/16  Notified pt that I was unable to fill his Rx and especially the Hydrocodone... Pt got upset and stated he spoke w/somebody yest and was told they could call it in  Did not see any phone notes from yest  Asked Jamse MeadFrank P, PA for further instructions... Homero FellersFrank was willing to speak to pt  Per Homero FellersFrank, ok to call in Flexeril and Gabapentin only and he will NOT write another Rx for Hydrocodone  [facility] [facility] [facility]

## 2016-07-16 ENCOUNTER — Ambulatory Visit: Payer: PRIVATE HEALTH INSURANCE | Admitting: Neurology

## 2016-07-18 ENCOUNTER — Encounter: Payer: Self-pay | Admitting: Neurology

## 2016-07-18 ENCOUNTER — Ambulatory Visit (INDEPENDENT_AMBULATORY_CARE_PROVIDER_SITE_OTHER): Payer: PRIVATE HEALTH INSURANCE | Admitting: Neurology

## 2016-07-18 VITALS — BP 136/78 | HR 81 | Ht 68.5 in | Wt 191.3 lb

## 2016-07-18 DIAGNOSIS — R9089 Other abnormal findings on diagnostic imaging of central nervous system: Secondary | ICD-10-CM | POA: Diagnosis not present

## 2016-07-18 NOTE — Patient Instructions (Signed)
1. Schedule repeat open MRI brain with and without contrast 2. Follow-up in 3 months, call for any changes

## 2016-07-18 NOTE — Progress Notes (Signed)
NEUROLOGY FOLLOW UP OFFICE NOTE  Patrick Gilbert [id number]  HISTORY OF PRESENT ILLNESS: I had the pleasure of seeing Patrick Gilbert in follow-up in the neurology clinic on 07/18/2016.  The patient was last seen 2 months ago for abnormal MRI findings when he presented to the ER for headaches. His MRI brain in July had shown supratentorial and infratentorial FLAIR changes in the cortical and subcortical regions, some with possible restricted diffusion around the margins. These could be seen with a more demyelinating or inflammatory process, or with infectious or neoplastic cause. He refused a lumbar puncture. He was scheduled for a repeat MRI brain on his last visit but did not follow through. Since his last visit, he continues to do well and denies any headaches, dizziness, vision changes, focal numbness/tingling/weakness, bowel/bladder dysfunction. His main complaint today is some arthritis, relieved with NSAIDs. No falls.   HPI 05/15/2016: This is a pleasant 60 yo RH man with a history of hypertension, hyperlipidemia, tobacco use, who presented after a hospital visit for headache, found to have abnormal MRI brain. He states today that he felt "hot" that day and denies having a headache. Records from his ER visit were reviewed, he presented with a 4-day history of headaches, he had profuse diaphoresis then had a headache unrelieved by Ibuprofen. He reported a dull left-sided headache with some nausea. He was also reporting abdominal cramping. As part of his workup, he had a head CT which showed an area of hypoattenuation in the right occipital and right cerebellar regions. He underwent an MRI brain without contrast to further evaluate these lesions, I personally reviewed images, which showed right cerebellar peduncle lesions with possible restricted diffusion around the margins, bilateral nodular and indistinct periventricular FLAIR hyperintensities, more in the left temporal lobe. Corpus callosum spared.  There are more subcortical white matter lesions in both superior frontal gyri, greater on the left, also involvement at the right superior peri-Rolandic gyrus white matter and perhaps cortex. There is a superimposed punctate focus of increased trace diffusion in the right posterior thalamus. Findings resemble more a demyelinating or inflammatory process rather than ischemia. Infectious etiology such as viral encephalitis should be considered. CNS lymphoma or metastatic disease considered but felt unlikely. He refused to have a lumbar puncture and was discharged home with outpatient neurology follow-up.  He denies any prior episodes of focal symptoms or vision loss. He denies any recent travels, immunizations, febrile illness, sick contacts. He lives alone with a Gaynelle Cageparakeet he has owned for 10 years. He denies any falls. He denies any episodes of staring/unresponsiveness, or loss of consciousness. His 61 year old daughter died from a seizure in her sleep. There is no family history of any other neurological conditions that he recalls.   PAST MEDICAL HISTORY: Past Medical History:  Diagnosis Date  . Headache 03/2016  . High blood pressure   . High cholesterol     MEDICATIONS: Current Outpatient Prescriptions on File Prior to Visit  Medication Sig Dispense Refill  . atorvastatin (LIPITOR) 20 MG tablet Take 20 mg by mouth daily.  0  . cyclobenzaprine (FLEXERIL) 10 MG tablet Take 1 tablet (10 mg total) by mouth 2 (two) times daily as needed for muscle spasms. 20 tablet 0  . losartan (COZAAR) 25 MG tablet Take 25 mg by mouth daily.  0  . gabapentin (NEURONTIN) 600 MG tablet Take 1 tablet (600 mg total) by mouth 3 (three) times daily. (Patient not taking: Reported on 07/18/2016) 90 tablet 2  .  HYDROcodone-acetaminophen (NORCO/VICODIN) 5-325 MG tablet take 1 tablet by mouth every 4 to 6 hours if needed for pain  0  . HYDROcodone-acetaminophen (NORCO/VICODIN) 5-325 MG tablet Take 2 tablets by mouth every 4  (four) hours as needed. (Patient not taking: Reported on 07/18/2016) 30 tablet 0  . ibuprofen (ADVIL,MOTRIN) 200 MG tablet Take 400 mg by mouth daily as needed (pain).    Marland Kitchen penicillin v potassium (VEETID) 500 MG tablet take 1 tablet by mouth four times a day until finished  0   No current facility-administered medications on file prior to visit.     ALLERGIES: No Known Allergies  FAMILY HISTORY: Family History  Problem Relation Age of Onset  . Hypertension Mother   . Hypertension Father     SOCIAL HISTORY: Social History   Social History  . Marital status: Single    Spouse name: N/A  . Number of children: N/A  . Years of education: N/A   Occupational History  . Not on file.   Social History Main Topics  . Smoking status: Current Every Day Smoker    Packs/day: 1.00    Types: Cigarettes  . Smokeless tobacco: Former Neurosurgeon  . Alcohol use No     Comment: stopped drinking last week  . Drug use: No  . Sexual activity: Not on file   Other Topics Concern  . Not on file   Social History Narrative   Lives alone in an apartment on the 10th floor.  Has 3 children but one is deceased.  Works at an Engineer, maintenance.  Education: 11th grade.    REVIEW OF SYSTEMS: Constitutional: No fevers, chills, or sweats, no generalized fatigue, change in appetite Eyes: No visual changes, double vision, eye pain Ear, nose and throat: No hearing loss, ear pain, nasal congestion, sore throat Cardiovascular: No chest pain, palpitations Respiratory:  No shortness of breath at rest or with exertion, wheezes GastrointestinaI: No nausea, vomiting, diarrhea, abdominal pain, fecal incontinence Genitourinary:  No dysuria, urinary retention or frequency Musculoskeletal:  No neck pain, back pain Integumentary: No rash, pruritus, skin lesions Neurological: as above Psychiatric: No depression, insomnia, anxiety Endocrine: No palpitations, fatigue, diaphoresis, mood swings, change in appetite, change in  weight, increased thirst Hematologic/Lymphatic:  No anemia, purpura, petechiae. Allergic/Immunologic: no itchy/runny eyes, nasal congestion, recent allergic reactions, rashes  PHYSICAL EXAM: Vitals:   07/18/16 1101  BP: 136/78  Pulse: 81   General: No acute distress Head:  Normocephalic/atraumatic Neck: supple, no paraspinal tenderness, full range of motion Heart:  Regular rate and rhythm Lungs:  Clear to auscultation bilaterally Back: No paraspinal tenderness Skin/Extremities: No rash, no edema Neurological Exam: alert and oriented to person, place, and time. No aphasia or dysarthria. Fund of knowledge is appropriate.  Recent and remote memory are intact.  Attention and concentration are normal.    Able to name objects and repeat phrases. Cranial nerves: Pupils equal, round, reactive to light.  Fundoscopic exam unremarkable, no papilledema. Extraocular movements intact with no nystagmus. Visual fields full. Facial sensation intact. No facial asymmetry. Tongue, uvula, palate midline.  Motor: Bulk and tone normal, muscle strength 5/5 throughout with no pronator drift.  Sensation to light touch, temperature intact.  No extinction to double simultaneous stimulation.  Deep tendon reflexes 2+ throughout, toes downgoing.  Finger to nose testing intact.  Gait narrow-based and steady, able to tandem walk adequately.  Romberg negative.  IMPRESSION: This is a pleasant 60 yo RH man with a history of hypertension, hyperlipidemia, tobacco use, who  went to the ER for headaches last July, found to have an abnormal MRI brain with bilateral supra and infratentorial lesions of unclear etiology, findings felt to resemble more a demyelinating or inflammatory process rather than ischemia. Infectious etiology such as viral encephalitis, as well as CNS lymphoma or metastatic disease were also considered. His neurological exam is again normal. The etiology of these lesions is unclear, he has not had the MRI brain with  and without contrast done yet and will proceed as planned.. He had declined to do a lumbar puncture, and agrees to consider if repeat MRI continues to show similar changes. He will follow up in 3 months and knows to call us for any changes in symptoms.    Thank you for allowing me to participate in his care.  Please do not hesitate to call for any questions or concerns.  The duration of this appointment visit was 15 minutes of face-to-face time with the patient.  Greater than 50% of this time was spent in counseling, explanation of diagnosis, planning of further management, and coordination of care.   Patrcia Dolly, M.D.

## 2016-07-31 ENCOUNTER — Emergency Department (HOSPITAL_COMMUNITY): Payer: PRIVATE HEALTH INSURANCE

## 2016-07-31 ENCOUNTER — Emergency Department (HOSPITAL_COMMUNITY)
Admission: EM | Admit: 2016-07-31 | Discharge: 2016-07-31 | Disposition: A | Discharge status: Home / Self Care | Payer: PRIVATE HEALTH INSURANCE | Attending: Emergency Medicine | Admitting: Emergency Medicine

## 2016-07-31 ENCOUNTER — Encounter (HOSPITAL_COMMUNITY): Payer: Self-pay | Admitting: Emergency Medicine

## 2016-07-31 ENCOUNTER — Telehealth: Payer: Self-pay | Admitting: Neurology

## 2016-07-31 DIAGNOSIS — F17219 Nicotine dependence, cigarettes, with unspecified nicotine-induced disorders: Secondary | ICD-10-CM | POA: Diagnosis not present

## 2016-07-31 DIAGNOSIS — R791 Abnormal coagulation profile: Secondary | ICD-10-CM | POA: Diagnosis not present

## 2016-07-31 DIAGNOSIS — R202 Paresthesia of skin: Secondary | ICD-10-CM | POA: Insufficient documentation

## 2016-07-31 DIAGNOSIS — R200 Anesthesia of skin: Secondary | ICD-10-CM | POA: Diagnosis present

## 2016-07-31 LAB — COMPREHENSIVE METABOLIC PANEL
ALK PHOS: 54 U/L (ref 38–126)
ALT: 38 U/L (ref 17–63)
ANION GAP: 8 (ref 5–15)
AST: 22 U/L (ref 15–41)
Albumin: 3.9 g/dL (ref 3.5–5.0)
BILIRUBIN TOTAL: 0.8 mg/dL (ref 0.3–1.2)
BUN: 12 mg/dL (ref 6–20)
CALCIUM: 9.7 mg/dL (ref 8.9–10.3)
CO2: 22 mmol/L (ref 22–32)
CREATININE: 0.99 mg/dL (ref 0.61–1.24)
Chloride: 108 mmol/L (ref 101–111)
GFR calc non Af Amer: >60 mL/min (ref >60)
Glucose, Bld: 116 mg/dL — ABNORMAL HIGH (ref 65–99)
Potassium: 3.8 mmol/L (ref 3.5–5.1)
Sodium: 138 mmol/L (ref 135–145)
TOTAL PROTEIN: 6.8 g/dL (ref 6.5–8.1)

## 2016-07-31 LAB — APTT: aPTT: 29 seconds (ref 24–36)

## 2016-07-31 LAB — DIFFERENTIAL
Basophils Absolute: 0 10*3/uL (ref 0.0–0.1)
Basophils Relative: 0 %
EOS PCT: 5 %
Eosinophils Absolute: 0.3 10*3/uL (ref 0.0–0.7)
LYMPHS ABS: 1.7 10*3/uL (ref 0.7–4.0)
LYMPHS PCT: 33 %
MONO ABS: 0.6 10*3/uL (ref 0.1–1.0)
MONOS PCT: 12 %
Neutro Abs: 2.7 10*3/uL (ref 1.7–7.7)
Neutrophils Relative %: 50 %

## 2016-07-31 LAB — CBC
HEMATOCRIT: 44.7 % (ref 39.0–52.0)
HEMOGLOBIN: 15.2 g/dL (ref 13.0–17.0)
MCH: 29 pg (ref 26.0–34.0)
MCHC: 34 g/dL (ref 30.0–36.0)
MCV: 85.3 fL (ref 78.0–100.0)
Platelets: 189 10*3/uL (ref 150–400)
RBC: 5.24 MIL/uL (ref 4.22–5.81)
RDW: 14 % (ref 11.5–15.5)
WBC: 5.2 10*3/uL (ref 4.0–10.5)

## 2016-07-31 LAB — I-STAT CHEM 8, ED
BUN: 13 mg/dL (ref 6–20)
CALCIUM ION: 1.24 mmol/L (ref 1.15–1.40)
Chloride: 105 mmol/L (ref 101–111)
Creatinine, Ser: 0.9 mg/dL (ref 0.61–1.24)
GLUCOSE: 112 mg/dL — AB (ref 65–99)
HCT: 47 % (ref 39.0–52.0)
Hemoglobin: 16 g/dL (ref 13.0–17.0)
Potassium: 3.8 mmol/L (ref 3.5–5.1)
SODIUM: 139 mmol/L (ref 135–145)
TCO2: 22 mmol/L (ref 0–100)

## 2016-07-31 LAB — PROTIME-INR
INR: 0.94
PROTHROMBIN TIME: 12.6 s (ref 11.4–15.2)

## 2016-07-31 LAB — ETHANOL: Alcohol, Ethyl (B): <5 mg/dL (ref <5)

## 2016-07-31 LAB — CBG MONITORING, ED: GLUCOSE-CAPILLARY: 153 mg/dL — AB (ref 65–99)

## 2016-07-31 LAB — I-STAT TROPONIN, ED: Troponin i, poc: 0 ng/mL (ref 0.00–0.08)

## 2016-07-31 NOTE — ED Provider Notes (Signed)
[facility]-EMERGENCY DEPT Provider Note   CSN: [id number] Arrival date & time: 07/31/16  [id number]     History   Chief Complaint Chief Complaint  Patient presents with  . Numbness    HPI Patrick Gilbert is a 60 y.o. male.  60 yo M with a cc of L arm tingling.  Going on for past couple hours.  Patient has a history of the same in the past. This been going on for at least the past couple years. See neurology for the same. He felt that the tingling did not improve significantly in the normal amount of time. So he called 911. He denies any elbow pain shoulder pain. He did become concerned that the symptoms were not improving and felt some tingling around his mouth. Has also has tingling down into his legs. Denies any head injury denies neck pain.   The history is provided by the patient.  Illness  This is a new problem. The current episode started less than 1 hour ago. The problem occurs constantly. The problem has been rapidly improving. Pertinent negatives include no chest pain, no abdominal pain, no headaches and no shortness of breath. Nothing aggravates the symptoms. Nothing relieves the symptoms. He has tried nothing for the symptoms. The treatment provided no relief.    Past Medical History:  Diagnosis Date  . Headache 03/2016  . High blood pressure   . High cholesterol     Patient Active Problem List   Diagnosis Date Noted  . Abnormal brain MRI   . Chest pain 03/27/2016  . AKI (acute kidney injury) (HCC) 03/27/2016  . Headache 03/27/2016  . Tobacco abuse 03/27/2016  . Abnormal EKG   . Cephalalgia   . Pain in the chest     Past Surgical History:  Procedure Laterality Date  . NO PAST SURGERIES         Home Medications    Prior to Admission medications   Medication Sig Start Date End Date Taking? Authorizing Provider  atorvastatin (LIPITOR) 20 MG tablet Take 20 mg by mouth daily. 04/24/16   Historical Provider, MD  cyclobenzaprine (FLEXERIL) 10 MG tablet Take 1 tablet (10  mg total) by mouth 2 (two) times daily as needed for muscle spasms. 06/24/16   Tharon AquasFrank C Patrick, PA  gabapentin (NEURONTIN) 600 MG tablet Take 1 tablet (600 mg total) by mouth 3 (three) times daily. Patient not taking: Reported on 07/18/2016 06/24/16   Tharon AquasFrank C Patrick, PA  HYDROcodone-acetaminophen (NORCO/VICODIN) 5-325 MG tablet take 1 tablet by mouth every 4 to 6 hours if needed for pain 04/09/16   Historical Provider, MD  HYDROcodone-acetaminophen (NORCO/VICODIN) 5-325 MG tablet Take 2 tablets by mouth every 4 (four) hours as needed. Patient not taking: Reported on 07/18/2016 06/19/16   Tharon AquasFrank C Patrick, PA  ibuprofen (ADVIL,MOTRIN) 200 MG tablet Take 400 mg by mouth daily as needed (pain).    Historical Provider, MD  losartan (COZAAR) 25 MG tablet Take 25 mg by mouth daily. 03/10/16   Historical Provider, MD  penicillin v potassium (VEETID) 500 MG tablet take 1 tablet by mouth four times a day until finished 04/09/16   Historical Provider, MD    Family History Family History  Problem Relation Age of Onset  . Hypertension Mother   . Hypertension Father     Social History Social History  Substance Use Topics  . Smoking status: Current Every Day Smoker    Packs/day: 1.00    Types: Cigarettes  . Smokeless tobacco: Former  User  . Alcohol use No     Comment: stopped drinking last week     Allergies   Patient has no known allergies.   Review of Systems Review of Systems  Constitutional: Negative for chills and fever.  HENT: Negative for congestion and facial swelling.   Eyes: Negative for discharge and visual disturbance.  Respiratory: Negative for shortness of breath.   Cardiovascular: Negative for chest pain and palpitations.  Gastrointestinal: Negative for abdominal pain, diarrhea and vomiting.  Musculoskeletal: Negative for arthralgias and myalgias.  Skin: Negative for color change and rash.  Neurological: Positive for numbness (paresthesia). Negative for tremors, syncope and  headaches.  Psychiatric/Behavioral: Negative for confusion and dysphoric mood.     Physical Exam Updated Vital Signs BP 139/85 (BP Location: Right Arm)   Pulse 77   Temp 97.7 F (36.5 C)   Resp 19   Ht 5\' 6"  (1.676 m)   Wt 200 lb (90.7 kg)   SpO2 100%   BMI 32.28 kg/m   Physical Exam  Constitutional: He is oriented to person, place, and time. He appears well-developed and well-nourished.  HENT:  Head: Normocephalic and atraumatic.  Eyes: EOM are normal. Pupils are equal, round, and reactive to light.  Neck: Normal range of motion. Neck supple. No JVD present.  Cardiovascular: Normal rate and regular rhythm.  Exam reveals no gallop and no friction rub.   No murmur heard. Pulmonary/Chest: No respiratory distress. He has no wheezes.  Abdominal: He exhibits no distension. There is no rebound and no guarding.  Musculoskeletal: Normal range of motion.  Neurological: He is alert and oriented to person, place, and time. He has normal strength. No cranial nerve deficit or sensory deficit. He displays a negative Romberg sign. GCS eye subscore is 4. GCS verbal subscore is 5. GCS motor subscore is 6. He displays no Babinski's sign on the right side. He displays no Babinski's sign on the left side.  Reflex Scores:      Tricep reflexes are 2+ on the right side and 2+ on the left side.      Bicep reflexes are 2+ on the right side and 2+ on the left side.      Brachioradialis reflexes are 2+ on the right side and 2+ on the left side.      Patellar reflexes are 2+ on the right side and 2+ on the left side.      Achilles reflexes are 2+ on the right side and 2+ on the left side. Negative Spurling's. No noted neck tenderness.  Skin: No rash noted. No pallor.  Psychiatric: He has a normal mood and affect. His behavior is normal.  Nursing note and vitals reviewed.    ED Treatments / Results  Labs (all labs ordered are listed, but only abnormal results are displayed) Labs Reviewed    COMPREHENSIVE METABOLIC PANEL - Abnormal; Notable for the following:       Result Value   Glucose, Bld 116 (*)    All other components within normal limits  I-STAT CHEM 8, ED - Abnormal; Notable for the following:    Glucose, Bld 112 (*)    All other components within normal limits  CBG MONITORING, ED - Abnormal; Notable for the following:    Glucose-Capillary 153 (*)    All other components within normal limits  ETHANOL  PROTIME-INR  APTT  CBC  DIFFERENTIAL  RAPID URINE DRUG SCREEN, HOSP PERFORMED  URINALYSIS, ROUTINE W REFLEX MICROSCOPIC (NOT AT [facility])  I-STAT  TROPOININ, ED    EKG  EKG Interpretation  Date/Time:  Thursday July 31 2016 07:11:50 EST Ventricular Rate:  76 PR Interval:    QRS Duration: 83 QT Interval:  344 QTC Calculation: 387 R Axis:   64 Text Interpretation:  Sinus rhythm LAE, consider biatrial enlargement Nonspecific T abnormalities, inferior leads No significant change was found Confirmed by Manus Gunning  MD, STEPHEN [phone]) on 07/31/2016 7:21:21 AM       Radiology Ct Head Code Stroke W/o Cm  Result Date: 07/31/2016 CLINICAL DATA:  Code stroke.  Left-sided numbness and tingling. EXAM: CT HEAD WITHOUT CONTRAST TECHNIQUE: Contiguous axial images were obtained from the base of the skull through the vertex without intravenous contrast. COMPARISON:  03/27/2016 CT and MRI. FINDINGS: Brain: Low-density is again seen right middle cerebellar peduncle region. No other posterior fossa abnormality. Low-density is seen in the deep white matter adjacent to the occipital horn of the right lateral ventricle as previously demonstrated. The remainder of the exam is normal by CT. Vascular: No hyperdense vessels. Minimal atherosclerotic calcification of the major vessels at the base of the brain. Skull: Normal Sinuses/Orbits: Mild sinus mucosal thickening. No advanced inflammatory disease. Orbits negative. Other: None significant ASPECTS (Alberta Stroke Program Early CT Score) -  Ganglionic level infarction (caudate, lentiform nuclei, internal capsule, insula, M1-M3 cortex): 7 - Supraganglionic infarction (M4-M6 cortex): 3 Total score (0-10 with 10 being normal): 10 IMPRESSION: 1. No sign of acute infarction by CT. Abnormal low density in the right middle cerebellar peduncle and in the white matter adjacent to the occipital horn of right lateral ventricle as seen previously. MRI showed a multifocal white matter process felt to be demyelinating disease or postinflammatory change. 2. ASPECTS is 10. I am in the process of calling this report presently. Electronically Signed   By: Paulina Fusi M.D.   On: 07/31/2016 07:23    Procedures Procedures (including critical care time)  Medications Ordered in ED Medications - No data to display   Initial Impression / Assessment and Plan / ED Course  I have reviewed the triage vital signs and the nursing notes.  Pertinent labs & imaging results that were available during my care of the patient were reviewed by me and considered in my medical decision making (see chart for details).  Clinical Course     60 yo M With a chief complaints of paresthesias. This is a chronic issue for this patient. Urinary sees neurology for this. He was made a code stroke in the field. He was seen by neurology in the ED and construct was called off. CT of the head with no significant findings. There was some concern on a prior MRI for possible viral encephalopathy. Patient has been refusing lumbar puncture. Benign neuro exam for me. Suggested that he follow-up with his neurologist for continued evaluation for his paresthesias.  8:02 AM:  I have discussed the diagnosis/risks/treatment options with the patient and believe the pt to be eligible for discharge home to follow-up with Neuro. We also discussed returning to the ED immediately if new or worsening sx occur. We discussed the sx which are most concerning (e.g., sudden worsening symptoms) that necessitate  immediate return. Medications administered to the patient during their visit and any new prescriptions provided to the patient are listed below.  Medications given during this visit Medications - No data to display   The patient appears reasonably screen and/or stabilized for discharge and I doubt any other medical condition or other [facility] requiring further  screening, evaluation, or treatment in the ED at this time prior to discharge.    Final Clinical Impressions(s) / ED Diagnoses   Final diagnoses:  Paresthesia    New Prescriptions New Prescriptions   No medications on file     Melene Plan, DO 07/31/16 0802

## 2016-07-31 NOTE — Telephone Encounter (Signed)
Pls see how he is doing, if still weaker, go back to ER and tell them he is still having a hard time with left side.He needs as MRI brain as we had discussed in office. If symptoms are better, go ahead and have the MRI brain with and without contrast that we wanted done scheduled asap. Thanks

## 2016-07-31 NOTE — ED Notes (Signed)
Pt given urinal to provide sample. Reports "unable to pee without something to drink." passed swallow screen, given water.

## 2016-07-31 NOTE — Telephone Encounter (Signed)
Pt called and stated he went to the hospital by EMS this morning - thought he was having a stroke.  Had left side tingling in leg.  He said his left side does not work well like his right side.  He got up and had to hold on to something this morning as he walked around.  ER said to follow up with Dr. Karel Jarvis.  No appt. Was given, will wait for provider to give instruction and call pat back.

## 2016-07-31 NOTE — ED Notes (Signed)
Pt wheeled to waiting room to await transportation. NAD. No neuro deficits. Verbalizes understanding of follow up information and discharge instructions.

## 2016-07-31 NOTE — Telephone Encounter (Signed)
Patient's last OV was 11/3 and follow-up is 10/20/16. Please advise.

## 2016-07-31 NOTE — Telephone Encounter (Signed)
Contacted patient. Advised him if still having weakness or any issues with left side to go back to ER. He states the weakness has improved but he is having really bad left leg pain and wants to know if he can be prescribed anything. Advised patient to schedule the MRI ASAP. Gave patient [facility]'s number. Patient states he will schedule today.

## 2016-07-31 NOTE — Discharge Instructions (Signed)
Follow up with your neurologist.

## 2016-07-31 NOTE — ED Provider Notes (Signed)
MSE was initiated and I personally evaluated the patient and placed orders (if any) at  6:55 AM on July 31, 2016.  Patient presents by EMS as code stroke. States he woke normally at 5 AM. approximately 5:45 AM he developed numbness and tingling to his left arm and leg with feeling of "deadness". States this happened previously when he was told he may have had a small stroke. There is no difficulty speaking or swallowing. There is no weakness in strength. There is no facial droop. Denies headache.  Denies chest pain or shortness of breath.  Patient is awake and alert. Cranial nerves II-12 are intact. There is decreased sensation to left arm and leg. Equal grip strengths throughout 5 out of 5 strength throughout.   code stroke orders placed. Patient sent to CT scan.  The patient appears stable so that the remainder of the MSE may be completed by another provider.   Glynn Octave, MD 07/31/16 [phone]

## 2016-07-31 NOTE — ED Triage Notes (Signed)
Per EMS, pt from home with c/o left hand/arm numbness, dizziness and HTN. Pt A&O x 4, moves all extremities normally. [id number]. CBG-161, BP-170/90, HR-80.

## 2016-08-04 ENCOUNTER — Telehealth: Payer: Self-pay | Admitting: Neurology

## 2016-08-04 NOTE — Telephone Encounter (Signed)
Can only recommend Aleve or Ibuprofen for the pain. Otherwise, would call his PCP thanks

## 2016-08-04 NOTE — Telephone Encounter (Signed)
Notified patient of below. He states he still did not schedule MRI. Called [facility] and it was scheduled for Dec 4th but had it moved up to 08/06/16 at 2:00pm. Patient notified and given address [address]. He states he was told in ER 07/31/16 "there is left side nerve damage and he wants a scan or testing for this as well".

## 2016-08-04 NOTE — Telephone Encounter (Signed)
Let's start with MRI brain first. Thanks

## 2016-08-04 NOTE — Telephone Encounter (Signed)
Notified patient.

## 2016-08-04 NOTE — Telephone Encounter (Signed)
Contacted patient and gave him [facility]'s number to call and see if MRI appointment can be changed.

## 2016-08-04 NOTE — Telephone Encounter (Signed)
Patrick Gilbert May 24, 2056. He was calling back to speak with you regarding his MRI. He said he was hoping to have his MRI scheduled on a Friday due to him being off on Friday's. His # is [phone]. Thank you

## 2016-08-05 ENCOUNTER — Telehealth: Payer: Self-pay | Admitting: Neurology

## 2016-08-05 NOTE — Telephone Encounter (Signed)
Contacted patient. He states he wants to have MRI done Friday instead of tomorrow. Advised patient to call [facility] and gave him their number again [phone]). Explained to patient we did not do the scheduling here and they may not have anything available that day so it would be best if he called them to pick a date and time best for his schedule.

## 2016-08-05 NOTE — Telephone Encounter (Signed)
PT called and said he needs to reschedule his MRI/Dawn CB# [phone]

## 2016-09-11 ENCOUNTER — Other Ambulatory Visit: Payer: Self-pay | Admitting: Family Medicine

## 2016-09-11 DIAGNOSIS — R0989 Other specified symptoms and signs involving the circulatory and respiratory systems: Secondary | ICD-10-CM

## 2016-09-16 ENCOUNTER — Ambulatory Visit
Admission: RE | Admit: 2016-09-16 | Discharge: 2016-09-16 | Disposition: A | Discharge status: Home / Self Care | Payer: PRIVATE HEALTH INSURANCE | Source: Ambulatory Visit | Attending: Family Medicine | Admitting: Family Medicine

## 2016-09-16 DIAGNOSIS — R0989 Other specified symptoms and signs involving the circulatory and respiratory systems: Secondary | ICD-10-CM

## 2016-09-23 ENCOUNTER — Other Ambulatory Visit: Payer: Self-pay

## 2016-09-23 DIAGNOSIS — I739 Peripheral vascular disease, unspecified: Secondary | ICD-10-CM

## 2016-10-20 ENCOUNTER — Encounter: Payer: Self-pay | Admitting: *Deleted

## 2016-10-20 ENCOUNTER — Ambulatory Visit (INDEPENDENT_AMBULATORY_CARE_PROVIDER_SITE_OTHER): Payer: PRIVATE HEALTH INSURANCE | Admitting: Neurology

## 2016-10-20 ENCOUNTER — Encounter: Payer: Self-pay | Admitting: Neurology

## 2016-10-20 VITALS — BP 148/72 | HR 79 | Ht 66.0 in | Wt 197.4 lb

## 2016-10-20 DIAGNOSIS — R9089 Other abnormal findings on diagnostic imaging of central nervous system: Secondary | ICD-10-CM | POA: Diagnosis not present

## 2016-10-20 NOTE — Patient Instructions (Signed)
Once finances are more settled, recommend we proceed with the brain scan. Call our office at that point so we can send order for the brain scan. Follow-up in 6 months, call for any changes.

## 2016-10-20 NOTE — Progress Notes (Signed)
NEUROLOGY FOLLOW UP OFFICE NOTE  Patrick Gilbert [id number]  HISTORY OF PRESENT ILLNESS: I had the pleasure of seeing [name] in follow-up in the neurology clinic on 10/20/2016.  The patient was last seen 3 months ago for abnormal MRI findings when he presented to the ER for headaches. His MRI brain in July had shown supratentorial and infratentorial FLAIR changes in the cortical and subcortical regions, some with possible restricted diffusion around the margins. These could be seen with a more demyelinating or inflammatory process, or with infectious or neoplastic cause. He refused a lumbar puncture. He is still unable to do the repeat MRI, reporting today that his co-pay is high. He was in the ER last November for left leg pain, he is now on muscle relaxants and reports it occasionally bothers him, his leg "catches," no associated numbness/tingling/weakness. He denies any bowel/bladder dysfunction. He denies any headaches, dizziness, vision changes, no falls.   HPI 05/15/2016: This is a pleasant 61 yo RH man with a history of hypertension, hyperlipidemia, tobacco use, who presented after a hospital visit for headache, found to have abnormal MRI brain. He states today that he felt "hot" that day and denies having a headache. Records from his ER visit were reviewed, he presented with a 4-day history of headaches, he had profuse diaphoresis then had a headache unrelieved by Ibuprofen. He reported a dull left-sided headache with some nausea. He was also reporting abdominal cramping. As part of his workup, he had a head CT which showed an area of hypoattenuation in the right occipital and right cerebellar regions. He underwent an MRI brain without contrast to further evaluate these lesions, I personally reviewed images, which showed right cerebellar peduncle lesions with possible restricted diffusion around the margins, bilateral nodular and indistinct periventricular FLAIR hyperintensities, more in the  left temporal lobe. Corpus callosum spared. There are more subcortical white matter lesions in both superior frontal gyri, greater on the left, also involvement at the right superior peri-Rolandic gyrus white matter and perhaps cortex. There is a superimposed punctate focus of increased trace diffusion in the right posterior thalamus. Findings resemble more a demyelinating or inflammatory process rather than ischemia. Infectious etiology such as viral encephalitis should be considered. CNS lymphoma or metastatic disease considered but felt unlikely. He refused to have a lumbar puncture and was discharged home with outpatient neurology follow-up.  He denies any prior episodes of focal symptoms or vision loss. He denies any recent travels, immunizations, febrile illness, sick contacts. He lives alone with a Gaynelle Cage he has owned for 10 years. He denies any falls. He denies any episodes of staring/unresponsiveness, or loss of consciousness. His 62 year old daughter died from a seizure in her sleep. There is no family history of any other neurological conditions that he recalls.   PAST MEDICAL HISTORY: Past Medical History:  Diagnosis Date  . Headache 03/2016  . High blood pressure   . High cholesterol     MEDICATIONS: Current Outpatient Prescriptions on File Prior to Visit  Medication Sig Dispense Refill  . atorvastatin (LIPITOR) 20 MG tablet Take 20 mg by mouth daily.  0  . cyclobenzaprine (FLEXERIL) 10 MG tablet Take 1 tablet (10 mg total) by mouth 2 (two) times daily as needed for muscle spasms. 20 tablet 0  . gabapentin (NEURONTIN) 600 MG tablet Take 1 tablet (600 mg total) by mouth 3 (three) times daily. (Patient not taking: Reported on 07/18/2016) 90 tablet 2  . HYDROcodone-acetaminophen (NORCO/VICODIN) 5-325 MG tablet  take 1 tablet by mouth every 4 to 6 hours if needed for pain  0  . HYDROcodone-acetaminophen (NORCO/VICODIN) 5-325 MG tablet Take 2 tablets by mouth every 4 (four) hours as  needed. (Patient not taking: Reported on 07/18/2016) 30 tablet 0  . ibuprofen (ADVIL,MOTRIN) 200 MG tablet Take 400 mg by mouth daily as needed (pain).    Marland Kitchen losartan (COZAAR) 25 MG tablet Take 25 mg by mouth daily.  0  . penicillin v potassium (VEETID) 500 MG tablet take 1 tablet by mouth four times a day until finished  0   No current facility-administered medications on file prior to visit.     ALLERGIES: No Known Allergies  FAMILY HISTORY: Family History  Problem Relation Age of Onset  . Hypertension Mother   . Hypertension Father     SOCIAL HISTORY: Social History   Social History  . Marital status: Single    Spouse name: N/A  . Number of children: N/A  . Years of education: N/A   Occupational History  . Not on file.   Social History Main Topics  . Smoking status: Current Every Day Smoker    Packs/day: 1.00    Types: Cigarettes  . Smokeless tobacco: Former Neurosurgeon  . Alcohol use No     Comment: stopped drinking last week  . Drug use: Yes    Frequency: 1.0 time per week    Types: Marijuana  . Sexual activity: Not on file   Other Topics Concern  . Not on file   Social History Narrative   Lives alone in an apartment on the 10th floor.  Has 3 children but one is deceased.  Works at an Engineer, maintenance.  Education: 11th grade.    REVIEW OF SYSTEMS: Constitutional: No fevers, chills, or sweats, no generalized fatigue, change in appetite Eyes: No visual changes, double vision, eye pain Ear, nose and throat: No hearing loss, ear pain, nasal congestion, sore throat Cardiovascular: No chest pain, palpitations Respiratory:  No shortness of breath at rest or with exertion, wheezes GastrointestinaI: No nausea, vomiting, diarrhea, abdominal pain, fecal incontinence Genitourinary:  No dysuria, urinary retention or frequency Musculoskeletal:  No neck pain, back pain Integumentary: No rash, pruritus, skin lesions Neurological: as above Psychiatric: No depression, insomnia,  anxiety Endocrine: No palpitations, fatigue, diaphoresis, mood swings, change in appetite, change in weight, increased thirst Hematologic/Lymphatic:  No anemia, purpura, petechiae. Allergic/Immunologic: no itchy/runny eyes, nasal congestion, recent allergic reactions, rashes  PHYSICAL EXAM: Vitals:   10/20/16 1038  BP: (!) 148/72  Pulse: 79   General: No acute distress Head:  Normocephalic/atraumatic Neck: supple, no paraspinal tenderness, full range of motion Heart:  Regular rate and rhythm Lungs:  Clear to auscultation bilaterally Back: No paraspinal tenderness Skin/Extremities: No rash, no edema Neurological Exam: alert and oriented to person, place, and time. No aphasia or dysarthria. Fund of knowledge is appropriate.  Recent and remote memory are intact.  Attention and concentration are normal.    Able to name objects and repeat phrases. Cranial nerves: Pupils equal, round, reactive to light.  Extraocular movements intact with no nystagmus. Visual fields full. Facial sensation intact. No facial asymmetry. Tongue, uvula, palate midline.  Motor: Bulk and tone normal, muscle strength 5/5 throughout with no pronator drift.  Sensation to light touch, temperature intact.  No extinction to double simultaneous stimulation.  Deep tendon reflexes +1 throughout, toes downgoing.  Finger to nose testing intact.  Gait narrow-based and steady, able to tandem walk adequately.  Romberg negative.  IMPRESSION: This is a pleasant 61 yo RH man with a history of hypertension, hyperlipidemia, tobacco use, who went to the ER for headaches last July 2017 for headache, found to have an abnormal MRI brain with bilateral supra and infratentorial lesions of unclear etiology, findings felt to resemble more a demyelinating or inflammatory process rather than ischemia. Infectious etiology such as viral encephalitis, as well as CNS lymphoma or metastatic disease were also considered. His neurological exam is again normal.  He denies any new symptoms except for left leg pain. We again went over his MRI brain and discussed that the etiology of these lesions is unclear, repeat MRI brain recommended once financial issues allow. He had declined to do a lumbar puncture, and agrees to consider if repeat MRI continues to show similar changes. He will follow up in 6 months and knows to call us for any changes in symptoms.    Thank you for allowing me to participate in his care.  Please do not hesitate to call for any questions or concerns.  The duration of this appointment visit was 15 minutes of face-to-face time with the patient.  Greater than 50% of this time was spent in counseling, explanation of diagnosis, planning of further management, and coordination of care.   Patrcia Dolly, M.D.

## 2016-10-28 ENCOUNTER — Encounter: Payer: Self-pay | Admitting: Vascular Surgery

## 2016-11-05 ENCOUNTER — Encounter (HOSPITAL_COMMUNITY): Payer: PRIVATE HEALTH INSURANCE

## 2016-11-05 ENCOUNTER — Encounter: Payer: PRIVATE HEALTH INSURANCE | Admitting: Vascular Surgery

## 2016-12-25 ENCOUNTER — Encounter: Payer: Self-pay | Admitting: Vascular Surgery

## 2017-01-06 ENCOUNTER — Encounter (HOSPITAL_COMMUNITY): Payer: PRIVATE HEALTH INSURANCE

## 2017-01-06 ENCOUNTER — Encounter: Payer: PRIVATE HEALTH INSURANCE | Admitting: Vascular Surgery

## 2017-02-23 ENCOUNTER — Encounter: Payer: Self-pay | Admitting: Vascular Surgery

## 2017-02-25 ENCOUNTER — Emergency Department (HOSPITAL_COMMUNITY)
Admission: EM | Admit: 2017-02-25 | Discharge: 2017-02-25 | Disposition: A | Discharge status: Home / Self Care | Payer: PRIVATE HEALTH INSURANCE | Attending: Emergency Medicine | Admitting: Emergency Medicine

## 2017-02-25 ENCOUNTER — Encounter (HOSPITAL_COMMUNITY): Payer: Self-pay

## 2017-02-25 ENCOUNTER — Emergency Department (HOSPITAL_COMMUNITY): Payer: PRIVATE HEALTH INSURANCE

## 2017-02-25 DIAGNOSIS — M549 Dorsalgia, unspecified: Secondary | ICD-10-CM | POA: Diagnosis not present

## 2017-02-25 DIAGNOSIS — F17210 Nicotine dependence, cigarettes, uncomplicated: Secondary | ICD-10-CM | POA: Insufficient documentation

## 2017-02-25 LAB — BASIC METABOLIC PANEL
Anion gap: 8 (ref 5–15)
BUN: 7 mg/dL (ref 6–20)
CO2: 24 mmol/L (ref 22–32)
CREATININE: 0.98 mg/dL (ref 0.61–1.24)
Calcium: 8.7 mg/dL — ABNORMAL LOW (ref 8.9–10.3)
Chloride: 105 mmol/L (ref 101–111)
GFR calc non Af Amer: >60 mL/min (ref >60)
Glucose, Bld: 115 mg/dL — ABNORMAL HIGH (ref 65–99)
Potassium: 3.6 mmol/L (ref 3.5–5.1)
SODIUM: 137 mmol/L (ref 135–145)

## 2017-02-25 LAB — CBC
HCT: 45.1 % (ref 39.0–52.0)
Hemoglobin: 15 g/dL (ref 13.0–17.0)
MCH: 29.1 pg (ref 26.0–34.0)
MCHC: 33.3 g/dL (ref 30.0–36.0)
MCV: 87.4 fL (ref 78.0–100.0)
Platelets: 167 10*3/uL (ref 150–400)
RBC: 5.16 MIL/uL (ref 4.22–5.81)
RDW: 13.5 % (ref 11.5–15.5)
WBC: 4.4 10*3/uL (ref 4.0–10.5)

## 2017-02-25 LAB — I-STAT TROPONIN, ED: Troponin i, poc: 0.01 ng/mL (ref 0.00–0.08)

## 2017-02-25 MED ORDER — NAPROXEN 500 MG PO TABS: 500.0000 mg | ORAL_TABLET | Freq: Two times a day (BID) | ORAL | 0 refills | Status: DC

## 2017-02-25 NOTE — ED Triage Notes (Signed)
Pt states that he has been having back pain that radiates to his chest, worse with coughing that has been going on for years, today the pain made him weak and dizzy, denies n/v, some SOB.

## 2017-02-25 NOTE — Discharge Instructions (Signed)
Your back pain seems musculoskeletal. Ice or use heat packs. Take anti-inflammatory medications as needed.  Return for worsening symptoms, including difficulty breathing, passing out, escalating pain or any other symptoms concerning to you.

## 2017-02-25 NOTE — ED Provider Notes (Signed)
[facility]-EMERGENCY DEPT Provider Note   CSN: [id number] Arrival date & time: 02/25/17  4818     History   Chief Complaint Chief Complaint  Patient presents with  . Back Pain  . Chest Pain  . Dizziness    HPI Patrick MCCLAUGHRY is a 61 y.o. male.  The history is provided by the patient.  Back Pain   This is a chronic problem. The current episode started more than 1 week ago. The problem occurs daily. The problem has been gradually worsening. The pain is associated with no known injury. Pain location: left scapula. The quality of the pain is described as stabbing and shooting. Radiates to: left anterior chest. The pain is moderate. Exacerbated by: coughing. The pain is the same all the time. Associated symptoms include chest pain. Pertinent negatives include no fever, no numbness, no abdominal pain, no bowel incontinence, no perianal numbness, no bladder incontinence, no dysuria, no pelvic pain, no paresthesias, no paresis, no tingling and no weakness. He has tried NSAIDs for the symptoms. The treatment provided significant relief.  Chest Pain   Associated symptoms include back pain, cough and dizziness. Pertinent negatives include no abdominal pain, no fever, no numbness, no shortness of breath and no weakness.  Dizziness  Associated symptoms: chest pain   Associated symptoms: no shortness of breath and no weakness    61 year old male who presents with left sided chest and back pain. History of HTN, HLD, and tobacco use. Reports left scapular pain that radiates to the left anterior chest that has been ongoing for several years now. 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 States that the pain often onsets when he is coughing. Resolved after taking ibuprofen. States pain notable when he bends down, and then when he stands up suddenly with brief dizziness. Denies any fevers, chills, difficulty breathing, syncope, leg swelling,  numbness or weakness focally, fall or injury, nausea vomiting, abdominal pain.    Past Medical History:  Diagnosis Date  . Headache 03/2016  . High blood pressure   . High cholesterol     Patient Active Problem List   Diagnosis Date Noted  . Abnormal brain MRI   . Chest pain 03/27/2016  . AKI (acute kidney injury) (HCC) 03/27/2016  . Headache 03/27/2016  . Tobacco abuse 03/27/2016  . Abnormal EKG   . Cephalalgia   . Pain in the chest     Past Surgical History:  Procedure Laterality Date  . NO PAST SURGERIES         Home Medications    Prior to Admission medications   Medication Sig Start Date End Date Taking? Authorizing Provider  atorvastatin (LIPITOR) 20 MG tablet Take 20 mg by mouth daily. 04/24/16   [provider]  cyclobenzaprine (FLEXERIL) 10 MG tablet Take 1 tablet (10 mg total) by mouth 2 (two) times daily as needed for muscle spasms. Patient not taking: Reported on 10/20/2016 06/24/16   Tharon Aquas, PA  gabapentin (NEURONTIN) 600 MG tablet Take 1 tablet (600 mg total) by mouth 3 (three) times daily. Patient not taking: Reported on 07/18/2016 06/24/16   Tharon Aquas, PA  HYDROcodone-acetaminophen (NORCO/VICODIN) 5-325 MG tablet take 1 tablet by mouth every 4 to 6 hours if needed for pain 04/09/16   [provider]  HYDROcodone-acetaminophen (NORCO/VICODIN) 5-325 MG tablet Take 2 tablets by mouth every 4 (four) hours as needed. 06/19/16   Tharon Aquas,  PA  ibuprofen (ADVIL,MOTRIN) 200 MG tablet Take 400 mg by mouth daily as needed (pain).    [provider]  losartan (COZAAR) 25 MG tablet Take 25 mg by mouth daily. 03/10/16   [provider]  penicillin v potassium (VEETID) 500 MG tablet take 1 tablet by mouth four times a day until finished 04/09/16   [provider]    Family History Family History  Problem Relation Age of Onset  . Hypertension Mother   . Hypertension Father     Social History Social  History  Substance Use Topics  . Smoking status: Current Every Day Smoker    Packs/day: 1.00    Types: Cigarettes  . Smokeless tobacco: Former Neurosurgeon  . Alcohol use No     Comment: stopped drinking last week     Allergies   Patient has no known allergies.   Review of Systems Review of Systems  Constitutional: Negative for fever.  HENT: Negative for congestion.   Respiratory: Positive for cough. Negative for shortness of breath.   Cardiovascular: Positive for chest pain.  Gastrointestinal: Negative for abdominal pain and bowel incontinence.  Genitourinary: Negative for bladder incontinence, dysuria and pelvic pain.  Musculoskeletal: Positive for back pain.  Allergic/Immunologic: Negative for immunocompromised state.  Neurological: Positive for dizziness. Negative for tingling, weakness, numbness and paresthesias.  Hematological: Does not bruise/bleed easily.  All other systems reviewed and are negative.    Physical Exam Updated Vital Signs BP (!) 178/85   Pulse 87   Temp 97.3 F (36.3 C)   Resp 20   SpO2 98%   Physical Exam Physical Exam  Nursing note and vitals reviewed. Constitutional: Well developed, well nourished, non-toxic, and in no acute distress Head: Normocephalic and atraumatic.  Mouth/Throat: Oropharynx is clear and moist.  Neck: Normal range of motion. Neck supple.  Cardiovascular: Normal rate and regular rhythm.  +2 symmetric bilateral upper and lower extremity peripheral pulses Pulmonary/Chest: Effort normal and breath sounds normal. No chest wall tenderness  Abdominal: Soft. There is no tenderness. There is no rebound and no guarding.  Musculoskeletal: Normal range of motion.  Neurological: Alert, no facial droop, fluent speech, moves all extremities symmetrically Skin: Skin is warm and dry.  Psychiatric: Cooperative   ED Treatments / Results  Labs (all labs ordered are listed, but only abnormal results are displayed) Labs Reviewed  BASIC  METABOLIC PANEL - Abnormal; Notable for the following:       Result Value   Glucose, Bld 115 (*)    Calcium 8.7 (*)    All other components within normal limits  CBC  I-STAT TROPOININ, ED    EKG  EKG Interpretation  Date/Time:  Wednesday February 25 2017 06:54:14 EDT Ventricular Rate:  77 PR Interval:    QRS Duration: 85 QT Interval:  362 QTC Calculation: 410 R Axis:   62 Text Interpretation:  Sinus rhythm LAE, consider biatrial enlargement Probable left ventricular hypertrophy Borderline T abnormalities, inferior leads Confirmed by Ross Marcus ([id number]) on 02/25/2017 6:57:34 AM       Radiology Dg Chest 2 View  Result Date: 02/25/2017 CLINICAL DATA:  Left scapular pain radiating to the left anterior chest. Episodes have been intermittent over the past several years. Coughing can bring on the episodes. EXAM: CHEST  2 VIEW COMPARISON:  Chest x-ray of March 27, 2016 FINDINGS: The lungs are adequately inflated. The interstitial markings are mildly prominent but stable. There is no alveolar infiltrate or pleural effusion. The heart and pulmonary vascularity  are normal. The mediastinum is normal in width. There is no pleural effusion. The bony thorax exhibits no acute abnormality. IMPRESSION: No acute cardiopulmonary abnormality. Mild interstitial prominence likely reflects the patient's smoking history. Electronically Signed   By: David  [place] M.D.   On: 02/25/2017 07:32    Procedures Procedures (including critical care time)  Medications Ordered in ED Medications - No data to display   Initial Impression / Assessment and Plan / ED Course  I have reviewed the triage vital signs and the nursing notes.  Pertinent labs & imaging results that were available during my care of the patient were reviewed by me and considered in my medical decision making (see chart for details).     Presenting with intermittent left scapular pain that has been ongoing for several years now. Seems very  musculoskeletal in nature as only occurring when he coughs. Currently has been pain-free since taking ibuprofen. Well-appearing in no acute distress with non-concerning vitals. History  and presentation is not concerning for serious intrathoracic or cardiopulmonary etiologies of his symptoms, such as ACS, dissection, or PE. EKG shows no acute ischemic changes, and unchanged from prior. A troponin is normal. Chest x-ray visualized and shows no acute cardiopulmonary processes. Patient is felt safe for discharge with continued supportive management for likely musculoskeletal pain. Strict return and follow-up instructions reviewed. He expressed understanding of all discharge instructions and felt comfortable with the plan of care.   Final Clinical Impressions(s) / ED Diagnoses   Final diagnoses:  Musculoskeletal back pain    New Prescriptions New Prescriptions   No medications on file     Lavera Guise, MD 02/25/17 [phone]

## 2017-03-03 ENCOUNTER — Encounter (HOSPITAL_COMMUNITY): Payer: PRIVATE HEALTH INSURANCE

## 2017-03-03 ENCOUNTER — Encounter: Payer: PRIVATE HEALTH INSURANCE | Admitting: Vascular Surgery

## 2017-03-09 ENCOUNTER — Encounter (HOSPITAL_COMMUNITY): Payer: Self-pay | Admitting: *Deleted

## 2017-03-09 ENCOUNTER — Emergency Department (HOSPITAL_COMMUNITY)
Admission: EM | Admit: 2017-03-09 | Discharge: 2017-03-09 | Disposition: A | Discharge status: Home / Self Care | Payer: PRIVATE HEALTH INSURANCE | Attending: Emergency Medicine | Admitting: Emergency Medicine

## 2017-03-09 DIAGNOSIS — K0889 Other specified disorders of teeth and supporting structures: Secondary | ICD-10-CM | POA: Insufficient documentation

## 2017-03-09 DIAGNOSIS — F17210 Nicotine dependence, cigarettes, uncomplicated: Secondary | ICD-10-CM | POA: Diagnosis not present

## 2017-03-09 MED ORDER — PENICILLIN V POTASSIUM 250 MG PO TABS
500.0000 mg | ORAL_TABLET | Freq: Once | ORAL | Status: AC
  Administered 2017-03-09: 500 mg via ORAL
  Filled 2017-03-09: qty 2

## 2017-03-09 MED ORDER — PENICILLIN V POTASSIUM 500 MG PO TABS: 500.0000 mg | ORAL_TABLET | Freq: Three times a day (TID) | ORAL | 0 refills | Status: DC

## 2017-03-09 MED ORDER — HYDROCODONE-ACETAMINOPHEN 5-325 MG PO TABS: ORAL_TABLET | ORAL | 0 refills | Status: DC

## 2017-03-09 MED ORDER — HYDROCODONE-ACETAMINOPHEN 5-325 MG PO TABS
1.0000 | ORAL_TABLET | Freq: Once | ORAL | Status: AC
  Administered 2017-03-09: 1 via ORAL
  Filled 2017-03-09: qty 1

## 2017-03-09 NOTE — ED Notes (Signed)
See EDP secondary assessment.  

## 2017-03-09 NOTE — ED Triage Notes (Signed)
C/o toothache onset Fri. Left lower jaw swollen

## 2017-03-09 NOTE — Discharge Instructions (Signed)
Please read and follow all provided instructions.  Your diagnoses today include:  1. Toothache     The exam and treatment you received today has been provided on an emergency basis only. This is not a substitute for complete medical or dental care.  Tests performed today include:  Vital signs. See below for your results today.   Medications prescribed:   Vicodin (hydrocodone/acetaminophen) - narcotic pain medication  DO NOT drive or perform any activities that require you to be awake and alert because this medicine can make you drowsy. BE VERY CAREFUL not to take multiple medicines containing Tylenol (also called acetaminophen). Doing so can lead to an overdose which can damage your liver and cause liver failure and possibly death.   Penicillin - antibiotic  You have been prescribed an antibiotic medicine: take the entire course of medicine even if you are feeling better. Stopping early can cause the antibiotic not to work.  Take any prescribed medications only as directed.  Home care instructions:  Follow any educational materials contained in this packet.  Follow-up instructions: Please follow-up with your dentist for further evaluation of your symptoms.   Dental Assistance: See below for dental referrals  Return instructions:   Please return to the Emergency Department if you experience worsening symptoms.  Please return if you develop a fever, you develop more swelling in your face or neck, you have trouble breathing or swallowing food.  Please return if you have any other emergent concerns.  Additional Information:  Your vital signs today were: BP (!) 171/108 (BP Location: Right Arm)    Pulse 74    Temp 98.2 F (36.8 C) (Oral)    Resp 16    Ht 5\' 6"  (1.676 m)    Wt 86.2 kg (190 lb)    SpO2 100%    BMI 30.67 kg/m  If your blood pressure (BP) was elevated above 135/85 this visit, please have this repeated by your doctor within one month. --------------

## 2017-03-09 NOTE — ED Provider Notes (Signed)
[facility]-EMERGENCY DEPT Provider Note   CSN: [id number] Arrival date & time: 03/09/17  4098     History   Chief Complaint Chief Complaint  Patient presents with  . Dental Pain    HPI Patrick Gilbert is a 61 y.o. male.  Patient presents with complaint of dental pain stating that his teeth hurt all over. Dental pain is chronic however he has been having worsening pain and subjective swelling to the left upper incisor today. No neck swelling or trouble swallowing. Has been been able to eat or take blood pressure medications 2/2 pain. The onset of this condition was acute. The course is worsening. Aggravating factors: none. Alleviating factors: none.        Past Medical History:  Diagnosis Date  . Headache 03/2016  . High blood pressure   . High cholesterol     Patient Active Problem List   Diagnosis Date Noted  . Abnormal brain MRI   . Chest pain 03/27/2016  . AKI (acute kidney injury) (HCC) 03/27/2016  . Headache 03/27/2016  . Tobacco abuse 03/27/2016  . Abnormal EKG   . Cephalalgia   . Pain in the chest     Past Surgical History:  Procedure Laterality Date  . NO PAST SURGERIES         Home Medications    Prior to Admission medications   Medication Sig Start Date End Date Taking? Authorizing Provider  atorvastatin (LIPITOR) 20 MG tablet Take 20 mg by mouth daily. 04/24/16   [provider]  HYDROcodone-acetaminophen (NORCO/VICODIN) 5-325 MG tablet Take 1-2 tablets every 6 hours as needed for severe pain 03/09/17   Renne Crigler, PA-C  ibuprofen (ADVIL,MOTRIN) 200 MG tablet Take 400 mg by mouth daily as needed (pain).    [provider]  losartan (COZAAR) 25 MG tablet Take 25 mg by mouth daily. 03/10/16   [provider]  naproxen (NAPROSYN) 500 MG tablet Take 1 tablet (500 mg total) by mouth 2 (two) times daily. 02/25/17   Lavera Guise, MD  penicillin v potassium (VEETID) 500 MG tablet Take 1 tablet (500 mg total) by mouth 3 (three) times  daily. 03/09/17   Renne Crigler, PA-C    Family History Family History  Problem Relation Age of Onset  . Hypertension Mother   . Hypertension Father     Social History Social History  Substance Use Topics  . Smoking status: Current Every Day Smoker    Packs/day: 1.00    Types: Cigarettes  . Smokeless tobacco: Former Neurosurgeon  . Alcohol use No     Comment: stopped drinking last week     Allergies   Patient has no known allergies.   Review of Systems Review of Systems  Constitutional: Negative for fever.  HENT: Positive for dental problem and facial swelling. Negative for ear pain, sore throat and trouble swallowing.   Respiratory: Negative for shortness of breath and stridor.   Musculoskeletal: Negative for neck pain.  Skin: Negative for color change.  Neurological: Negative for headaches.     Physical Exam Updated Vital Signs BP (!) 171/108 (BP Location: Right Arm)   Pulse 74   Temp 98.2 F (36.8 C) (Oral)   Resp 16   Ht 5\' 6"  (1.676 m)   Wt 86.2 kg (190 lb)   SpO2 100%   BMI 30.67 kg/m   Physical Exam  Constitutional: He appears well-developed and well-nourished.  HENT:  Head: Normocephalic and atraumatic.  Right Ear: Tympanic membrane, external ear and  ear canal normal.  Left Ear: Tympanic membrane, external ear and ear canal normal.  Nose: Nose normal.  Mouth/Throat: Uvula is midline, oropharynx is clear and moist and mucous membranes are normal. No trismus in the jaw. Abnormal dentition. Dental caries present. No dental abscesses or uvula swelling. No tonsillar abscesses.  Very poor dentition with advanced periodontal disease. Majority of teeth are missing. Patient has a large cavity in a left upper incisor, with localized tenderness of the gum. No gross abscess.  Eyes: Pupils are equal, round, and reactive to light.  Neck: Normal range of motion. Neck supple.  No neck swelling or Lugwig's angina  Neurological: He is alert.  Skin: Skin is warm and dry.    Psychiatric: He has a normal mood and affect.  Nursing note and vitals reviewed.    ED Treatments / Results  Labs (all labs ordered are listed, but only abnormal results are displayed) Labs Reviewed - No data to display  EKG  EKG Interpretation None       Radiology No results found.  Procedures Procedures (including critical care time)  Medications Ordered in ED Medications  penicillin v potassium (VEETID) tablet 500 mg (not administered)  HYDROcodone-acetaminophen (NORCO/VICODIN) 5-325 MG per tablet 1 tablet (not administered)     Initial Impression / Assessment and Plan / ED Course  I have reviewed the triage vital signs and the nursing notes.  Pertinent labs & imaging results that were available during my care of the patient were reviewed by me and considered in my medical decision making (see chart for details).     7:54 AM Patient seen and examined. Medications ordered.   Vital signs reviewed and are as follows: BP (!) 171/108 (BP Location: Right Arm)   Pulse 74   Temp 98.2 F (36.8 C) (Oral)   Resp 16   Ht 5\' 6"  (1.676 m)   Wt 86.2 kg (190 lb)   SpO2 100%   BMI 30.67 kg/m   Patient counseled on use of narcotic pain medications. Counseled not to combine these medications with others containing tylenol. Urged not to drink alcohol, drive, or perform any other activities that requires focus while taking these medications. The patient verbalizes understanding and agrees with the plan.  Patient counseled to take prescribed medications as directed, return with worsening facial or neck swelling, and to follow-up with their dentist as soon as possible.    Final Clinical Impressions(s) / ED Diagnoses   Final diagnoses:  Toothache   Patient with toothache. No fever. Exam unconcerning for Ludwig's angina or other deep tissue infection in neck.   As there is gum swelling, erythema, and facial swelling, will treat with antibiotic and pain medicine. Urged  patient to follow-up with dentist.    New Prescriptions New Prescriptions   HYDROCODONE-ACETAMINOPHEN (NORCO/VICODIN) 5-325 MG TABLET    Take 1-2 tablets every 6 hours as needed for severe pain   PENICILLIN V POTASSIUM (VEETID) 500 MG TABLET    Take 1 tablet (500 mg total) by mouth 3 (three) times daily.     Renne Crigler, PA-C 03/09/17 [id number]    Shon Baton, MD 03/09/17 [phone]

## 2017-04-21 ENCOUNTER — Ambulatory Visit: Payer: PRIVATE HEALTH INSURANCE | Admitting: Neurology

## 2017-04-21 DIAGNOSIS — Z029 Encounter for administrative examinations, unspecified: Secondary | ICD-10-CM

## 2017-04-27 ENCOUNTER — Encounter: Payer: Self-pay | Admitting: Neurology

## 2017-05-10 ENCOUNTER — Encounter (HOSPITAL_COMMUNITY): Payer: Self-pay | Admitting: Emergency Medicine

## 2017-05-10 ENCOUNTER — Emergency Department (HOSPITAL_COMMUNITY)
Admission: EM | Admit: 2017-05-10 | Discharge: 2017-05-10 | Disposition: A | Discharge status: Home / Self Care | Payer: PRIVATE HEALTH INSURANCE | Attending: Emergency Medicine | Admitting: Emergency Medicine

## 2017-05-10 ENCOUNTER — Emergency Department (HOSPITAL_COMMUNITY): Payer: PRIVATE HEALTH INSURANCE

## 2017-05-10 DIAGNOSIS — I10 Essential (primary) hypertension: Secondary | ICD-10-CM | POA: Diagnosis not present

## 2017-05-10 DIAGNOSIS — M25611 Stiffness of right shoulder, not elsewhere classified: Secondary | ICD-10-CM | POA: Insufficient documentation

## 2017-05-10 DIAGNOSIS — F17210 Nicotine dependence, cigarettes, uncomplicated: Secondary | ICD-10-CM | POA: Insufficient documentation

## 2017-05-10 DIAGNOSIS — Z79899 Other long term (current) drug therapy: Secondary | ICD-10-CM | POA: Insufficient documentation

## 2017-05-10 DIAGNOSIS — M25512 Pain in left shoulder: Secondary | ICD-10-CM

## 2017-05-10 MED ORDER — PREDNISONE 20 MG PO TABS: 40.0000 mg | ORAL_TABLET | Freq: Every day | ORAL | 0 refills | Status: DC

## 2017-05-10 MED ORDER — PREDNISONE 20 MG PO TABS
60.0000 mg | ORAL_TABLET | ORAL | Status: AC
  Administered 2017-05-10: 60 mg via ORAL
  Filled 2017-05-10: qty 3

## 2017-05-10 NOTE — Discharge Instructions (Signed)
As discussed, your evaluation today has been largely reassuring.  But, it is important that you monitor your condition carefully, and do not hesitate to return to the ED if you develop new, or concerning changes in your condition. ? ?Otherwise, please follow-up with your physician for appropriate ongoing care. ? ?
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
## 2017-05-10 NOTE — ED Triage Notes (Signed)
Pt reports two weeks of left shoulder pain, hx of same. Pt sates the pain is radiating into his left neck, painful with movement especially bending over. Pt sates he was seen two weeks ago for this and given a pain pill, has been taking ibuprofen at home with no relief.

## 2017-08-04 ENCOUNTER — Encounter (HOSPITAL_COMMUNITY): Payer: Self-pay

## 2017-08-04 ENCOUNTER — Emergency Department (HOSPITAL_COMMUNITY)
Admission: EM | Admit: 2017-08-04 | Discharge: 2017-08-04 | Disposition: A | Discharge status: Home / Self Care | Payer: PRIVATE HEALTH INSURANCE | Attending: Emergency Medicine | Admitting: Emergency Medicine

## 2017-08-04 ENCOUNTER — Emergency Department (HOSPITAL_COMMUNITY): Payer: PRIVATE HEALTH INSURANCE

## 2017-08-04 DIAGNOSIS — F1210 Cannabis abuse, uncomplicated: Secondary | ICD-10-CM | POA: Insufficient documentation

## 2017-08-04 DIAGNOSIS — M25512 Pain in left shoulder: Secondary | ICD-10-CM | POA: Insufficient documentation

## 2017-08-04 DIAGNOSIS — F17210 Nicotine dependence, cigarettes, uncomplicated: Secondary | ICD-10-CM | POA: Diagnosis not present

## 2017-08-04 MED ORDER — PREDNISONE 20 MG PO TABS: 40.0000 mg | ORAL_TABLET | Freq: Every day | ORAL | 0 refills | Status: DC

## 2017-08-04 MED ORDER — METHOCARBAMOL 500 MG PO TABS: 500.0000 mg | ORAL_TABLET | Freq: Two times a day (BID) | ORAL | 0 refills | Status: DC

## 2017-08-04 MED ORDER — IBUPROFEN 800 MG PO TABS
800.0000 mg | ORAL_TABLET | Freq: Once | ORAL | Status: AC
Start: 2017-08-04 — End: 2017-08-04
  Administered 2017-08-04: 800 mg via ORAL
  Filled 2017-08-04: qty 1

## 2017-08-04 MED ORDER — IBUPROFEN 800 MG PO TABS: 800.0000 mg | ORAL_TABLET | Freq: Three times a day (TID) | ORAL | 0 refills | Status: DC

## 2017-08-04 NOTE — ED Notes (Signed)
Pt verbalized understanding discharge instructions and denies any further needs or questions at this time. VS stable, ambulatory and steady gait.   

## 2017-08-04 NOTE — ED Provider Notes (Signed)
[facility] EMERGENCY DEPARTMENT Provider Note   CSN: [id number] Arrival date & time: 08/04/17  2012     History   Chief Complaint Chief Complaint  Patient presents with  . Shoulder Pain    HPI Patrick Gilbert is a 61 y.o. male.  HPI   61 year old male presenting for evaluation of left shoulder pain.  Patient reports for the past 4 days he has been having pain to his left shoulder.  He described pain as a achy throbbing sensation starting from his neck radiates to his left and right shoulder and sometimes down to both arms.  Pain is worsening with any movement and sometimes with positional change.  He has been trying to use ice and heat and ibuprofen without adequate relief.  Pain is moderate in severity.  Pain tends to worse throughout the day.  Sometimes at night pain intensify causing him to having trouble sleeping.  He denies any associated fever, chills, chest pain, shortness of breath, lightheadedness, dizziness, diaphoresis, nausea, focal weakness, or rash.  He has had similar shoulder pain like this in the past.  He admits to perform heavy lifting.  Report prior left shoulder injury.   Past Medical History:  Diagnosis Date  . Headache 03/2016  . High blood pressure   . High cholesterol     Patient Active Problem List   Diagnosis Date Noted  . Abnormal brain MRI   . Chest pain 03/27/2016  . AKI (acute kidney injury) (HCC) 03/27/2016  . Headache 03/27/2016  . Tobacco abuse 03/27/2016  . Abnormal EKG   . Cephalalgia   . Pain in the chest     Past Surgical History:  Procedure Laterality Date  . NO PAST SURGERIES         Home Medications    Prior to Admission medications   Medication Sig Start Date End Date Taking? Authorizing Provider  atorvastatin (LIPITOR) 20 MG tablet Take 20 mg by mouth daily. 04/24/16   [provider]  HYDROcodone-acetaminophen (NORCO/VICODIN) 5-325 MG tablet Take 1-2 tablets every 6 hours as needed for severe  pain 03/09/17   Renne Crigler, PA-C  ibuprofen (ADVIL,MOTRIN) 200 MG tablet Take 400 mg by mouth daily as needed (pain).    [provider]  losartan (COZAAR) 25 MG tablet Take 25 mg by mouth daily. 03/10/16   [provider]  naproxen (NAPROSYN) 500 MG tablet Take 1 tablet (500 mg total) by mouth 2 (two) times daily. 02/25/17   Lavera Guise, MD  penicillin v potassium (VEETID) 500 MG tablet Take 1 tablet (500 mg total) by mouth 3 (three) times daily. 03/09/17   Renne Crigler, PA-C  predniSONE (DELTASONE) 20 MG tablet Take 2 tablets (40 mg total) by mouth daily with breakfast. For the next four days 05/10/17   Gerhard Munch, MD    Family History Family History  Problem Relation Age of Onset  . Hypertension Mother   . Hypertension Father     Social History Social History   Tobacco Use  . Smoking status: Current Every Day Smoker    Packs/day: 1.00    Types: Cigarettes  . Smokeless tobacco: Former Engineer, water Use Topics  . Alcohol use: No    Comment: stopped drinking last week  . Drug use: Yes    Frequency: 1.0 times per week    Types: Marijuana     Allergies   Patient has no known allergies.   Review of Systems Review of Systems  All other systems reviewed and are negative.    Physical Exam Updated Vital Signs BP (!) 161/86 (BP Location: Right Arm)   Pulse 83   Temp 98 F (36.7 C) (Oral)   Resp 18   SpO2 100%   Physical Exam  Constitutional: He appears well-developed and well-nourished. No distress.  HENT:  Head: Normocephalic and atraumatic.  Eyes: Conjunctivae are normal.  Neck: Normal range of motion. Neck supple.  Tenderness to bilateral paracervical spinal muscle and palpation without significant midline spine tenderness.  Cardiovascular: Normal rate, regular rhythm and intact distal pulses.  Pulmonary/Chest: Effort normal and breath sounds normal.  Musculoskeletal: He exhibits tenderness (Diffuse tenderness to bilateral shoulder  on palpation however full range of motion with normal grip strength and normal shoulder shrug.  No evidence of deformity, no signs of infection.  Grip strength normal).  Neurological: He is alert.  Skin: No rash noted.  Psychiatric: He has a normal mood and affect.  Nursing note and vitals reviewed.    ED Treatments / Results  Labs (all labs ordered are listed, but only abnormal results are displayed) Labs Reviewed - No data to display  EKG  EKG Interpretation None       Radiology Dg Shoulder Left  Result Date: 08/04/2017 CLINICAL DATA:  61 year old male with all left shoulder pain. EXAM: LEFT SHOULDER - 2+ VIEW COMPARISON:  Left shoulder radiograph dated 05/10/2017 FINDINGS: There is no acute fracture or dislocation. No significant arthritic changes. The soft tissues appear unremarkable. IMPRESSION: Negative. Electronically Signed   By: Elgie CollardArash  Radparvar M.D.   On: 08/04/2017 21:20    Procedures Procedures (including critical care time)  Medications Ordered in ED Medications  ibuprofen (ADVIL,MOTRIN) tablet 800 mg (not administered)     Initial Impression / Assessment and Plan / ED Course  I have reviewed the triage vital signs and the nursing notes.  Pertinent labs & imaging results that were available during my care of the patient were reviewed by me and considered in my medical decision making (see chart for details).     BP (!) 161/86 (BP Location: Right Arm)   Pulse 83   Temp 98 F (36.7 C) (Oral)   Resp 18   SpO2 100%    Final Clinical Impressions(s) / ED Diagnoses   Final diagnoses:  Left shoulder pain, unspecified chronicity    ED Discharge Orders        Ordered    predniSONE (DELTASONE) 20 MG tablet  Daily with breakfast     08/04/17 2250    ibuprofen (ADVIL,MOTRIN) 800 MG tablet  3 times daily     08/04/17 2250    methocarbamol (ROBAXIN) 500 MG tablet  2 times daily     08/04/17 2250     10:54 PM Patient here with neck and shoulder pain  likely musculoskeletal in origin less likely to be ACS.  He is well-appearing.  He has been seen and evaluated for his symptoms in the past.  This x-ray of his left shoulder shows no acute pathology.  Recommend follow-up with orthopedic for the care.  Will provide a course of steroid, muscle relaxant, and anti-inflammatory medication as patient's pain is radicular in nature.   Fayrene Helperran, Hurschel Paynter, PA-C 08/04/17 2255    Nira Connardama, Pedro Eduardo, MD 08/05/17 [phone]

## 2017-08-04 NOTE — Discharge Instructions (Signed)
Please follow up with an orthopedist for further evaluation of your shoulder pain.

## 2017-08-04 NOTE — ED Notes (Signed)
Pt upset he has not been discharged yet. Pt states " I hate this hospital, every time I come here y'all never do anything. I want pain medication.  I am going to walk out if I do not get what I want".  Pt was informed PA is with other Pt's and will be with him as soon as he can.
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
## 2017-08-04 NOTE — ED Triage Notes (Signed)
Pt adds that he also has pain in his L leg making him walk with a limp and the pain shoots to his R arm. Neuro intact bilaterally

## 2017-08-15 IMAGING — CT CT HEAD CODE STROKE
3 series · 14 of 47 positions shown, 16 images · non-contrast
Comparison: 03/27/2016 CT and MRI.

CLINICAL DATA: Code stroke.  Left-sided numbness and tingling.

EXAM:
CT HEAD WITHOUT CONTRAST
TECHNIQUE: Contiguous axial images were obtained from the base of the skull
through the vertex without intravenous contrast.

[Series 2: head 5.0 st · axial · 0.46mm/px · z∈[-124,+26]mm · 8 of 36 slices shown, 10 images]
[im 3/36  brain]
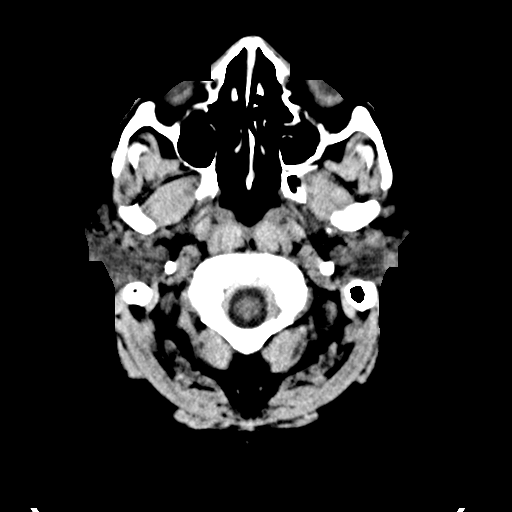
[im 3/36  bone]
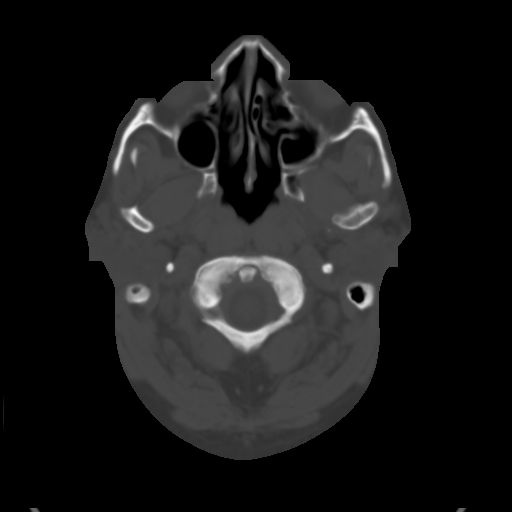
[im 8/36  brain]
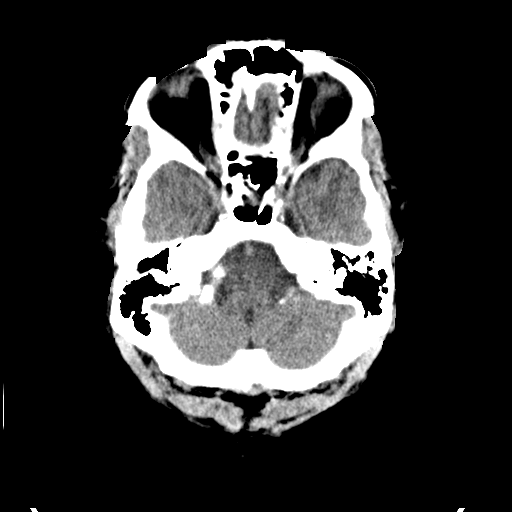
[im 11/36  brain]
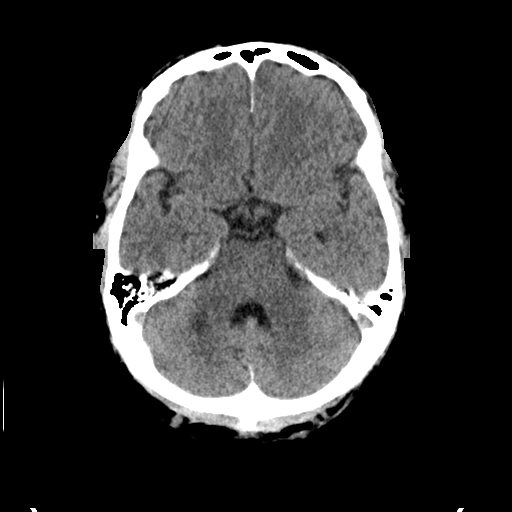
[im 16/36  brain]
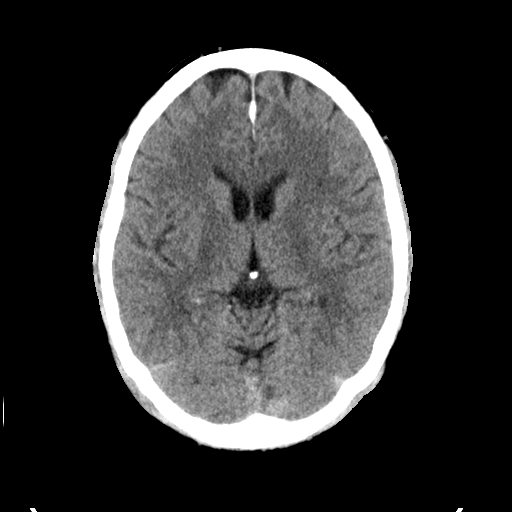
[im 20/36  brain]
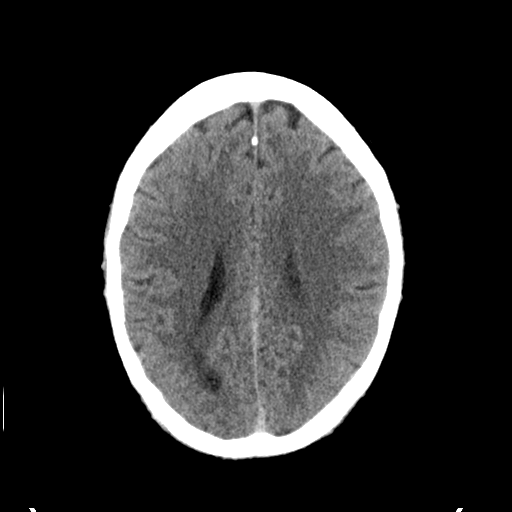
[im 20/36  bone]
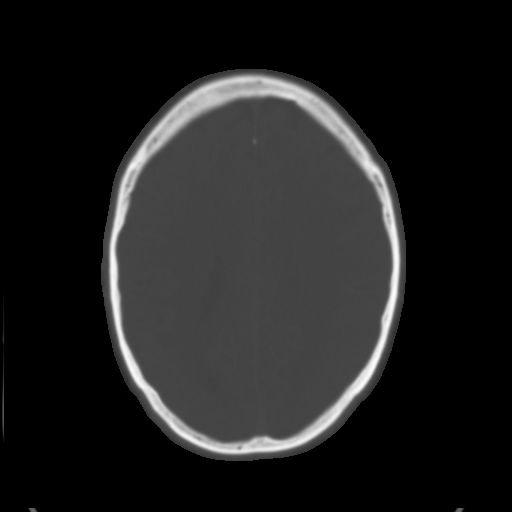
[im 25/36  brain]
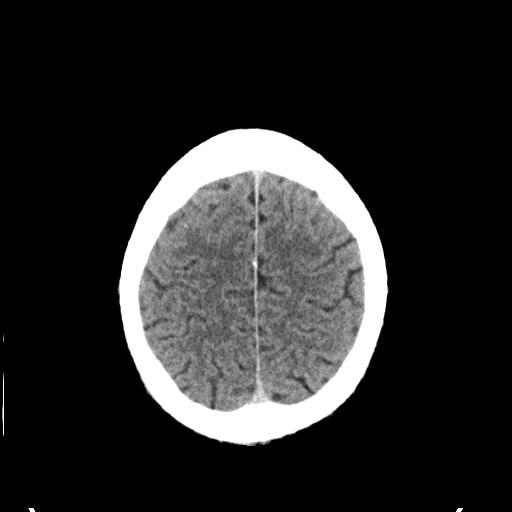
[im 28/36  brain]
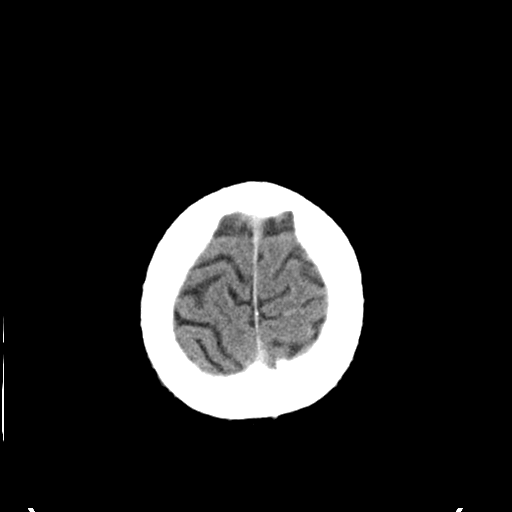
[im 33/36  brain]
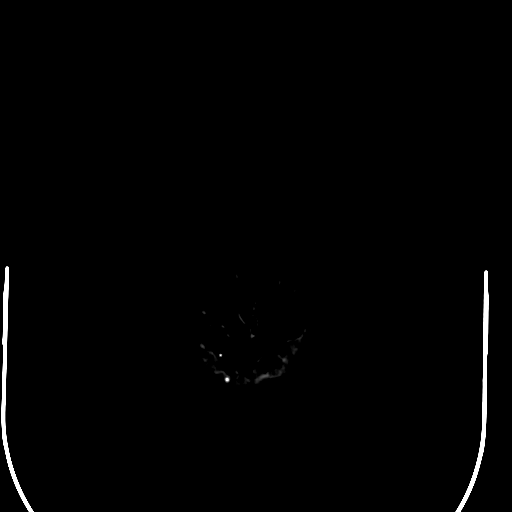

[Series 4: head 3.0 cor st · coronal · 0.32mm/px · 3 of 72 slices shown]
[im 24/72  brain]
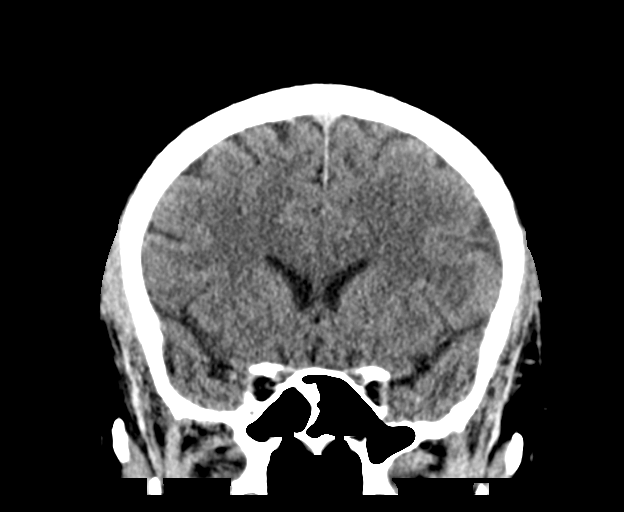
[im 32/72  brain]
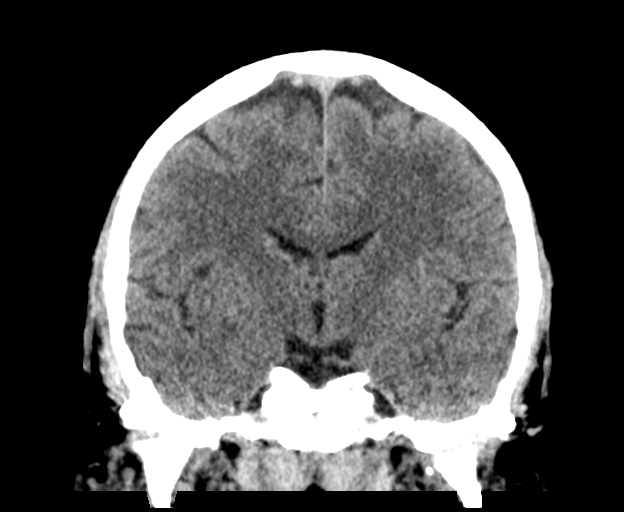
[im 40/72  brain]
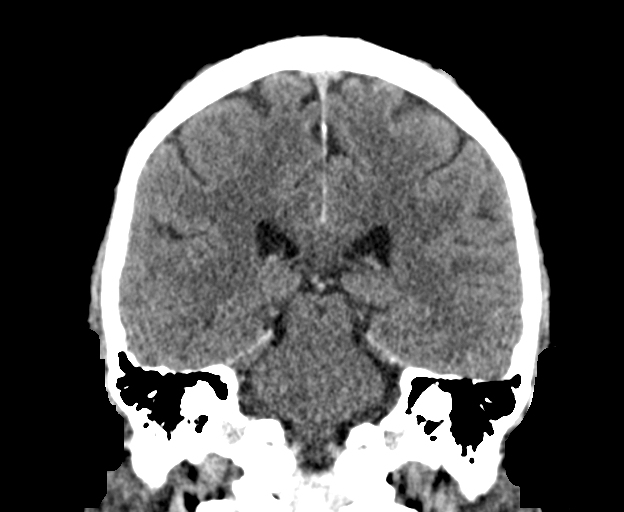

[Series 5: head 3.0 sag st · sagittal · 0.39mm/px · 3 of 57 slices shown]
[im 19/57  brain]
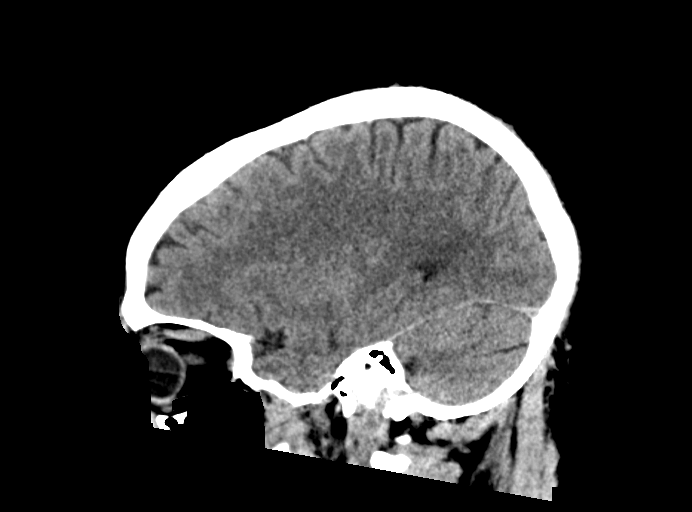
[im 29/57  brain]
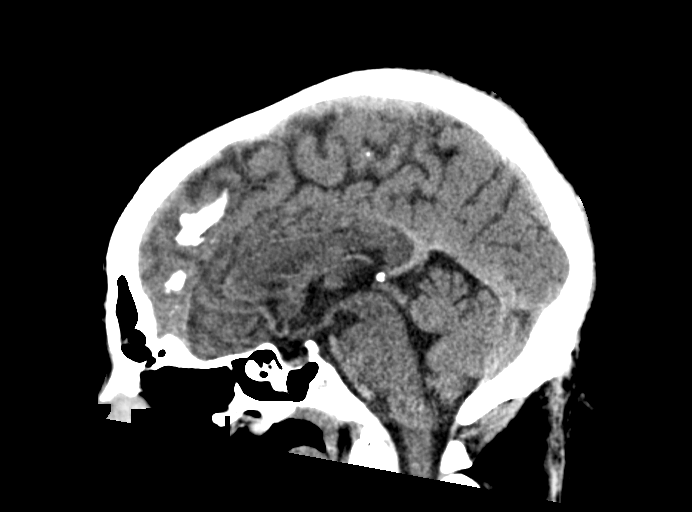
[im 38/57  brain]
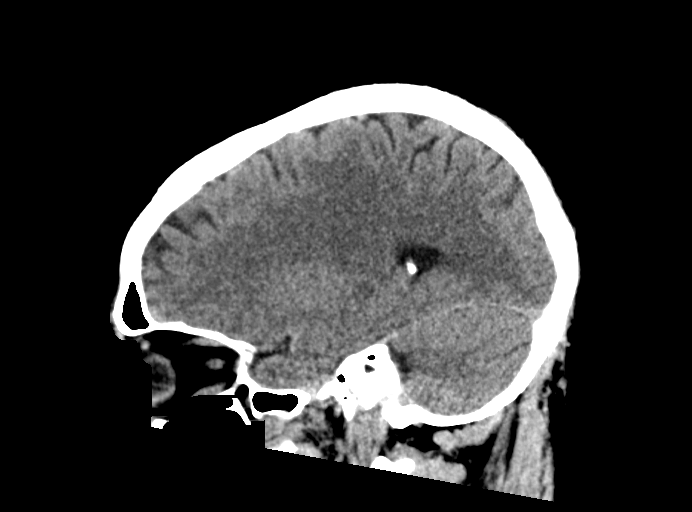

[14 of 47 positions shown; findings below may reference images not displayed]

FINDINGS: Brain: Low-density is again seen right middle cerebellar peduncle
region. No other posterior fossa abnormality. Low-density is seen in
the deep white matter adjacent to the occipital horn of the right
lateral ventricle as previously demonstrated. The remainder of the
exam is normal by CT.

Vascular: No hyperdense vessels. Minimal atherosclerotic
calcification of the major vessels at the base of the brain.

Skull: Normal

Sinuses/Orbits: Mild sinus mucosal thickening. No advanced
inflammatory disease. Orbits negative.

Other: None significant

ASPECTS (Alberta Stroke Program Early CT Score)

- Ganglionic level infarction (caudate, lentiform nuclei, internal
capsule, insula, M1-M3 cortex): 7

- Supraganglionic infarction (M4-M6 cortex): 3

Total score (0-10 with 10 being normal): 10
IMPRESSION: 1. No sign of acute infarction by CT. Abnormal low density in the
right middle cerebellar peduncle and in the white matter adjacent to
the occipital horn of right lateral ventricle as seen previously.
MRI showed a multifocal white matter process felt to be
demyelinating disease or postinflammatory change.
2. ASPECTS is 10.

I am in the process of calling this report presently.

## 2018-07-27 ENCOUNTER — Ambulatory Visit: Payer: PRIVATE HEALTH INSURANCE | Admitting: Internal Medicine

## 2018-07-27 ENCOUNTER — Encounter: Payer: Self-pay | Admitting: Internal Medicine

## 2018-07-27 VITALS — BP 160/100 | HR 80 | Resp 12 | Ht 67.0 in | Wt 201.0 lb

## 2018-07-27 DIAGNOSIS — R202 Paresthesia of skin: Secondary | ICD-10-CM

## 2018-07-27 DIAGNOSIS — M79605 Pain in left leg: Secondary | ICD-10-CM

## 2018-07-27 DIAGNOSIS — R9089 Other abnormal findings on diagnostic imaging of central nervous system: Secondary | ICD-10-CM

## 2018-07-27 DIAGNOSIS — R200 Anesthesia of skin: Secondary | ICD-10-CM | POA: Insufficient documentation

## 2018-07-27 DIAGNOSIS — K08139 Complete loss of teeth due to caries, unspecified class: Secondary | ICD-10-CM

## 2018-07-27 DIAGNOSIS — E785 Hyperlipidemia, unspecified: Secondary | ICD-10-CM

## 2018-07-27 DIAGNOSIS — I10 Essential (primary) hypertension: Secondary | ICD-10-CM

## 2018-07-27 MED ORDER — ROSUVASTATIN CALCIUM 20 MG PO TABS: ORAL_TABLET | ORAL | 6 refills | Status: AC

## 2018-07-27 MED ORDER — GABAPENTIN 100 MG PO CAPS: 100.0000 mg | ORAL_CAPSULE | Freq: Three times a day (TID) | ORAL | 11 refills | Status: DC

## 2018-07-27 MED ORDER — LOSARTAN POTASSIUM-HCTZ 50-12.5 MG PO TABS: 1.0000 | ORAL_TABLET | Freq: Every day | ORAL | 11 refills | Status: DC

## 2018-07-27 NOTE — Progress Notes (Signed)
Subjective:    Patient ID: Patrick Gilbert, male    DOB: 1956/02/11, 62 y.o.   MRN: [id number]  HPI  Here to establish.  1.  Hypertension:  Diagnosed about 1.5 years ago.  Takes Losartan 25 mg once daily.  States he does not miss, but sounds like he is late in refilling his meds every month, missing at least a couple of days.  Also, was out for an unknown period of time until about 1 week ago, when he obtained 10 days worth of pills.  2.  Hyperlipidemia:  Diagnosed also about 1.5 years ago.  He does not eat much in way of vegetables daily.  Eats fruit once daily.  Taking Rosuvastatin 5 mg daily.  Did not tolerate Lipitor which caused leg pain.    3.  Dental decay and loss:  Having difficulties eating as only has 4 teeth left.    4.  Left leg pain:  Many years ago, pulled a groin muscle.  Since then, his anterior thigh hurts.  Always hurts.  Aches like a deep toothache.  When he sits for a while, his leg gets very stiff and hard to get up and walk.   No current pain in the groin.   Later, states the pain actually comes from his low back/posterior pelvic area and radiates into thigh.  No history of back pain. 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 Has had problem with left leg since. In an old ED chart from 02/2015 documentation of  X-Ray negative for obvious fracture or dislocation. Films show mild degenerative changes and evidence of calcific tendinitis of the iliopsoas muscle on the left, which was felt to be the likely cause of pain. Sounds like he has had difficulties with any type of follow up and so never had this adequately evaluated and treated  5.  Left hand and arm:  He is right handed.   Left hand with numbness.  Difficult history, but states his hand and forearm feels numb and tingling all the time. Does not wake  him from sleep.   He does not use the left hand much due to the numbness.  He is a Copy. Later admits to being cut deeply on his left shoulder before the numbness,but sounds like that was many years before the numbness started. He relates all his complaints on his left side to when he fell jumping between trucks as noted above. He cannot remember if he was reaching with his left arm at the time and wrenched the shoulder, but feels this is when his arm numbness started as well.  Old records document the possibility of carpal tunnel syndrome of his left hand several years ago  6.  History of abnormal MRI suggesting demyelinating disease.  This was performed in 2017 when he presented to ED with headache and abnormalities found on CT scan. IMPRESSION: 1. Abnormal signal in the bilateral periventricular and to a lesser extent subcortical hemispheric white matter, as well as in right cerebellar white matter. These findings more resemble a Demyelinating or Inflammatory process rather than ischemia. Infectious etiology such as viral encephalitis should be considered  Current Meds  Medication Sig  . ibuprofen (ADVIL,MOTRIN) 200 MG tablet Take 200 mg by mouth every 6 (six) hours as needed.  Marland Kitchen losartan (COZAAR) 25  MG tablet Take 25 mg by mouth daily.  . [DISCONTINUED] atorvastatin (LIPITOR) 20 MG tablet Take 20 mg by mouth daily.   Allergies  Allergen Reactions  . Lipitor [Atorvastatin Calcium] Other (See Comments)    Muscle pain, which he does not have with Rosuvastatin.   Past Medical History:  Diagnosis Date  . Headache 03/2016  . High blood pressure   . High cholesterol    Past Surgical History:  Procedure Laterality Date  . Repair of left shoulder laceration     cut with broken glass bottle.  Missed a major artery by just a small measurement per patient   Family History  Problem Relation Age of Onset  . Hypertension Mother   . Heart disease Mother        unknown open heart surgery   . Hypertension Father   . Peripheral vascular disease Father   . Heart disease Brother        Unknown heart issue  . Seizures Daughter    Social History   Socioeconomic History  . Marital status: Legally Separated    Spouse name: Not on file  . Number of children: 2  . Years of education: 96  . Highest education level: 11th grade  Occupational History  . Occupation: Estate manager/land agent job  Engineer, production  . Financial resource strain: Not on file  . Food insecurity:    Worry: Never true    Inability: Never true  . Transportation needs:    Medical: No    Non-medical: No  Tobacco Use  . Smoking status: Current Every Day Smoker    Packs/day: 0.50    Years: 48.00    Pack years: 24.00    Types: Cigarettes  . Smokeless tobacco: Former Engineer, water and Sexual Activity  . Alcohol use: No    Comment: History of drinking too much.  Stopped at age 52 yo  . Drug use: Yes    Frequency: 1.0 times per week    Types: Marijuana    Comment: Now and then; history of crack cocaine  . Sexual activity: Not on file  Lifestyle  . Physical activity:    Days per week: Not on file    Minutes per session: Not on file  . Stress: Not at all  Relationships  . Social connections:    Talks on phone: Not on file    Gets together: Not on file    Attends religious service: 1 to 4 times per year    Active member of club or organization: No    Attends meetings of clubs or organizations: Never    Relationship status: Not on file  . Intimate partner violence:    Fear of current or ex partner: Not on file    Emotionally abused: Not on file    Physically abused: Not on file    Forced sexual activity: Not on file  Other Topics Concern  . Not on file  Social History Narrative   Lives alone in an apartment on the 10th floor.  Has 3 children but one is deceased.  Works at an Engineer, maintenance.  Education: 11th grade.    Review of Systems     Objective:   Physical Exam NAD HEENT:  PERRL, EOMI, TMs  pearly gray, throat without injection.  4 teeth scattered in lower jaw with gingival recession, caries and tartar build up Neck:  Supple, No adenopathy, No thyromegaly Chest:  CTA CV:  RRR with normal S1 and S2, No S3,  S4 or murmur.  No carotid bruits.  Carotid, radial and DP pulses normal and equal Abd: Protuberant.  Soft, NT, No HSM or mass.  + BS LE:  No edema MS/Neuro:  Left shoulder with full ROM.  Old scars at top of shoulder.  Not clear if + tinels over median nerve at volar wrist or ulnar nerve at medial epicondyle.  States he gets more intense tingling over entire hand several seconds after testing Tinels at above two sites.  Unable to localize the tingling/burning--reportedly his whole hand. Left hip with full ROM--no increased pain with internal or external rotation. Significant stiffness with abduction and external rotation--unable to cross his left ankle over right knee--states no pain, just stiff. NT over L/S spinous processes, paraspinous musculature, sciatic nerve on left, greater trochanter on left.        Assessment & Plan:  1. Hypertension:  Increase to Losartan 50 with HCTZ 12.5 mg daily at [facility]  2.  Hyperlipidemia:  Rosuvastatin 20 mg 1/2 tab by mouth daily.  Follow up with FLP/ liver enzymes in 6 weeks.    3.  Left leg and left arm discomfort:  Start Gabapentin 100 mg at bedtime and titrate to 300 mg at bedtime.  Leg and pelvic pain probably related to chronic calcific tendinitis of iliopsoas muscle on left. Options include referral to Ortho and PT for leg Also for left arm, feel he needs nerve conduction studies, but cannot get this locally and will need to refer out of town. Limited by patient's difficulties with transportation.  Asked if he could check with family members regarding transport to PT in [facility] or possibly to Neurology at [facility] or [facility].  He will think about this.   4.  History of abnormal MRI of brain.  Will see about getting this rechecked  through the orange card.  He has missed 3 separate orders for MRI to follow up since 2017 with Neurology.  Had abnormalities suggestive of demyelinating disease and refused LP.   Follow up for bp check with bmp in 2 weeks.  With me following fasting labs in 6 weeks.

## 2018-07-28 ENCOUNTER — Telehealth: Payer: Self-pay | Admitting: Internal Medicine

## 2018-07-28 NOTE — Telephone Encounter (Signed)
Social Work Intern Barrister's clerk called Patrick Gilbert to welcome him to [facility], and gave him information about mental heath services that we provide.

## 2018-08-02 ENCOUNTER — Emergency Department (HOSPITAL_COMMUNITY): Payer: Self-pay

## 2018-08-02 ENCOUNTER — Emergency Department (HOSPITAL_COMMUNITY)
Admission: EM | Admit: 2018-08-02 | Discharge: 2018-08-02 | Discharge status: Against Medical Advice | Payer: Self-pay | Attending: Emergency Medicine | Admitting: Emergency Medicine

## 2018-08-02 ENCOUNTER — Encounter (HOSPITAL_COMMUNITY): Payer: Self-pay | Admitting: Emergency Medicine

## 2018-08-02 ENCOUNTER — Other Ambulatory Visit: Payer: Self-pay

## 2018-08-02 DIAGNOSIS — R269 Unspecified abnormalities of gait and mobility: Secondary | ICD-10-CM | POA: Insufficient documentation

## 2018-08-02 DIAGNOSIS — Z5320 Procedure and treatment not carried out because of patient's decision for unspecified reasons: Secondary | ICD-10-CM | POA: Insufficient documentation

## 2018-08-02 DIAGNOSIS — F17210 Nicotine dependence, cigarettes, uncomplicated: Secondary | ICD-10-CM | POA: Insufficient documentation

## 2018-08-02 DIAGNOSIS — R42 Dizziness and giddiness: Secondary | ICD-10-CM | POA: Insufficient documentation

## 2018-08-02 DIAGNOSIS — Z79899 Other long term (current) drug therapy: Secondary | ICD-10-CM | POA: Insufficient documentation

## 2018-08-02 DIAGNOSIS — I10 Essential (primary) hypertension: Secondary | ICD-10-CM | POA: Insufficient documentation

## 2018-08-02 DIAGNOSIS — I639 Cerebral infarction, unspecified: Secondary | ICD-10-CM

## 2018-08-02 LAB — COMPREHENSIVE METABOLIC PANEL
ALBUMIN: 4.1 g/dL (ref 3.5–5.0)
ALT: 30 U/L (ref 0–44)
AST: 20 U/L (ref 15–41)
Alkaline Phosphatase: 45 U/L (ref 38–126)
Anion gap: 4 — ABNORMAL LOW (ref 5–15)
BUN: 10 mg/dL (ref 8–23)
CO2: 30 mmol/L (ref 22–32)
Calcium: 9.8 mg/dL (ref 8.9–10.3)
Chloride: 103 mmol/L (ref 98–111)
Creatinine, Ser: 1.16 mg/dL (ref 0.61–1.24)
GFR calc Af Amer: >60 mL/min (ref >60)
GFR calc non Af Amer: >60 mL/min (ref >60)
GLUCOSE: 99 mg/dL (ref 70–99)
POTASSIUM: 3.7 mmol/L (ref 3.5–5.1)
SODIUM: 137 mmol/L (ref 135–145)
Total Bilirubin: 0.7 mg/dL (ref 0.3–1.2)
Total Protein: 6.8 g/dL (ref 6.5–8.1)

## 2018-08-02 LAB — DIFFERENTIAL
Abs Immature Granulocytes: 0.02 10*3/uL (ref 0.00–0.07)
BASOS ABS: 0 10*3/uL (ref 0.0–0.1)
BASOS PCT: 1 %
EOS ABS: 0.2 10*3/uL (ref 0.0–0.5)
EOS PCT: 3 %
IMMATURE GRANULOCYTES: 0 %
Lymphocytes Relative: 43 %
Lymphs Abs: 2.4 10*3/uL (ref 0.7–4.0)
Monocytes Absolute: 0.7 10*3/uL (ref 0.1–1.0)
Monocytes Relative: 12 %
NEUTROS PCT: 41 %
Neutro Abs: 2.2 10*3/uL (ref 1.7–7.7)

## 2018-08-02 LAB — I-STAT TROPONIN, ED: Troponin i, poc: 0 ng/mL (ref 0.00–0.08)

## 2018-08-02 LAB — CBC
HCT: 50.6 % (ref 39.0–52.0)
Hemoglobin: 16.9 g/dL (ref 13.0–17.0)
MCH: 29.2 pg (ref 26.0–34.0)
MCHC: 33.4 g/dL (ref 30.0–36.0)
MCV: 87.5 fL (ref 80.0–100.0)
PLATELETS: 200 10*3/uL (ref 150–400)
RBC: 5.78 MIL/uL (ref 4.22–5.81)
RDW: 13.2 % (ref 11.5–15.5)
WBC: 5.5 10*3/uL (ref 4.0–10.5)
nRBC: 0 % (ref 0.0–0.2)

## 2018-08-02 LAB — I-STAT CHEM 8, ED
BUN: 11 mg/dL (ref 8–23)
CALCIUM ION: 1.22 mmol/L (ref 1.15–1.40)
Chloride: 101 mmol/L (ref 98–111)
Creatinine, Ser: 1 mg/dL (ref 0.61–1.24)
Glucose, Bld: 94 mg/dL (ref 70–99)
HCT: 52 % (ref 39.0–52.0)
HEMOGLOBIN: 17.7 g/dL — AB (ref 13.0–17.0)
Potassium: 3.7 mmol/L (ref 3.5–5.1)
SODIUM: 139 mmol/L (ref 135–145)
TCO2: 28 mmol/L (ref 22–32)

## 2018-08-02 LAB — ETHANOL: Alcohol, Ethyl (B): <10 mg/dL (ref <10)

## 2018-08-02 LAB — PROTIME-INR
INR: 1.07
PROTHROMBIN TIME: 13.8 s (ref 11.4–15.2)

## 2018-08-02 LAB — APTT: aPTT: 30 seconds (ref 24–36)

## 2018-08-02 MED ORDER — PANTOPRAZOLE SODIUM 20 MG PO TBEC: 40.0000 mg | DELAYED_RELEASE_TABLET | Freq: Every day | ORAL | 0 refills | Status: DC

## 2018-08-02 NOTE — ED Notes (Signed)
Pt apologized to Dr. Dalene Seltzer "for being rude" -- wants to come back on Friday for an MRI- explained that the ED is unable to make an appt. Encouraged to return- dr Dalene Seltzer explained reasoning for need to stay at length.

## 2018-08-02 NOTE — Discharge Instructions (Signed)
You presented with symptoms of being off balance, which can be a stroke symptom and had a head CT which shows concern for a stroke. We would like to continue your work up for stroke by ordering an MRI of your brain and admitting you to the hospital, however you are leaving against medical advice. If you change your mind, you are encouraged to return to the ED for MRI and further stroke evaluation.

## 2018-08-02 NOTE — ED Triage Notes (Signed)
Patient with dizziness since yesterday morning.  Patient states that he does have a headache with the dizziness, no nausea or vomiting.  Patient has equal hand grips, equal pedal pushes.  No facial droop, no slurred speech.

## 2018-08-02 NOTE — ED Provider Notes (Signed)
[facility] EMERGENCY DEPARTMENT Provider Note   CSN: [id number] Arrival date & time: 08/02/18  7482     History   Chief Complaint Chief Complaint  Patient presents with  . Dizziness    HPI JIMMYE MAULE is a 62 y.o. male who presents emergency department with chief complaint of disequilibrium.  The patient is a difficult historian which limits the HPI to some degree.  The patient states that around 9 AM yesterday morning when he awoke he sat on the edge of the bed and broke out into diaphoretic sweat and felt like he was off balance.  He said that it was fairly significant.  He was able to ambulate but at times had to hold the wall or catch himself.  He denies vertiginous symptoms or feelings of presyncope.  He denies racing or skipping in his heart, shortness of breath, nausea, vomiting.  The patient states that his symptoms seem to improve throughout the day he was able to help his niece move.  He said that at times he would have to catch himself but he felt much better.  He noticed yesterday that he had intermittent episodes of feeling pain with swallowing or becoming diaphoretic.  Morning his symptoms returned and he came to the emergency department for evaluation.  He denies any active feelings of disequilibrium at this time.  He denies any current chest pain, shortness of breath, nausea or vomiting.  Patient states that his blood pressure medication was recently changed.  He said they increased his dose.  According to the pharmacy tech he is taking his new prescription of losartan as well as the old prescription which is about 1-1/2 times the original dose however it does not appear to be affecting his blood pressure currently.  Patient denies any previous history of heart attack or stroke.  He is a current daily smoker.  He has a history of hypertension and hyperlipidemia.  HPI  Past Medical History:  Diagnosis Date  . Headache 03/2016  . High blood pressure   .  High cholesterol     Patient Active Problem List   Diagnosis Date Noted  . Left leg pain 07/27/2018  . Numbness and tingling in left hand 07/27/2018  . Essential hypertension 07/27/2018  . Hyperlipidemia 07/27/2018  . Abnormal brain MRI   . Chest pain 03/27/2016  . AKI (acute kidney injury) (HCC) 03/27/2016  . Headache 03/27/2016  . Tobacco abuse 03/27/2016  . Abnormal EKG   . Cephalalgia   . Pain in the chest     Past Surgical History:  Procedure Laterality Date  . Repair of left shoulder laceration     cut with broken glass bottle.  Missed a major artery by just a small measurement per patient        Home Medications    Prior to Admission medications   Medication Sig Start Date End Date Taking? Authorizing Provider  gabapentin (NEURONTIN) 100 MG capsule Take 1 capsule (100 mg total) by mouth 3 (three) times daily. 07/27/18  Yes Julieanne Manson, MD  losartan (COZAAR) 25 MG tablet Take 25 mg by mouth daily. 03/10/16  Yes [provider]  losartan-hydrochlorothiazide (HYZAAR) 50-12.5 MG tablet Take 1 tablet by mouth daily. 07/27/18  Yes Julieanne Manson, MD  rosuvastatin (CRESTOR) 20 MG tablet 1/2 tab by mouth daily with evening meal 07/27/18  Yes Julieanne Manson, MD    Family History Family History  Problem Relation Age of Onset  . Hypertension Mother   .  Heart disease Mother        unknown open heart surgery  . Hypertension Father   . Peripheral vascular disease Father   . Heart disease Brother        Unknown heart issue  . Seizures Daughter     Social History Social History   Tobacco Use  . Smoking status: Current Every Day Smoker    Packs/day: 0.50    Years: 48.00    Pack years: 24.00    Types: Cigarettes  . Smokeless tobacco: Former Engineer, water Use Topics  . Alcohol use: No    Comment: History of drinking too much.  Stopped at age 51 yo  . Drug use: Yes    Frequency: 1.0 times per week    Types: Marijuana    Comment: Now  and then; history of crack cocaine     Allergies   Lipitor [atorvastatin calcium]   Review of Systems Review of Systems  Ten systems reviewed and are negative for acute change, except as noted in the HPI.   Physical Exam Updated Vital Signs BP (!) 156/108 (BP Location: Right Arm)   Pulse (!) 102   Temp 98.2 F (36.8 C) (Oral)   Resp 15   SpO2 99%   Physical Exam  Constitutional: He is oriented to person, place, and time. He appears well-developed and well-nourished. No distress.  HENT:  Head: Normocephalic and atraumatic.  Eyes: Conjunctivae are normal. No scleral icterus.  Neck: Normal range of motion. Neck supple.  Cardiovascular: Normal rate, regular rhythm and normal heart sounds.  Pulmonary/Chest: Effort normal and breath sounds normal. No respiratory distress.  Abdominal: Soft. There is no tenderness.  Musculoskeletal: He exhibits no edema.  Neurological: He is alert and oriented to person, place, and time.  Speech is clear and goal oriented, follows commands Major Cranial nerves without deficit, no facial droop Normal strength in upper and lower extremities bilaterally including dorsiflexion and plantar flexion, strong and equal grip strength Sensation normal to light and sharp touch Moves extremities without ataxia, coordination intact Normal finger to nose and rapid alternating movements Neg romberg, no pronator drift Normal gait Normal heel-shin and balance   Skin: Skin is warm and dry. He is not diaphoretic.  Psychiatric: His behavior is normal.  Nursing note and vitals reviewed.    ED Treatments / Results  Labs (all labs ordered are listed, but only abnormal results are displayed) Labs Reviewed  ETHANOL  PROTIME-INR  APTT  CBC  DIFFERENTIAL  COMPREHENSIVE METABOLIC PANEL  RAPID URINE DRUG SCREEN, HOSP PERFORMED  URINALYSIS, ROUTINE W REFLEX MICROSCOPIC  I-STAT CHEM 8, ED  I-STAT TROPONIN, ED    EKG EKG Interpretation  Date/Time:  Monday  August 02 2018 06:35:04 EST Ventricular Rate:  96 PR Interval:    QRS Duration: 87 QT Interval:  356 QTC Calculation: 450 R Axis:   73 Text Interpretation:  Sinus rhythm Biatrial enlargement Borderline repolarization abnormality No significant change was found Confirmed by Glynn Octave [phone]) on 08/02/2018 6:47:34 AM   Radiology No results found.  Procedures Procedures (including critical care time)  Medications Ordered in ED Medications - No data to display   Initial Impression / Assessment and Plan / ED Course  I have reviewed the triage vital signs and the nursing notes.  Pertinent labs & imaging results that were available during my care of the patient were reviewed by me and considered in my medical decision making (see chart for details).     CT  results concerning for infarct.  Patient informed of the findings however has refused to stay any longer and is irritated.  Both myself and Dr. Dalene Seltzer have spoken with the patient about need for further work-up however he is declining.  He appears to have the capacity to make this decision.  Date: 08/02/2018 Patient: [name] Attending Provider: Dr. Delman Kitten or his authorized caregiver has made the decision for the patient to leave the emergency department against the advice of Dr Alvira Monday.  He or his authorized caregiver has been informed and understands the inherent risks, including death.  He or his authorized caregiver has decided to accept the responsibility for this decision. [name] and all necessary parties have been advised that he may return for further evaluation or treatment. His condition at time of discharge was Good.  [name] had current vital signs as follows:  Blood pressure 131/90, pulse 73, temperature 98.2 F (36.8 C), temperature source Oral, resp. rate 13, SpO2 97 %.    Arthor Captain 08/03/2018     Final Clinical Impressions(s) / ED Diagnoses    Final diagnoses:  None    ED Discharge Orders    None       Arthor Captain, PA-C 08/03/18 1805    Alvira Monday, MD 08/04/18 [phone]

## 2018-08-02 NOTE — ED Notes (Signed)
Pt called out for water. This NT went into room and pt was sitting on the end of the bed with both side rails up. Pt sts "If the doctor doesn't get in here right now, i'm leaving" Told pt that MD and PA were both stuck with a very critical patient and will be in as soon as possible. Dalene Seltzer, MD and [place], [place] made aware. Schlossman at bedside.

## 2018-08-03 ENCOUNTER — Other Ambulatory Visit: Payer: Self-pay

## 2018-08-03 ENCOUNTER — Emergency Department (HOSPITAL_COMMUNITY): Payer: Self-pay

## 2018-08-03 ENCOUNTER — Telehealth: Payer: Self-pay | Admitting: Neurology

## 2018-08-03 ENCOUNTER — Emergency Department (HOSPITAL_COMMUNITY)
Admission: EM | Admit: 2018-08-03 | Discharge: 2018-08-03 | Disposition: A | Discharge status: Home / Self Care | Payer: Self-pay | Attending: Emergency Medicine | Admitting: Emergency Medicine

## 2018-08-03 ENCOUNTER — Encounter (HOSPITAL_COMMUNITY): Payer: Self-pay | Admitting: Emergency Medicine

## 2018-08-03 DIAGNOSIS — F17210 Nicotine dependence, cigarettes, uncomplicated: Secondary | ICD-10-CM | POA: Insufficient documentation

## 2018-08-03 DIAGNOSIS — R42 Dizziness and giddiness: Secondary | ICD-10-CM | POA: Insufficient documentation

## 2018-08-03 DIAGNOSIS — G35 Multiple sclerosis: Secondary | ICD-10-CM

## 2018-08-03 DIAGNOSIS — R519 Headache, unspecified: Secondary | ICD-10-CM

## 2018-08-03 DIAGNOSIS — R51 Headache: Secondary | ICD-10-CM | POA: Insufficient documentation

## 2018-08-03 DIAGNOSIS — I10 Essential (primary) hypertension: Secondary | ICD-10-CM | POA: Insufficient documentation

## 2018-08-03 DIAGNOSIS — Z79899 Other long term (current) drug therapy: Secondary | ICD-10-CM | POA: Insufficient documentation

## 2018-08-03 MED ORDER — SODIUM CHLORIDE 0.9 % IV SOLN
1000.0000 mg | Freq: Once | INTRAVENOUS | Status: AC
  Administered 2018-08-03: 1000 mg via INTRAVENOUS
  Filled 2018-08-03: qty 8

## 2018-08-03 MED ORDER — LORAZEPAM 1 MG PO TABS
1.0000 mg | ORAL_TABLET | Freq: Once | ORAL | Status: AC
  Administered 2018-08-03: 1 mg via ORAL
  Filled 2018-08-03: qty 1

## 2018-08-03 NOTE — Telephone Encounter (Signed)
I received the phone call from ED Dr. Jeraldine Loots, Molly Maduro for newly diagnosed MS, gait abnormality, please fit patient in our schedule soon

## 2018-08-03 NOTE — ED Triage Notes (Signed)
Pt in from home with c/o unresolved headache. Was seen yesterday for HA and dizziness, but left prior to getting MRI done. Wants MRI today, denies dizziness, c/o night sweats. MAE's equally

## 2018-08-03 NOTE — ED Notes (Signed)
Pt verbalized understanding to follow up with neurology in the next 3 days. VSS, NAD.

## 2018-08-03 NOTE — ED Provider Notes (Signed)
10:56 AM Patient aware of MRI results, which I discussed with our neurology colleagues, and subsequently with our neurology clinic for expedited follow-up. Patient is not amenable to staying for additional evaluation, but will receive first dose of Solu-Medrol for what appears to be new diagnosis of multiple sclerosis. Patient understands the importance of following up.   Gerhard Munch, MD 08/03/18 1056

## 2018-08-03 NOTE — Telephone Encounter (Signed)
Can you pls call patient and let him know we received records from the ER. He received 1 dose of IV Solumedrol in the ER today, usually he needs to get this for 3 days, would need order for 1000mg  [facility]rol [facility], 2 doses starting tomorrow, then Thurs. Can he come for f/u on 11/27 at 1pm? Thanks

## 2018-08-03 NOTE — ED Notes (Signed)
Patient transported to MRI 

## 2018-08-03 NOTE — Discharge Instructions (Addendum)
It is extremely important that you follow up with your neurologist.  If there are any issues with follow-up, or you develop new or concerning changes in your condition, please return here for additional care.

## 2018-08-03 NOTE — ED Provider Notes (Signed)
[facility] EMERGENCY DEPARTMENT Provider Note   CSN: [id number] Arrival date & time: 08/03/18  0539     History   Chief Complaint Chief Complaint  Patient presents with  . Headache    HPI Patrick Gilbert is a 62 y.o. male.  The history is provided by the patient.  He has history of hypertension, hyperlipidemia and comes in with ongoing intermittent dizziness.  Dizziness and headache started 2 days ago.  He describes a feeling like he was drunk, and a bifrontal headache.  Symptoms have been waxing and waning.  He currently has mild dizziness and no headache.  He has noted that, when dizziness is present, he has slight difficulty with walking.  He denies any weakness or numbness or tingling.  There is been no nausea or vomiting.  He had been in the emergency department yesterday, and MRI of the brain was recommended, but he did not want to wait and left AGAINST MEDICAL ADVICE.  He is now returning stating he is willing to have the MRI scan.  He denies fever, chills, sweats.  Denies any chest pain.   Past Medical History:  Diagnosis Date  . Headache 03/2016  . High blood pressure   . High cholesterol     Patient Active Problem List   Diagnosis Date Noted  . Left leg pain 07/27/2018  . Numbness and tingling in left hand 07/27/2018  . Essential hypertension 07/27/2018  . Hyperlipidemia 07/27/2018  . Abnormal brain MRI   . Chest pain 03/27/2016  . AKI (acute kidney injury) (HCC) 03/27/2016  . Headache 03/27/2016  . Tobacco abuse 03/27/2016  . Abnormal EKG   . Cephalalgia   . Pain in the chest     Past Surgical History:  Procedure Laterality Date  . Repair of left shoulder laceration     cut with broken glass bottle.  Missed a major artery by just a small measurement per patient        Home Medications    Prior to Admission medications   Medication Sig Start Date End Date Taking? Authorizing Provider  gabapentin (NEURONTIN) 100 MG capsule Take 1  capsule (100 mg total) by mouth 3 (three) times daily. 07/27/18   Julieanne Manson, MD  losartan (COZAAR) 25 MG tablet Take 25 mg by mouth daily. 03/10/16   [provider]  losartan-hydrochlorothiazide (HYZAAR) 50-12.5 MG tablet Take 1 tablet by mouth daily. 07/27/18   Julieanne Manson, MD  pantoprazole (PROTONIX) 20 MG tablet Take 2 tablets (40 mg total) by mouth daily for 14 days. 08/02/18 08/16/18  Alvira Monday, MD  rosuvastatin (CRESTOR) 20 MG tablet 1/2 tab by mouth daily with evening meal 07/27/18   Julieanne Manson, MD    Family History Family History  Problem Relation Age of Onset  . Hypertension Mother   . Heart disease Mother        unknown open heart surgery  . Hypertension Father   . Peripheral vascular disease Father   . Heart disease Brother        Unknown heart issue  . Seizures Daughter     Social History Social History   Tobacco Use  . Smoking status: Current Every Day Smoker    Packs/day: 0.50    Years: 48.00    Pack years: 24.00    Types: Cigarettes  . Smokeless tobacco: Former Engineer, water Use Topics  . Alcohol use: No    Comment: History of drinking too much.  Stopped at age  13 yo  . Drug use: Yes    Frequency: 1.0 times per week    Types: Marijuana    Comment: Now and then; history of crack cocaine     Allergies   Lipitor [atorvastatin calcium]   Review of Systems Review of Systems  All other systems reviewed and are negative.    Physical Exam Updated Vital Signs BP (!) 159/93 (BP Location: Right Arm)   Pulse 96   Temp (!) 97.5 F (36.4 C) (Oral)   Resp 16   Ht 5\' 6"  (1.676 m)   Wt 91.6 kg   SpO2 100%   BMI 32.60 kg/m   Physical Exam  Nursing note and vitals reviewed.  62 year old male, resting comfortably and in no acute distress. Vital signs are significant for elevated blood pressure. Oxygen saturation is 100%, which is normal. Head is normocephalic and atraumatic. PERRLA, EOMI. Oropharynx is clear.   Fundi show no hemorrhage, exudate, papilledema. Neck is nontender and supple without adenopathy or JVD.  There are no carotid bruits. Back is nontender and there is no CVA tenderness. Lungs are clear without rales, wheezes, or rhonchi. Chest is nontender. Heart has regular rate and rhythm without murmur. Abdomen is soft, flat, nontender without masses or hepatosplenomegaly and peristalsis is normoactive. Extremities have no cyanosis or edema, full range of motion is present. Skin is warm and dry without rash. Neurologic: Mental status is normal, cranial nerves are intact, there are no motor or sensory deficits.  Finger-to-nose testing is normal.  There is no pronator drift.  Romberg is negative.  ED Treatments / Results   Radiology Dg Chest 2 View  Result Date: 08/02/2018 CLINICAL DATA:  Headache and dizziness EXAM: CHEST - 2 VIEW COMPARISON:  February 25, 2017 FINDINGS: There is no edema or consolidation. The heart size and pulmonary vascularity are normal. No adenopathy. No pneumothorax. There is mild degenerative change in the thoracic spine. IMPRESSION: No edema or consolidation. Electronically Signed   By: Bretta Bang III M.D.   On: 08/02/2018 07:55   Ct Head Wo Contrast  Result Date: 08/02/2018 CLINICAL DATA:  62 year old hypertensive male with history of hyperlipidemia and smoking presents with slurred speech and dizziness this morning. Initial encounter. EXAM: CT HEAD WITHOUT CONTRAST TECHNIQUE: Contiguous axial images were obtained from the base of the skull through the vertex without intravenous contrast. COMPARISON:  07/31/2016 head CT.  03/27/2016 brain MR. FINDINGS: Brain: New small hypodensity left genu of the corpus callosum (series 3, image 13). It is possible this represents result of an infarct although given abnormal brain MR, demyelinating process cannot be excluded. Remote right cerebellar infarct. Moderate non-specific white matter changes as noted on prior MR. No  intracranial hemorrhage. No intracranial mass lesion noted on this unenhanced exam. Vascular: Symmetric hyperdensity of the middle cerebral arteries. Slight hyperdensity basilar artery unchanged. Skull: No acute abnormality. Sinuses/Orbits: No acute orbital abnormality. Minimal mucosal thickening frontal sinuses, left sphenoid sinus and ethmoid sinus air cells. Mastoid air cells and middle ear cavities are clear. Other: Negative. IMPRESSION: 1. New small hypodensity left genu of the corpus callosum (series 3, image 13). It is possible this represents result of an infarct although given abnormal brain MR, demyelinating process cannot be excluded. 2. Remote right cerebellar infarct. 3. Moderate non-specific white matter changes as noted on prior MR. 4. MR may prove helpful for further delineation. Electronically Signed   By: Lacy Duverney M.D.   On: 08/02/2018 08:20    Procedures Procedures  Medications Ordered in ED Medications - No data to display   Initial Impression / Assessment and Plan / ED Course  I have reviewed the triage vital signs and the nursing notes.  Pertinent imaging results that were available during my care of the patient were reviewed by me and considered in my medical decision making (see chart for details).  Headache and dizziness which may be representing either peripheral or central vertigo.  Old records are reviewed confirming ED visit yesterday at which time laboratory work-up was unremarkable.  CT yesterday showed a small hypodensity in the left genuine of the corpus callosum and remote right cerebellar infarct.  He will be sent for MRI scan today.  Case is signed out to Dr. Jeraldine Loots.  Final Clinical Impressions(s) / ED Diagnoses   Final diagnoses:  Acute nonintractable headache, unspecified headache type  Dizziness    ED Discharge Orders    None       Dione Booze, MD 08/03/18 [phone]

## 2018-08-04 ENCOUNTER — Telehealth: Payer: Self-pay | Admitting: Internal Medicine

## 2018-08-04 NOTE — Telephone Encounter (Signed)
noted 

## 2018-08-04 NOTE — Telephone Encounter (Signed)
Patient wants doctor Delrae Alfred to know that he went to back to [facility] and had an MRI.  He was told he did not have a stroke, but had nerve damage.

## 2018-08-04 NOTE — Telephone Encounter (Signed)
Called pt yesterday before leaving the office.  There was no answer and no VM set up.  Was unable to relay message below.

## 2018-08-04 NOTE — Telephone Encounter (Signed)
Spoke with pt.  He was not making much sense.  States that he was told that a visit with our office would cost roughly $200 and he does not have that to spend.  I advised that I did not handle billing so I am unsure how much an office visit would cost.  He kept saying "that medicine costs too much for me" - I assume in regards to Solumedrol.  Appointment made for Thursday November 27 @ 1:30pm.  Pt states that he will be here around 1:35pm since he is on the bus.  Pt again asks me how much the visit would be.  I advised that I did not know.  Pt states he has the [employer].

## 2018-08-10 ENCOUNTER — Ambulatory Visit: Payer: Self-pay

## 2018-08-10 VITALS — BP 126/70 | HR 76

## 2018-08-10 DIAGNOSIS — I10 Essential (primary) hypertension: Secondary | ICD-10-CM

## 2018-08-10 NOTE — Progress Notes (Signed)
Patient BP in normal range. informed to continue current dose of medication. Patient verbalized understanding 

## 2018-08-11 ENCOUNTER — Ambulatory Visit: Payer: Self-pay | Admitting: Neurology

## 2018-08-11 LAB — BASIC METABOLIC PANEL
BUN/Creatinine Ratio: 12 (ref 10–24)
BUN: 11 mg/dL (ref 8–27)
CO2: 23 mmol/L (ref 20–29)
CREATININE: 0.89 mg/dL (ref 0.76–1.27)
Calcium: 10.1 mg/dL (ref 8.6–10.2)
Chloride: 99 mmol/L (ref 96–106)
GFR calc Af Amer: 106 mL/min/{1.73_m2} (ref >59)
GFR, EST NON AFRICAN AMERICAN: 92 mL/min/{1.73_m2} (ref >59)
Glucose: 94 mg/dL (ref 65–99)
Potassium: 4 mmol/L (ref 3.5–5.2)
Sodium: 139 mmol/L (ref 134–144)

## 2018-08-30 ENCOUNTER — Other Ambulatory Visit: Payer: Self-pay

## 2018-08-30 MED ORDER — LOSARTAN POTASSIUM-HCTZ 50-12.5 MG PO TABS: 1.0000 | ORAL_TABLET | Freq: Every day | ORAL | 11 refills | Status: DC

## 2018-08-31 ENCOUNTER — Other Ambulatory Visit: Payer: Self-pay

## 2018-08-31 MED ORDER — LOSARTAN POTASSIUM 50 MG PO TABS: 50.0000 mg | ORAL_TABLET | Freq: Every day | ORAL | 5 refills | Status: DC

## 2018-08-31 MED ORDER — HYDROCHLOROTHIAZIDE 12.5 MG PO TABS: 12.5000 mg | ORAL_TABLET | Freq: Every day | ORAL | 5 refills | Status: DC

## 2018-09-02 ENCOUNTER — Emergency Department (HOSPITAL_COMMUNITY)
Admission: EM | Admit: 2018-09-02 | Discharge: 2018-09-02 | Disposition: A | Discharge status: Home / Self Care | Payer: Self-pay | Attending: Emergency Medicine | Admitting: Emergency Medicine

## 2018-09-02 ENCOUNTER — Encounter (HOSPITAL_COMMUNITY): Payer: Self-pay | Admitting: Emergency Medicine

## 2018-09-02 ENCOUNTER — Other Ambulatory Visit: Payer: Self-pay

## 2018-09-02 DIAGNOSIS — M7918 Myalgia, other site: Secondary | ICD-10-CM | POA: Insufficient documentation

## 2018-09-02 DIAGNOSIS — I10 Essential (primary) hypertension: Secondary | ICD-10-CM | POA: Insufficient documentation

## 2018-09-02 DIAGNOSIS — F17210 Nicotine dependence, cigarettes, uncomplicated: Secondary | ICD-10-CM | POA: Insufficient documentation

## 2018-09-02 DIAGNOSIS — Z79899 Other long term (current) drug therapy: Secondary | ICD-10-CM | POA: Insufficient documentation

## 2018-09-02 MED ORDER — CYCLOBENZAPRINE HCL 10 MG PO TABS: 10.0000 mg | ORAL_TABLET | Freq: Every day | ORAL | 0 refills | Status: DC

## 2018-09-02 NOTE — ED Provider Notes (Signed)
Patrick Gilbert   CSN: [id number] Arrival date & time: 09/02/18  4098     History   Chief Complaint Chief Complaint  Patient presents with  . Dizziness  . Cough    HPI Patrick Gilbert is a 62 y.o. male.  Patient presents with complaint of pain in the right side of his neck into his shoulder described as sharp, stabbing. He reports it has been there on multiple occasions when he wakes after sleeping on his right side. He takes ibuprofen and the pain resolves. He states that when the pain is severe, it causes dizziness, which also resolves with ibuprofen. No headache, nausea, extremity weakness or numbness. He denies symptoms during the daytime. No visual changes. He reports he has had symptoms of "sinus" with runny nose but no fever or cough.   The history is provided by the patient. No language interpreter was used.    Past Medical History:  Diagnosis Date  . Headache 03/2016  . High blood pressure   . High cholesterol     Patient Active Problem List   Diagnosis Date Noted  . Left leg pain 07/27/2018  . Numbness and tingling in left hand 07/27/2018  . Essential hypertension 07/27/2018  . Hyperlipidemia 07/27/2018  . Abnormal brain MRI   . Chest pain 03/27/2016  . AKI (acute kidney injury) (HCC) 03/27/2016  . Headache 03/27/2016  . Tobacco abuse 03/27/2016  . Abnormal EKG   . Cephalalgia   . Pain in the chest     Past Surgical History:  Procedure Laterality Date  . Repair of left shoulder laceration     cut with broken glass bottle.  Missed a major artery by just a small measurement per patient        Home Medications    Prior to Admission medications   Medication Sig Start Date End Date Taking? Authorizing Provider  gabapentin (NEURONTIN) 100 MG capsule Take 1 capsule (100 mg total) by mouth 3 (three) times daily. 07/27/18   Julieanne Manson, MD  hydrochlorothiazide (HYDRODIURIL) 12.5 MG tablet Take 1  tablet (12.5 mg total) by mouth daily. 08/31/18   Julieanne Manson, MD  losartan (COZAAR) 50 MG tablet Take 1 tablet (50 mg total) by mouth daily. 08/31/18   Julieanne Manson, MD  losartan-hydrochlorothiazide (HYZAAR) 50-12.5 MG tablet Take 1 tablet by mouth daily. 08/30/18   Julieanne Manson, MD  pantoprazole (PROTONIX) 20 MG tablet Take 2 tablets (40 mg total) by mouth daily for 14 days. 08/02/18 08/16/18  Alvira Monday, MD  rosuvastatin (CRESTOR) 20 MG tablet 1/2 tab by mouth daily with evening meal 07/27/18   Julieanne Manson, MD    Family History Family History  Problem Relation Age of Onset  . Hypertension Mother   . Heart disease Mother        unknown open heart surgery  . Hypertension Father   . Peripheral vascular disease Father   . Heart disease Brother        Unknown heart issue  . Seizures Daughter     Social History Social History   Tobacco Use  . Smoking status: Current Every Day Smoker    Packs/day: 0.50    Years: 48.00    Pack years: 24.00    Types: Cigarettes  . Smokeless tobacco: Former Engineer, water Use Topics  . Alcohol use: No    Comment: History of drinking too much.  Stopped at age 51 yo  . Drug use: Yes  Frequency: 1.0 times per week    Types: Marijuana    Comment: Now and then; history of crack cocaine     Allergies   Lipitor [atorvastatin calcium]   Review of Systems Review of Systems  Constitutional: Negative for chills and fever.  HENT: Positive for rhinorrhea.   Eyes: Negative for visual disturbance.  Respiratory: Negative.  Negative for cough.   Cardiovascular: Negative.   Gastrointestinal: Negative.  Negative for nausea and vomiting.  Musculoskeletal: Positive for neck pain (See HPI.).  Skin: Negative.   Neurological: Negative.  Negative for weakness, numbness and headaches.     Physical Exam Updated Vital Signs Wt 86.2 kg   SpO2 100%   BMI 30.67 kg/m   Physical Exam Constitutional:      Appearance: He  is well-developed.  HENT:     Head: Normocephalic.  Neck:     Musculoskeletal: Normal range of motion and neck supple.  Cardiovascular:     Rate and Rhythm: Normal rate and regular rhythm.  Pulmonary:     Effort: Pulmonary effort is normal.     Breath sounds: Normal breath sounds. No wheezing, rhonchi or rales.  Chest:     Chest wall: No tenderness.  Abdominal:     General: Bowel sounds are normal.     Palpations: Abdomen is soft.     Tenderness: There is no abdominal tenderness. There is no guarding or rebound.  Musculoskeletal: Normal range of motion.     Comments: No midline cervical tenderness. No paracervical tenderness or swelling. There is minimal tenderness of trapezius on right. FROM with full strength of right UE.   Skin:    General: Skin is warm and dry.     Findings: No rash.  Neurological:     General: No focal deficit present.     Mental Status: He is alert and oriented to person, place, and time.     Sensory: No sensory deficit.     Coordination: Coordination normal.      ED Treatments / Results  Labs (all labs ordered are listed, but only abnormal results are displayed) Labs Reviewed - No data to display  EKG EKG Interpretation  Date/Time:  Thursday September 02 2018 03:15:46 EST Ventricular Rate:  73 PR Interval:    QRS Duration: 88 QT Interval:  363 QTC Calculation: 400 R Axis:   68 Text Interpretation:  Sinus rhythm Borderline repolarization abnormality No significant change since last tracing Confirmed by Rochele RaringWard, Kristen [id number]) on 09/02/2018 3:18:57 AM   Radiology No results found.  Procedures Procedures (including critical care time)  Medications Ordered in ED Medications - No data to display   Initial Impression / Assessment and Plan / ED Course  I have reviewed the triage vital signs and the nursing notes.  Pertinent labs & imaging results that were available during my care of the patient were reviewed by me and considered in my  medical decision making (see chart for details).     Patient to ED with recurrent right neck pain on waking, resolved with ibuprofen. He has no complaints at present.   Presentation and exam consistent with musculoskeletal pain, likely spasm. Will give Rx Flexeril to take one qHS. Recommend PCP follow up to insure improvement.   On Chart review, the patient was found to have been diagnosed with MS at the end of November. He was referred to [facility], however, does not appear he was even seen despite documentation of the office reaching out to him. He states  he could not afford the $200 office visit fee and that they provided a referral elsewhere. He lost that information. No documentation regarding referral. Discussed with the patient that it was highly important that he follow up for further evaluation and treatment of MS. He was provided the contact information of [facility] so that he can pursue getting the referral information given to him previously.   Final Clinical Impressions(s) / ED Diagnoses   Final diagnoses:  None   1. Musculoskeletal pain  ED Discharge Orders    None       Elpidio Anis, PA-C 09/02/18 4098    Ward, Layla Maw, DO 09/02/18 [phone]

## 2018-09-02 NOTE — ED Notes (Signed)
Patient verbalizes understanding of discharge instructions. Opportunity for questioning and answers were provided. Armband removed by staff, pt discharged from ED. Pt ambulated out to lobby  

## 2018-09-02 NOTE — Discharge Instructions (Addendum)
Take a Flexeril before bedtime to see if this helps your recurrent neck pain. You can continue ibuprofen.   Regarding your previous visit where you were referred to neurology, Dr. Terrace Arabia. Call their office to ask about the office where you were referred for further treatment.

## 2018-09-02 NOTE — ED Triage Notes (Addendum)
Pt in from home with dizziness when waking at 0200. States he got a sharp nerve pain in R neck to shoulder, bilateral blurred vision and got nauseous, took an Ibuprofen and pain has resolved. Has cough and sinus drainage, white sputum, denies fevers. Temp 98.3, has also been out of BP meds x 1 wk, BP 152/88. MAE's equally

## 2018-09-13 ENCOUNTER — Other Ambulatory Visit: Payer: PRIVATE HEALTH INSURANCE

## 2018-09-17 ENCOUNTER — Ambulatory Visit: Payer: PRIVATE HEALTH INSURANCE | Admitting: Internal Medicine

## 2018-09-21 ENCOUNTER — Other Ambulatory Visit: Payer: Self-pay

## 2018-09-27 ENCOUNTER — Ambulatory Visit: Payer: Self-pay | Admitting: Internal Medicine

## 2018-10-11 ENCOUNTER — Other Ambulatory Visit: Payer: Self-pay

## 2018-10-11 DIAGNOSIS — Z79899 Other long term (current) drug therapy: Secondary | ICD-10-CM

## 2018-10-11 DIAGNOSIS — E785 Hyperlipidemia, unspecified: Secondary | ICD-10-CM

## 2018-10-12 LAB — LIPID PANEL W/O CHOL/HDL RATIO
Cholesterol, Total: 143 mg/dL (ref 100–199)
HDL: 44 mg/dL (ref >39)
LDL Calculated: 77 mg/dL (ref 0–99)
Triglycerides: 110 mg/dL (ref 0–149)
VLDL Cholesterol Cal: 22 mg/dL (ref 5–40)

## 2018-10-12 LAB — HEPATIC FUNCTION PANEL
ALBUMIN: 4.8 g/dL (ref 3.8–4.8)
ALK PHOS: 51 IU/L (ref 39–117)
ALT: 45 IU/L — ABNORMAL HIGH (ref 0–44)
AST: 21 IU/L (ref 0–40)
BILIRUBIN TOTAL: 0.6 mg/dL (ref 0.0–1.2)
BILIRUBIN, DIRECT: 0.16 mg/dL (ref 0.00–0.40)
TOTAL PROTEIN: 7 g/dL (ref 6.0–8.5)

## 2018-10-13 ENCOUNTER — Ambulatory Visit: Payer: Self-pay | Admitting: Internal Medicine

## 2019-01-11 ENCOUNTER — Telehealth: Payer: Self-pay | Admitting: Internal Medicine

## 2019-01-17 NOTE — Telephone Encounter (Signed)
See phone note

## 2019-02-25 ENCOUNTER — Ambulatory Visit: Payer: Self-pay | Admitting: Internal Medicine

## 2019-04-22 ENCOUNTER — Encounter (HOSPITAL_COMMUNITY): Payer: Self-pay | Admitting: Emergency Medicine

## 2019-04-22 ENCOUNTER — Emergency Department (HOSPITAL_COMMUNITY): Payer: Self-pay

## 2019-04-22 ENCOUNTER — Emergency Department (HOSPITAL_COMMUNITY)
Admission: EM | Admit: 2019-04-22 | Discharge: 2019-04-22 | Disposition: A | Discharge status: Home / Self Care | Payer: Self-pay | Attending: Emergency Medicine | Admitting: Emergency Medicine

## 2019-04-22 ENCOUNTER — Other Ambulatory Visit: Payer: Self-pay

## 2019-04-22 DIAGNOSIS — I10 Essential (primary) hypertension: Secondary | ICD-10-CM | POA: Insufficient documentation

## 2019-04-22 DIAGNOSIS — F1290 Cannabis use, unspecified, uncomplicated: Secondary | ICD-10-CM | POA: Insufficient documentation

## 2019-04-22 DIAGNOSIS — F17210 Nicotine dependence, cigarettes, uncomplicated: Secondary | ICD-10-CM | POA: Insufficient documentation

## 2019-04-22 DIAGNOSIS — Z79899 Other long term (current) drug therapy: Secondary | ICD-10-CM | POA: Insufficient documentation

## 2019-04-22 DIAGNOSIS — R109 Unspecified abdominal pain: Secondary | ICD-10-CM | POA: Insufficient documentation

## 2019-04-22 LAB — COMPREHENSIVE METABOLIC PANEL
ALT: 38 U/L (ref 0–44)
AST: 25 U/L (ref 15–41)
Albumin: 3.9 g/dL (ref 3.5–5.0)
Alkaline Phosphatase: 38 U/L (ref 38–126)
Anion gap: 10 (ref 5–15)
BUN: 9 mg/dL (ref 8–23)
CO2: 26 mmol/L (ref 22–32)
Calcium: 9.6 mg/dL (ref 8.9–10.3)
Chloride: 101 mmol/L (ref 98–111)
Creatinine, Ser: 1.04 mg/dL (ref 0.61–1.24)
GFR calc Af Amer: >60 mL/min (ref >60)
GFR calc non Af Amer: >60 mL/min (ref >60)
Glucose, Bld: 93 mg/dL (ref 70–99)
Potassium: 3.6 mmol/L (ref 3.5–5.1)
Sodium: 137 mmol/L (ref 135–145)
Total Bilirubin: 0.6 mg/dL (ref 0.3–1.2)
Total Protein: 6.3 g/dL — ABNORMAL LOW (ref 6.5–8.1)

## 2019-04-22 LAB — CBC
HCT: 44.6 % (ref 39.0–52.0)
Hemoglobin: 15 g/dL (ref 13.0–17.0)
MCH: 29.6 pg (ref 26.0–34.0)
MCHC: 33.6 g/dL (ref 30.0–36.0)
MCV: 88.1 fL (ref 80.0–100.0)
Platelets: 195 10*3/uL (ref 150–400)
RBC: 5.06 MIL/uL (ref 4.22–5.81)
RDW: 13.8 % (ref 11.5–15.5)
WBC: 6 10*3/uL (ref 4.0–10.5)
nRBC: 0 % (ref 0.0–0.2)

## 2019-04-22 LAB — URINALYSIS, ROUTINE W REFLEX MICROSCOPIC
Bacteria, UA: NONE SEEN
Bilirubin Urine: NEGATIVE
Glucose, UA: NEGATIVE mg/dL
Ketones, ur: NEGATIVE mg/dL
Leukocytes,Ua: NEGATIVE
Nitrite: NEGATIVE
Protein, ur: NEGATIVE mg/dL
Specific Gravity, Urine: 1.009 (ref 1.005–1.030)
pH: 7 (ref 5.0–8.0)

## 2019-04-22 LAB — LIPASE, BLOOD: Lipase: 88 U/L — ABNORMAL HIGH (ref 11–51)

## 2019-04-22 MED ORDER — SODIUM CHLORIDE 0.9% FLUSH: 3.0000 mL | Freq: Once | INTRAVENOUS | Status: DC

## 2019-04-22 MED ORDER — IOHEXOL 300 MG/ML  SOLN
100.0000 mL | Freq: Once | INTRAMUSCULAR | Status: AC | PRN
  Administered 2019-04-22: 100 mL via INTRAVENOUS

## 2019-04-22 MED ORDER — DICYCLOMINE HCL 20 MG PO TABS: 20.0000 mg | ORAL_TABLET | Freq: Two times a day (BID) | ORAL | 0 refills | Status: DC

## 2019-04-22 MED ORDER — METAMUCIL SMOOTH TEXTURE 58.6 % PO POWD: ORAL | 0 refills | Status: DC

## 2019-04-22 NOTE — ED Triage Notes (Signed)
Pt here for generalized abdominal pain that feels "like gas". Pt endorses his last BM was today and was normal but also states "he feels like he cannot have another one, like something is blocking it." No N/V. No other symptoms. Denies abdominal swelling or weight gain.

## 2019-04-22 NOTE — ED Provider Notes (Signed)
[facility] EMERGENCY DEPARTMENT Provider Note   CSN: [id number] Arrival date & time: 04/22/19  1231     History   Chief Complaint Chief Complaint  Patient presents with  . Abdominal Pain  . Constipation    HPI Patrick Gilbert is a 63 y.o. male who is an extraordinarily poor historian which severely limits the HPI.  He presents today with complaint of abdominal discomfort.  It is very difficult to assess what the patient true complaint is and he is also very poor at describing his symptoms however it seems that the patient developed some constipation after eating macaroni and cheese a week ago.  He took some laxatives and made a big bowel movement.  He says that he feels something like "gas" in his belly but denies pain.  He states for example he had a large bowel movement, then he ate breakfast, then he had urgency to defecate but could not make a bowel movement this morning.  He thinks "I think something is blocked in there."  He states that he thought maybe he had diarrhea because he could not use the bathroom.  He bought Imodium and took Imodium today.  He also took gas pills.  He denies large-volume or frequent brown watery stools.  He states that his stools are formed.  He denies nausea or vomiting.  He has occasional heartburn symptoms.  He denies abdominal distention.     HPI  Past Medical History:  Diagnosis Date  . Headache 03/2016  . High blood pressure   . High cholesterol     Patient Active Problem List   Diagnosis Date Noted  . Left leg pain 07/27/2018  . Numbness and tingling in left hand 07/27/2018  . Essential hypertension 07/27/2018  . Hyperlipidemia 07/27/2018  . Abnormal brain MRI   . Chest pain 03/27/2016  . AKI (acute kidney injury) ([place]) 03/27/2016  . Headache 03/27/2016  . Tobacco abuse 03/27/2016  . Abnormal EKG   . Cephalalgia   . Pain in the chest     Past Surgical History:  Procedure Laterality Date  . Repair of left shoulder  laceration     cut with broken glass bottle.  Missed a major artery by just a small measurement per patient        Home Medications    Prior to Admission medications   Medication Sig Start Date End Date Taking? Authorizing Provider  cyclobenzaprine (FLEXERIL) 10 MG tablet Take 1 tablet (10 mg total) by mouth at bedtime. 09/02/18   Charlann Lange, PA-C  gabapentin (NEURONTIN) 100 MG capsule Take 1 capsule (100 mg total) by mouth 3 (three) times daily. 07/27/18   Mack Hook, MD  hydrochlorothiazide (HYDRODIURIL) 12.5 MG tablet Take 1 tablet (12.5 mg total) by mouth daily. 08/31/18   Mack Hook, MD  losartan (COZAAR) 50 MG tablet Take 1 tablet (50 mg total) by mouth daily. 08/31/18   Mack Hook, MD  losartan-hydrochlorothiazide (HYZAAR) 50-12.5 MG tablet Take 1 tablet by mouth daily. Patient not taking: Reported on 09/02/2018 08/30/18   Mack Hook, MD  pantoprazole (PROTONIX) 20 MG tablet Take 2 tablets (40 mg total) by mouth daily for 14 days. Patient not taking: Reported on 09/02/2018 08/02/18 08/16/18  Gareth Morgan, MD  rosuvastatin (CRESTOR) 20 MG tablet 1/2 tab by mouth daily with evening meal 07/27/18   Mack Hook, MD    Family History Family History  Problem Relation Age of Onset  . Hypertension Mother   . Heart  disease Mother        unknown open heart surgery  . Hypertension Father   . Peripheral vascular disease Father   . Heart disease Brother        Unknown heart issue  . Seizures Daughter     Social History Social History   Tobacco Use  . Smoking status: Current Every Day Smoker    Packs/day: 0.50    Years: 48.00    Pack years: 24.00    Types: Cigarettes  . Smokeless tobacco: Former Engineer, water Use Topics  . Alcohol use: No    Comment: History of drinking too much.  Stopped at age 79 yo  . Drug use: Yes    Frequency: 1.0 times per week    Types: Marijuana    Comment: Now and then; history of crack cocaine      Allergies   Lipitor [atorvastatin calcium]   Review of Systems Review of Systems Ten systems reviewed and are negative for acute change, except as noted in the HPI.    Physical Exam Updated Vital Signs BP (!) 146/94   Pulse 64   Temp 98 F (36.7 C) (Oral)   Resp 16   SpO2 100%   Physical Exam Vitals signs and nursing note reviewed.  Constitutional:      General: He is not in acute distress.    Appearance: He is well-developed. He is not diaphoretic.  HENT:     Head: Normocephalic and atraumatic.  Eyes:     General: No scleral icterus.    Conjunctiva/sclera: Conjunctivae normal.  Neck:     Musculoskeletal: Normal range of motion and neck supple.  Cardiovascular:     Rate and Rhythm: Normal rate and regular rhythm.     Heart sounds: Normal heart sounds.  Pulmonary:     Effort: Pulmonary effort is normal. No respiratory distress.     Breath sounds: Normal breath sounds.  Abdominal:     General: Abdomen is protuberant. Bowel sounds are normal.     Palpations: Abdomen is soft.     Tenderness: There is no abdominal tenderness.  Skin:    General: Skin is warm and dry.  Neurological:     Mental Status: He is alert.  Psychiatric:        Behavior: Behavior normal.      ED Treatments / Results  Labs (all labs ordered are listed, but only abnormal results are displayed) Labs Reviewed  LIPASE, BLOOD - Abnormal; Notable for the following components:      Result Value   Lipase 88 (*)    All other components within normal limits  COMPREHENSIVE METABOLIC PANEL - Abnormal; Notable for the following components:   Total Protein 6.3 (*)    All other components within normal limits  URINALYSIS, ROUTINE W REFLEX MICROSCOPIC - Abnormal; Notable for the following components:   Hgb urine dipstick SMALL (*)    All other components within normal limits  CBC    EKG None  Radiology No results found.  Procedures Procedures (including critical care time)   Medications Ordered in ED Medications  sodium chloride flush (NS) 0.9 % injection 3 mL (has no administration in time range)     Initial Impression / Assessment and Plan / ED Course  I have reviewed the triage vital signs and the nursing notes.  Pertinent labs & imaging results that were available during my care of the patient were reviewed by me and considered in my medical decision making (see  chart for details).        Patient here with abdominal discomfort personally reviewed the patient's labs which show slightly low total protein.  Lipase is slightly elevated however patient has a benign abdominal exam is not actively vomiting.  CBC without elevated white count or other abnormality.  Urine shows small amount of hemoglobin.  CT abdomen and pelvis is pending and I have given signout to PA Arvella MerlesHenry Lee who will review and disposition the patient appropriately.  He is otherwise without active pain or vomiting here.  Final Clinical Impressions(s) / ED Diagnoses   Final diagnoses:  None    ED Discharge Orders    None       Arthor CaptainHarris, Jaiyla Granados, PA-C 04/27/19 1633    Charlynne PanderYao, David Hsienta, MD 05/05/19 [phone]

## 2019-04-22 NOTE — ED Notes (Signed)
Pt discharged with all belongings. Discharge instructions reviewed with pt, and pt verbalized understanding. Opportunity for questions provided.  

## 2019-04-22 NOTE — Discharge Instructions (Addendum)

## 2019-04-22 NOTE — ED Notes (Signed)
Tech was getting vitals from patient. Pt stated 'if I dont get a doctor by 4 im leaving." Tech advised for him to not do that and that we will work on getting him a room.

## 2019-09-02 ENCOUNTER — Other Ambulatory Visit: Payer: Self-pay | Admitting: Internal Medicine

## 2019-12-06 ENCOUNTER — Emergency Department (HOSPITAL_COMMUNITY): Payer: Self-pay

## 2019-12-06 ENCOUNTER — Other Ambulatory Visit: Payer: Self-pay

## 2019-12-06 ENCOUNTER — Emergency Department (HOSPITAL_COMMUNITY)
Admission: EM | Admit: 2019-12-06 | Discharge: 2019-12-06 | Disposition: A | Discharge status: Home / Self Care | Payer: Self-pay | Attending: Emergency Medicine | Admitting: Emergency Medicine

## 2019-12-06 DIAGNOSIS — I10 Essential (primary) hypertension: Secondary | ICD-10-CM | POA: Insufficient documentation

## 2019-12-06 DIAGNOSIS — Z20822 Contact with and (suspected) exposure to covid-19: Secondary | ICD-10-CM | POA: Insufficient documentation

## 2019-12-06 DIAGNOSIS — R059 Cough, unspecified: Secondary | ICD-10-CM

## 2019-12-06 DIAGNOSIS — R0982 Postnasal drip: Secondary | ICD-10-CM | POA: Insufficient documentation

## 2019-12-06 DIAGNOSIS — R05 Cough: Secondary | ICD-10-CM | POA: Insufficient documentation

## 2019-12-06 DIAGNOSIS — Z79899 Other long term (current) drug therapy: Secondary | ICD-10-CM | POA: Insufficient documentation

## 2019-12-06 DIAGNOSIS — F17210 Nicotine dependence, cigarettes, uncomplicated: Secondary | ICD-10-CM | POA: Insufficient documentation

## 2019-12-06 DIAGNOSIS — J301 Allergic rhinitis due to pollen: Secondary | ICD-10-CM | POA: Insufficient documentation

## 2019-12-06 DIAGNOSIS — T7840XA Allergy, unspecified, initial encounter: Secondary | ICD-10-CM

## 2019-12-06 LAB — SARS CORONAVIRUS 2 (TAT 6-24 HRS): SARS Coronavirus 2: NEGATIVE

## 2019-12-06 MED ORDER — CETIRIZINE HCL 10 MG PO TABS: 10.0000 mg | ORAL_TABLET | Freq: Every day | ORAL | 3 refills | Status: DC

## 2019-12-06 MED ORDER — FLUTICASONE PROPIONATE 50 MCG/ACT NA SUSP: 1.0000 | Freq: Every day | NASAL | 2 refills | Status: DC

## 2019-12-06 MED ORDER — BENZONATATE 100 MG PO CAPS: 100.0000 mg | ORAL_CAPSULE | Freq: Three times a day (TID) | ORAL | 0 refills | Status: DC

## 2019-12-06 NOTE — ED Triage Notes (Signed)
Pt here for evaluation of three weeks of productive cough with white mucous and excessive sweating. Endorses headache d/t sneezing and coughing so much. Denies fevers at home. Has had first covid vaccine two weeks ago. Denies shob.

## 2019-12-06 NOTE — ED Provider Notes (Signed)
Patrick Gilbert   CSN: [id number] Arrival date & time: 12/06/19  0747     History Chief Complaint  Patient presents with  . Cough  . Excessive Sweating    Patrick Gilbert is a 64 y.o. male with a past medical history significant for hypertension and hyperlipidemia who presents to the ED due to gradual onset of worsening productive cough with white phlegm x3 weeks associated with postnasal drip, rhinorrhea, and sneezing.  Patient notes similar symptoms occur yearly around this time she believes is attributed to allergies.  He has tried over-the-counter allergy and sinus medication without relief.  He also admits to night sweats, but denies weight loss.  Denies sick contacts and Covid exposures.  He received his first Covid vaccine 2 weeks ago.  Denies shortness of breath, chest pain, abdominal pain, nausea, vomiting, diarrhea, sore throat.  Admits to smoking a half a pack of cigarettes a day.  Denies history of asthma.  No aggravating or relieving factors.  Denies fever and chills.  History obtained from patient and past medical records. No interpreter used during encounter.      Past Medical History:  Diagnosis Date  . Headache 03/2016  . High blood pressure   . High cholesterol     Patient Active Problem List   Diagnosis Date Noted  . Left leg pain 07/27/2018  . Numbness and tingling in left hand 07/27/2018  . Essential hypertension 07/27/2018  . Hyperlipidemia 07/27/2018  . Abnormal brain MRI   . Chest pain 03/27/2016  . AKI (acute kidney injury) (HCC) 03/27/2016  . Headache 03/27/2016  . Tobacco abuse 03/27/2016  . Abnormal EKG   . Cephalalgia   . Pain in the chest     Past Surgical History:  Procedure Laterality Date  . Repair of left shoulder laceration     cut with broken glass bottle.  Missed a major artery by just a small measurement per patient       Family History  Problem Relation Age of Onset  .  Hypertension Mother   . Heart disease Mother        unknown open heart surgery  . Hypertension Father   . Peripheral vascular disease Father   . Heart disease Brother        Unknown heart issue  . Seizures Daughter     Social History   Tobacco Use  . Smoking status: Current Every Day Smoker    Packs/day: 0.50    Years: 48.00    Pack years: 24.00    Types: Cigarettes  . Smokeless tobacco: Former Engineer, water Use Topics  . Alcohol use: No    Comment: History of drinking too much.  Stopped at age 18 yo  . Drug use: Yes    Frequency: 1.0 times per week    Types: Marijuana    Comment: Now and then; history of crack cocaine    Home Medications Prior to Admission medications   Medication Sig Start Date End Date Taking? Authorizing Provider  benzonatate (TESSALON) 100 MG capsule Take 1 capsule (100 mg total) by mouth every 8 (eight) hours. 12/06/19   Mannie Stabile, PA-C  cetirizine (ZYRTEC) 10 MG tablet Take 1 tablet (10 mg total) by mouth daily. 12/06/19   Mannie Stabile, PA-C  cyclobenzaprine (FLEXERIL) 10 MG tablet Take 1 tablet (10 mg total) by mouth at bedtime. 09/02/18   Elpidio Anis, PA-C  dicyclomine (BENTYL) 20 MG tablet Take 1  tablet (20 mg total) by mouth 2 (two) times daily. 04/22/19   Harris, Abigail, PA-C  fluticasone (FLONASE) 50 MCG/ACT nasal spray Place 1 spray into both nostrils daily. 12/06/19   Suzy Bouchard, PA-C  gabapentin (NEURONTIN) 100 MG capsule Take 1 capsule (100 mg total) by mouth 3 (three) times daily. 07/27/18   Mack Hook, MD  hydrochlorothiazide (HYDRODIURIL) 12.5 MG tablet Take 1 tablet (12.5 mg total) by mouth daily. 08/31/18   Mack Hook, MD  losartan (COZAAR) 50 MG tablet Take 1 tablet (50 mg total) by mouth daily. 08/31/18   Mack Hook, MD  losartan-hydrochlorothiazide [facility]) 50-12.5 MG tablet Take 1 tablet by mouth once daily 09/05/19   Mack Hook, MD  pantoprazole (PROTONIX) 20 MG tablet  Take 2 tablets (40 mg total) by mouth daily for 14 days. Patient not taking: Reported on 09/02/2018 08/02/18 08/16/18  Gareth Morgan, MD  psyllium (METAMUCIL SMOOTH TEXTURE) 58.6 % powder Mix 1 packet in 12 oz of water and drink it 3 times a day 04/22/19   Margarita Mail, PA-C  rosuvastatin (CRESTOR) 20 MG tablet 1/2 tab by mouth daily with evening meal 07/27/18   Mack Hook, MD    Allergies    Lipitor [atorvastatin calcium]  Review of Systems   Review of Systems  Constitutional: Negative for chills and fever.  HENT: Positive for postnasal drip, rhinorrhea and sneezing. Negative for congestion, ear pain, sinus pressure, sinus pain and sore throat.   Respiratory: Positive for cough. Negative for shortness of breath.   Cardiovascular: Negative for chest pain and leg swelling.  Gastrointestinal: Negative for abdominal pain, diarrhea, nausea and vomiting.  Genitourinary: Negative for dysuria.  All other systems reviewed and are negative.   Physical Exam Updated Vital Signs BP (!) 137/94 (BP Location: Left Arm)   Pulse 92   Temp 98.5 F (36.9 C) (Oral)   Resp 16   Ht 5\' 8"  (1.727 m)   Wt 81.6 kg   SpO2 99%   BMI 27.37 kg/m   Physical Exam Vitals and nursing Gilbert reviewed.  Constitutional:      General: He is not in acute distress.    Appearance: He is not ill-appearing.  HENT:     Head: Normocephalic.     Nose: Congestion present.     Mouth/Throat:     Comments: Posterior oropharynx clear and mucous membranes moist, there is mild erythema but no edema or tonsillar exudates, uvula midline, normal phonation, no trismus, tolerating secretions without difficulty. Eyes:     Pupils: Pupils are equal, round, and reactive to light.  Cardiovascular:     Rate and Rhythm: Normal rate and regular rhythm.     Pulses: Normal pulses.     Heart sounds: Normal heart sounds. No murmur. No friction rub. No gallop.   Pulmonary:     Effort: Pulmonary effort is normal.     Breath  sounds: Normal breath sounds.     Comments: Respirations equal and unlabored, patient able to speak in full sentences, lungs clear to auscultation bilaterally Abdominal:     General: Abdomen is flat. Bowel sounds are normal. There is no distension.     Palpations: Abdomen is soft.     Tenderness: There is no abdominal tenderness. There is no guarding or rebound.  Musculoskeletal:     Cervical back: Neck supple.     Comments: Able to move all 4 extremities without difficulty.  No lower extremity edema.  Skin:    General: Skin is warm and  dry.  Neurological:     General: No focal deficit present.     Mental Status: He is alert.  Psychiatric:        Mood and Affect: Mood normal.        Behavior: Behavior normal.     ED Results / Procedures / Treatments   Labs (all labs ordered are listed, but only abnormal results are displayed) Labs Reviewed  SARS CORONAVIRUS 2 (TAT 6-24 HRS)    EKG None  Radiology DG Chest Portable 1 View  Result Date: 12/06/2019 CLINICAL DATA:  Cough, 3 weeks of productive cough with headache. EXAM: PORTABLE CHEST 1 VIEW COMPARISON:  08/02/2018 FINDINGS: Cardiomediastinal contours are normal. Lungs are clear. No signs of pleural effusion or dense consolidation. Visualized skeletal structures are unremarkable. IMPRESSION: Normal chest. Electronically Signed   By: Donzetta Kohut M.D.   On: 12/06/2019 09:49    Procedures Procedures (including critical care time)  Medications Ordered in ED Medications - No data to display  ED Course  I have reviewed the triage vital signs and the nursing notes.  Pertinent labs & imaging results that were available during my care of the patient were reviewed by me and considered in my medical decision making (see chart for details).    MDM Rules/Calculators/A&P                     64 year old male presents to the ED due to productive cough with white phlegm, postnasal drip, sneezing, and rhinorrhea x3 weeks which  typically occurs yearly around this time.  Denies history of allergies.  Well, not tachycardic or hypoxic.  Patient no acute distress and non-ill-appearing.  Physical exam reassuring.  Lungs clear to auscultation bilaterally.  Will obtain chest x-ray to rule out pneumonia and PCR Covid test to rule out Covid.  Suspect symptoms are related to allergies given that they are recurrent yearly.  Chest x-ray personally reviewed which is negative for signs of pneumonia.  Patient able to ambulate in the ED and maintained O2 saturation above 94% the entire time without difficulty.  Will treat symptomatically for allergies with PCP follow-up within the next week.  Covid test pending.  Advised patient to self quarantine until Covid results become available.  Strict ED precautions discussed with patient. Patient states understanding and agrees to plan. Patient discharged home in no acute distress and stable vitals  Final Clinical Impression(s) / ED Diagnoses Final diagnoses:  Cough  Allergy, initial encounter    Rx / DC Orders ED Discharge Orders         Ordered    benzonatate (TESSALON) 100 MG capsule  Every 8 hours     12/06/19 1002    fluticasone (FLONASE) 50 MCG/ACT nasal spray  Daily     12/06/19 1002    cetirizine (ZYRTEC) 10 MG tablet  Daily     12/06/19 1002           Mannie Stabile, [place] 12/06/19 1004    Little, Ambrose Finland, MD 12/06/19 1118

## 2019-12-06 NOTE — ED Notes (Signed)
Patient walked to the bathroom did well back in room call bell in reach

## 2019-12-06 NOTE — Discharge Instructions (Signed)
As discussed, your chest x-ray was negative for signs of pneumonia.  I am sending home with cough medication, allergy medication, and nasal spray.  Take as prescribed.  If her symptoms do not improve within the next week, follow-up with PCP for further evaluation.  Return to the ER for new or worsening symptoms.

## 2019-12-06 NOTE — ED Notes (Signed)
Checked patient pulse oxy while walking patient did great stayed at 98 room air went down to 94 room air back up too 98 patient is now back in room with call bell in reach

## 2020-03-08 ENCOUNTER — Other Ambulatory Visit: Payer: Self-pay

## 2020-03-08 ENCOUNTER — Emergency Department (HOSPITAL_COMMUNITY)
Admission: EM | Admit: 2020-03-08 | Discharge: 2020-03-08 | Disposition: A | Discharge status: Home / Self Care | Payer: Self-pay | Attending: Emergency Medicine | Admitting: Emergency Medicine

## 2020-03-08 ENCOUNTER — Ambulatory Visit
Admission: EM | Admit: 2020-03-08 | Discharge: 2020-03-08 | Disposition: A | Discharge status: Home / Self Care | Payer: Self-pay | Attending: Emergency Medicine | Admitting: Emergency Medicine

## 2020-03-08 ENCOUNTER — Encounter (HOSPITAL_COMMUNITY): Payer: Self-pay | Admitting: Emergency Medicine

## 2020-03-08 DIAGNOSIS — K0889 Other specified disorders of teeth and supporting structures: Secondary | ICD-10-CM | POA: Insufficient documentation

## 2020-03-08 DIAGNOSIS — Z5321 Procedure and treatment not carried out due to patient leaving prior to being seen by health care provider: Secondary | ICD-10-CM | POA: Insufficient documentation

## 2020-03-08 MED ORDER — DICLOFENAC SODIUM 75 MG PO TBEC: 75.0000 mg | DELAYED_RELEASE_TABLET | Freq: Two times a day (BID) | ORAL | 0 refills | Status: AC

## 2020-03-08 NOTE — ED Triage Notes (Signed)
Pt c/o lower teeth pain since Monday, now having swelling to rt lower gum area. States went to Dental Work on Monday, gave him antibiotics but no pain meds. States they don't take his insurance.

## 2020-03-08 NOTE — ED Provider Notes (Signed)
[facility]    CSN: [id number] Arrival date & time: 03/08/20  0816      History   Chief Complaint Chief Complaint  Patient presents with  . Dental Pain    HPI Patrick Gilbert is a 64 y.o. male with history of headaches, hypertension presenting for dental pain.  Patient states that he consulted with a dentist Monday who prescribed him an antibiotic that he takes 3 times daily: Reports compliance with this, though no pain medications.  Patient states he needs to have several teeth extracted, though cannot afford at this time.  Denies throat pain, difficulty breathing or swallowing, headaches, ear pain.   Past Medical History:  Diagnosis Date  . Headache 03/2016  . High blood pressure   . High cholesterol     Patient Active Problem List   Diagnosis Date Noted  . Left leg pain 07/27/2018  . Numbness and tingling in left hand 07/27/2018  . Essential hypertension 07/27/2018  . Hyperlipidemia 07/27/2018  . Abnormal brain MRI   . Chest pain 03/27/2016  . AKI (acute kidney injury) (HCC) 03/27/2016  . Headache 03/27/2016  . Tobacco abuse 03/27/2016  . Abnormal EKG   . Cephalalgia   . Pain in the chest     Past Surgical History:  Procedure Laterality Date  . Repair of left shoulder laceration     cut with broken glass bottle.  Missed a major artery by just a small measurement per patient       Home Medications    Prior to Admission medications   Medication Sig Start Date End Date Taking? Authorizing Provider  diclofenac (VOLTAREN) 75 MG EC tablet Take 1 tablet (75 mg total) by mouth 2 (two) times daily for 10 days. 03/08/20 03/18/20  Hall-Potvin, [place], PA-C  gabapentin (NEURONTIN) 100 MG capsule Take 1 capsule (100 mg total) by mouth 3 (three) times daily. 07/27/18   Julieanne Manson, MD  hydrochlorothiazide (HYDRODIURIL) 12.5 MG tablet Take 1 tablet (12.5 mg total) by mouth daily. 08/31/18   Julieanne Manson, MD  losartan (COZAAR) 50 MG tablet Take 1  tablet (50 mg total) by mouth daily. 08/31/18   Julieanne Manson, MD  losartan-hydrochlorothiazide [facility]) 50-12.5 MG tablet Take 1 tablet by mouth once daily 09/05/19   Julieanne Manson, MD  psyllium (METAMUCIL SMOOTH TEXTURE) 58.6 % powder Mix 1 packet in 12 oz of water and drink it 3 times a day 04/22/19   Arthor Captain, PA-C  rosuvastatin (CRESTOR) 20 MG tablet 1/2 tab by mouth daily with evening meal 07/27/18   Julieanne Manson, MD    Family History Family History  Problem Relation Age of Onset  . Hypertension Mother   . Heart disease Mother        unknown open heart surgery  . Hypertension Father   . Peripheral vascular disease Father   . Heart disease Brother        Unknown heart issue  . Seizures Daughter     Social History Social History   Tobacco Use  . Smoking status: Current Every Day Smoker    Packs/day: 0.50    Years: 48.00    Pack years: 24.00    Types: Cigarettes  . Smokeless tobacco: Former Clinical biochemist  . Vaping Use: Never used  Substance Use Topics  . Alcohol use: No    Comment: History of drinking too much.  Stopped at age 43 yo  . Drug use: Yes    Frequency: 1.0 times per week  Types: Marijuana    Comment: Now and then; history of crack cocaine     Allergies   Lipitor [atorvastatin calcium]   Review of Systems As per HPI   Physical Exam Triage Vital Signs ED Triage Vitals  Enc Vitals Group     BP      Pulse      Resp      Temp      Temp src      SpO2      Weight      Height      Head Circumference      Peak Flow      Pain Score      Pain Loc      Pain Edu?      Excl. in GC?    No data found.  Updated Vital Signs BP 134/85 (BP Location: Left Arm)   Pulse 79   Temp 98.2 F (36.8 C) (Oral)   Resp 18   SpO2 98%   Visual Acuity Right Eye Distance:   Left Eye Distance:   Bilateral Distance:    Right Eye Near:   Left Eye Near:    Bilateral Near:     Physical Exam Constitutional:      General: He is  not in acute distress. HENT:     Head: Normocephalic and atraumatic.     Mouth/Throat:     Mouth: Mucous membranes are moist.     Pharynx: Oropharynx is clear.     Comments: Poor dentition with numerous teeth missing and significant dental decay.  No gingival swelling, mass, discharge or erythema Eyes:     General: No scleral icterus.    Pupils: Pupils are equal, round, and reactive to light.  Cardiovascular:     Rate and Rhythm: Normal rate.  Pulmonary:     Effort: Pulmonary effort is normal. No respiratory distress.     Breath sounds: No wheezing.  Skin:    Coloration: Skin is not jaundiced or pale.  Neurological:     Mental Status: He is alert and oriented to person, place, and time.      UC Treatments / Results  Labs (all labs ordered are listed, but only abnormal results are displayed) Labs Reviewed - No data to display  EKG   Radiology No results found.  Procedures Procedures (including critical care time)  Medications Ordered in UC Medications - No data to display  Initial Impression / Assessment and Plan / UC Course  I have reviewed the triage vital signs and the nursing notes.  Pertinent labs & imaging results that were available during my care of the patient were reviewed by me and considered in my medical decision making (see chart for details).     Patient febrile, nontoxic in office today.  Patient reportedly on antibiotic 3 times daily, which he started yesterday.  Discussed importance of continuing antibiotic.  Will provide anti-inflammatory as well has low cost community dental resources for follow-up.  Return precautions discussed, patient verbalized understanding and is agreeable to plan. Final Clinical Impressions(s) / UC Diagnoses   Final diagnoses:  Pain, dental     Discharge Instructions     Low-Cost Community Dental Resources:  [facility] Address: [address], [place], [place], [address] Phone:  [phone]  - Dr. Lawrence Marseilles Address: [address] [place], [place], [address] Phone: [phone]    ED Prescriptions    Medication Sig Dispense Auth. Provider   diclofenac (VOLTAREN) 75 MG  EC tablet Take 1 tablet (75 mg total) by mouth 2 (two) times daily for 10 days. 20 tablet Hall-Potvin, [place], PA-C     I have reviewed the PDMP during this encounter.   Hall-Potvin, [place], [place] 03/08/20 [phone]

## 2020-03-08 NOTE — ED Notes (Signed)
Pt stated he was going to [employer] because he was not waiting here all day

## 2020-03-08 NOTE — Discharge Instructions (Signed)
Low-Cost Community Dental Resources:  [facility] Address: [address], [place], [place], [address] Phone: [phone]  - Dr. Civils Address: [address], [place], [place], [address] Phone: [phone] 

## 2020-03-08 NOTE — ED Triage Notes (Signed)
Patient reports worsening bilateral lower dental pain onset last week unrelieved by OTC pain medications .

## 2020-03-10 ENCOUNTER — Emergency Department (HOSPITAL_COMMUNITY)
Admission: EM | Admit: 2020-03-10 | Discharge: 2020-03-10 | Disposition: A | Discharge status: Home / Self Care | Payer: Self-pay | Attending: Emergency Medicine | Admitting: Emergency Medicine

## 2020-03-10 ENCOUNTER — Other Ambulatory Visit: Payer: Self-pay

## 2020-03-10 DIAGNOSIS — K0889 Other specified disorders of teeth and supporting structures: Secondary | ICD-10-CM | POA: Insufficient documentation

## 2020-03-10 DIAGNOSIS — K029 Dental caries, unspecified: Secondary | ICD-10-CM | POA: Insufficient documentation

## 2020-03-10 DIAGNOSIS — F17210 Nicotine dependence, cigarettes, uncomplicated: Secondary | ICD-10-CM | POA: Insufficient documentation

## 2020-03-10 DIAGNOSIS — F1290 Cannabis use, unspecified, uncomplicated: Secondary | ICD-10-CM | POA: Insufficient documentation

## 2020-03-10 DIAGNOSIS — I10 Essential (primary) hypertension: Secondary | ICD-10-CM | POA: Insufficient documentation

## 2020-03-10 DIAGNOSIS — Z79899 Other long term (current) drug therapy: Secondary | ICD-10-CM | POA: Insufficient documentation

## 2020-03-10 NOTE — Discharge Instructions (Addendum)
You have been seen here for a dental cavity.  I recommend that you continue to take your antibiotics as well as your pain medicine as prescribed.  Please try to limit your consumption of sugar products like soda, candy, ice cream and other sweets.  You can apply heat to your face which can help with pain as well as inflammation.  I have given you resources for dentists in the area that can provide some assistance.  Please follow-up with the dentist that you have already seen or reach out to one of the dentist that provided to you.  I have also given you contact information for community health and wellness they work with individuals of little to no insurance please call at your earliest convenience to find a primary care provider.  I want you to come back to the emergency department if you develop fever, chills, inability to swallow, swelling around your throat, change in your voice, inability to eat, chest pain, shortness of breath uncontrolled nausea, vomiting vomiting as the symptoms require further evaluation.

## 2020-03-10 NOTE — ED Triage Notes (Signed)
Patient reports right sided dental pain. States he went to dentist to get tooth removed, but has to take antibiotics first due to abscess. Patient says he is unable to take antibiotic because he cannot eat due to pain. Pain rated 10/10

## 2020-03-10 NOTE — ED Provider Notes (Signed)
[facility]-EMERGENCY DEPT Provider Note   CSN: [id number] Arrival date & time: 03/10/20  1013     History Chief Complaint  Patient presents with  . Dental Pain    Patrick Gilbert is a 64 y.o. male.  HPI   Patient presents to the emergency department with chief complaint of dental pain.  Patient states he was seen at his dentist on Monday who prescribed him antibiotics and told to follow-up after he finished his antibiotics.  He was also seen at urgent care 2 days ago for similar complaint and was prescribed anti-inflammatories for pain.  Patient comes here today because he is complaining of gum pain, admits that the pain has improved since taking his antibiotics, able to open up his mouth more without pain, is eating and drinking without difficulty.  He denies any fever or chills, denies any difficulty swallowing, denies change in voice, denies any swelling around his neck.  Has significant history of high blood pressure and cholesterol.  He denies fever, chills,  difficulty swallowing, throat pain,shortness of breath, chest pain, nausea, vomiting, pedal edema.  Past Medical History:  Diagnosis Date  . Headache 03/2016  . High blood pressure   . High cholesterol     Patient Active Problem List   Diagnosis Date Noted  . Left leg pain 07/27/2018  . Numbness and tingling in left hand 07/27/2018  . Essential hypertension 07/27/2018  . Hyperlipidemia 07/27/2018  . Abnormal brain MRI   . Chest pain 03/27/2016  . AKI (acute kidney injury) (HCC) 03/27/2016  . Headache 03/27/2016  . Tobacco abuse 03/27/2016  . Abnormal EKG   . Cephalalgia   . Pain in the chest     Past Surgical History:  Procedure Laterality Date  . Repair of left shoulder laceration     cut with broken glass bottle.  Missed a major artery by just a small measurement per patient       Family History  Problem Relation Age of Onset  . Hypertension Mother   . Heart disease Mother         unknown open heart surgery  . Hypertension Father   . Peripheral vascular disease Father   . Heart disease Brother        Unknown heart issue  . Seizures Daughter     Social History   Tobacco Use  . Smoking status: Current Every Day Smoker    Packs/day: 0.50    Years: 48.00    Pack years: 24.00    Types: Cigarettes  . Smokeless tobacco: Former Clinical biochemist  . Vaping Use: Never used  Substance Use Topics  . Alcohol use: No    Comment: History of drinking too much.  Stopped at age 57 yo  . Drug use: Yes    Frequency: 1.0 times per week    Types: Marijuana    Comment: Now and then; history of crack cocaine    Home Medications Prior to Admission medications   Medication Sig Start Date End Date Taking? Authorizing Provider  diclofenac (VOLTAREN) 75 MG EC tablet Take 1 tablet (75 mg total) by mouth 2 (two) times daily for 10 days. 03/08/20 03/18/20  Hall-Potvin, [place], PA-C  gabapentin (NEURONTIN) 100 MG capsule Take 1 capsule (100 mg total) by mouth 3 (three) times daily. 07/27/18   Julieanne Manson, MD  hydrochlorothiazide (HYDRODIURIL) 12.5 MG tablet Take 1 tablet (12.5 mg total) by mouth daily. 08/31/18   Julieanne Manson, MD  losartan (COZAAR)  50 MG tablet Take 1 tablet (50 mg total) by mouth daily. 08/31/18   Julieanne Manson, MD  losartan-hydrochlorothiazide [facility]) 50-12.5 MG tablet Take 1 tablet by mouth once daily 09/05/19   Julieanne Manson, MD  psyllium (METAMUCIL SMOOTH TEXTURE) 58.6 % powder Mix 1 packet in 12 oz of water and drink it 3 times a day 04/22/19   Arthor Captain, PA-C  rosuvastatin (CRESTOR) 20 MG tablet 1/2 tab by mouth daily with evening meal 07/27/18   Julieanne Manson, MD    Allergies    Lipitor [atorvastatin calcium]  Review of Systems   Review of Systems  Constitutional: Negative for chills and fever.  HENT: Positive for dental problem. Negative for congestion, ear discharge, ear pain, facial swelling, mouth sores, sore  throat, tinnitus and trouble swallowing.   Eyes: Negative for visual disturbance.  Respiratory: Negative for cough and shortness of breath.   Cardiovascular: Negative for chest pain.  Gastrointestinal: Negative for abdominal pain, diarrhea, nausea and vomiting.  Genitourinary: Negative for enuresis and scrotal swelling.  Musculoskeletal: Negative for back pain.  Skin: Negative for rash.  Neurological: Negative for dizziness and light-headedness.  Hematological: Does not bruise/bleed easily.    Physical Exam Updated Vital Signs BP 125/88 (BP Location: Left Arm)   Pulse 77   Temp 98.5 F (36.9 C) (Oral)   Resp 18   Ht 5\' 8"  (1.727 m)   Wt 97.5 kg   SpO2 99%   BMI 32.69 kg/m   Physical Exam Vitals and nursing note reviewed.  Constitutional:      General: He is not in acute distress.    Appearance: He is not ill-appearing.  HENT:     Head: Normocephalic and atraumatic.     Nose: No congestion.     Mouth/Throat:     Mouth: Mucous membranes are moist.     Pharynx: Oropharynx is clear. No oropharyngeal exudate or posterior oropharyngeal erythema.     Comments: Oral cavity was visualized, uvula was midline, tongue was midline, patient has most of his teeth extracted, his two front bottoms have extensive cavities.  Gums were visualized, no signs of gingivitis, no erythema, no signs of an abscess.  Under the tongue was palpated no swelling or induration noted.  Patient was controlling his secretions without difficulty. Eyes:     General: No scleral icterus. Cardiovascular:     Rate and Rhythm: Normal rate and regular rhythm.     Pulses: Normal pulses.     Heart sounds: No murmur heard.  No friction rub. No gallop.   Pulmonary:     Effort: No respiratory distress.     Breath sounds: No wheezing, rhonchi or rales.  Abdominal:     General: There is no distension.     Tenderness: There is no abdominal tenderness. There is no guarding.  Musculoskeletal:        General: No swelling  or tenderness.  Skin:    General: Skin is warm and dry.     Capillary Refill: Capillary refill takes less than 2 seconds.     Findings: No rash.  Neurological:     Mental Status: He is alert.  Psychiatric:        Mood and Affect: Mood normal.     ED Results / Procedures / Treatments   Labs (all labs ordered are listed, but only abnormal results are displayed) Labs Reviewed - No data to display  EKG None  Radiology No results found.  Procedures Procedures (including critical care time)  Medications Ordered in ED Medications - No data to display  ED Course  I have reviewed the triage vital signs and the nursing notes.  Pertinent labs & imaging results that were available during my care of the patient were reviewed by me and considered in my medical decision making (see chart for details).    MDM Rules/Calculators/A&P                          I have personally reviewed all imaging, labs and have interpreted them.  Due to patient's complaint most concern for dental abscess versus [place] angina versus peritonsillar abscess.  Unlikely  patient is suffering from a peritonsillar abscess as throat was visualized, no erythema or exudate noted, uvula was midline, patient was controlling secretions without difficulty.  Denies any throat pain or difficulty swallowing.  Unlikely patient suffering from dental abscess as gums are visualized, no erythema noted, palpated no abscess felt.  [place] angina unlikely as patient has no swelling on exam, no difficulty swallowing.  Labs and imaging will be deferred as vitals are reassuring, nontachycardic, nontachypneic, afebrile, patient was nontoxic-appearing.   Patient appears to be resting comfortably, showing no acute signs distress.  Patient is tolerating p.o.  vital signs have remained stable does not meet criteria to be admitted to the hospital.  Likely patient's pain is result of a dental cavity, recommend that he continues to take his  antibiotics and pain meds as prescribed and follow-up with his dentist for reevaluation.  Patient was discussed with attending who agrees assessment plan.  Patient was given at home instruction as well as strict return precautions.  Patient verbalized that he understood and agrees with said plan. Final Clinical Impression(s) / ED Diagnoses Final diagnoses:  Pain, dental  Dental caries    Rx / DC Orders ED Discharge Orders    None       Marcello Fennel, PA-C 03/10/20 1444    Daleen Bo, MD 03/10/20 2023

## 2020-04-13 ENCOUNTER — Ambulatory Visit
Admission: RE | Admit: 2020-04-13 | Discharge: 2020-04-13 | Disposition: A | Discharge status: Home / Self Care | Payer: Self-pay | Source: Ambulatory Visit | Attending: Family Medicine | Admitting: Family Medicine

## 2020-04-13 ENCOUNTER — Other Ambulatory Visit: Payer: Self-pay | Admitting: Family Medicine

## 2020-04-13 DIAGNOSIS — R1030 Lower abdominal pain, unspecified: Secondary | ICD-10-CM

## 2020-10-04 ENCOUNTER — Ambulatory Visit: Payer: 59 | Admitting: Neurology

## 2020-10-25 ENCOUNTER — Telehealth: Payer: Self-pay | Admitting: Neurology

## 2020-10-25 ENCOUNTER — Ambulatory Visit (INDEPENDENT_AMBULATORY_CARE_PROVIDER_SITE_OTHER): Payer: Medicare Other | Admitting: Neurology

## 2020-10-25 ENCOUNTER — Encounter: Payer: Self-pay | Admitting: Neurology

## 2020-10-25 VITALS — BP 124/79 | HR 86 | Ht 66.0 in | Wt 205.5 lb

## 2020-10-25 DIAGNOSIS — M4807 Spinal stenosis, lumbosacral region: Secondary | ICD-10-CM

## 2020-10-25 DIAGNOSIS — M5412 Radiculopathy, cervical region: Secondary | ICD-10-CM

## 2020-10-25 DIAGNOSIS — R200 Anesthesia of skin: Secondary | ICD-10-CM

## 2020-10-25 MED ORDER — TIZANIDINE HCL 2 MG PO TABS: ORAL_TABLET | ORAL | 5 refills | Status: DC

## 2020-10-25 MED ORDER — GABAPENTIN 300 MG PO CAPS: 300.0000 mg | ORAL_CAPSULE | Freq: Three times a day (TID) | ORAL | 11 refills | Status: DC

## 2020-10-25 NOTE — Telephone Encounter (Signed)
[employer] medicare order sent to GI. No auth they will reach out to the patient to schedule.  

## 2020-10-25 NOTE — Progress Notes (Signed)
[facility]  PATIENT: Patrick Gilbert DOB: [birth year]  REFERRING DOCTOR OR PCP:   Norva RiffleConni Elliotshley Vanstory PA SOURCE: Patient, notes from primary care  _________________________________   HISTORICAL  CHIEF COMPLAINT:  Chief Complaint  Patient presents with  . New Patient (Initial Visit)    RM 12. Paper referral for left leg numbness.    HISTORY OF PRESENT ILLNESS:  I [facility] the [facility] Gilbert[facility] regarding his left leg numbness.  He is a 65 year old man who has had many years of left leg numbness.   Symptoms started in the arms a couple years again.  He also notes pain in the flank and armpit regio.   When he went to the ED about the left arm problem in the past, he had a cardiac workup but no evaluation of the arm.    Currently, he has a pressure sensation near the axilla and numbness in the second through 5th digit.  He notes no left arm weakness.   He notes some back discomfort.   Marland Kitchen.He denies foot numbness.    No leg weakness.   Walking longer distances increases the back pain.  Arms and leg bothers him about the same.     He notes he has a mild limp.   He was placed on gabapentin 100 mg tid and tizanidine 2 mg tid prn with only mild benefit.   Gait is fine.   He uses the bannister at times with stairs but sometimes does not.    He denies bladder changes.  Abd/Pelvic CT scan shows mild degenerative changes at L3-L4 and L4-L5.  There appears to be mild spinal stenosis at L3-L4.  REVIEW OF SYSTEMS: Constitutional: No fevers, chills, sweats, or change in appetite Eyes: No visual changes, double vision, eye pain Ear, nose and throat: No hearing loss, ear pain, nasal congestion, sore throat Cardiovascular: No chest pain, palpitations Respiratory: No shortness of breath at rest or with exertion.   No wheezes GastrointestinaI: No nausea, vomiting, diarrhea, abdominal pain, fecal  incontinence Genitourinary: No dysuria, urinary retention or frequency.  No nocturia. Musculoskeletal:As above integumentary: No rash, pruritus, skin lesions Neurological: as above Psychiatric: No depression at this time.  No anxiety Endocrine: No palpitations, diaphoresis, change in appetite, change in weigh or increased thirst Hematologic/Lymphatic: No anemia, purpura, petechiae. Allergic/Immunologic: No itchy/runny eyes, nasal congestion, recent allergic reactions, rashes  ALLERGIES: Allergies  Allergen Reactions  . Lipitor [Atorvastatin Calcium] Other (See Comments)    Muscle pain, which he does not have with Rosuvastatin.    HOME MEDICATIONS:  Current Outpatient Medications:  .  losartan-hydrochlorothiazide (HYZAAR) 50-12.5 MG tablet, Take 1 tablet by mouth once daily, Disp: 60 tablet, Rfl: 0 .  tiZANidine (ZANAFLEX) 2 MG tablet, Take by mouth every 8 (eight) hours as needed for muscle spasms., Disp: , Rfl:  .  gabapentin (NEURONTIN) 100 MG capsule, Take 1 capsule (100 mg total) by mouth 3 (three) times daily. (Patient not taking: Reported on 10/25/2020), Disp: 90 capsule, Rfl: 11 .  psyllium (METAMUCIL SMOOTH TEXTURE) 58.6 % powder, Mix 1 packet in 12 oz of water and drink it 3 times a day (Patient not taking: Reported on 10/25/2020), Disp: 283 g, Rfl: 0 .  rosuvastatin (CRESTOR) 20 MG tablet, 1/2 tab by mouth daily with evening meal (Patient not taking: Reported on 10/25/2020), Disp: 30 tablet, Rfl: 6  PAST MEDICAL HISTORY: Past Medical History:  Diagnosis Date  . Headache  03/2016  . High blood pressure   . High cholesterol     PAST SURGICAL HISTORY: Past Surgical History:  Procedure Laterality Date  . Repair of left shoulder laceration     cut with broken glass bottle.  Missed a major artery by just a small measurement per patient    FAMILY HISTORY: Family History  Problem Relation Age of Onset  . Hypertension Mother   . Heart disease Mother        unknown open  heart surgery  . Hypertension Father   . Peripheral vascular disease Father   . Heart disease Brother        Unknown heart issue  . Seizures Daughter     SOCIAL HISTORY:  Social History   Socioeconomic History  . Marital status: Legally Separated    Spouse name: Not on file  . Number of children: 2  . Years of education: 47  . Highest education level: 11th grade  Occupational History  . Occupation: Estate manager/land agent job  Tobacco Use  . Smoking status: Current Every Day Smoker    Packs/day: 0.50    Years: 48.00    Pack years: 24.00    Types: Cigarettes  . Smokeless tobacco: Former Clinical biochemist  . Vaping Use: Never used  Substance and Sexual Activity  . Alcohol use: No    Comment: History of drinking too much.  Stopped at age 46 yo  . Drug use: Not Currently    Frequency: 1.0 times per week    Types: Marijuana    Comment: Now and then; history of crack cocaine  . Sexual activity: Not on file  Other Topics Concern  . Not on file  Social History Narrative   Lives alone in an apartment on the 10th floor.  Has 3 children but one is deceased.  Works at an Engineer, maintenance.  Education: 11th grade.   Coffee sometimes and [place] Dew   Social Determinants of Health   Financial Resource Strain: Not on file  Food Insecurity: Not on file  Transportation Needs: Not on file  Physical Activity: Not on file  Stress: Not on file  Social Connections: Not on file  Intimate Partner Violence: Not on file     PHYSICAL EXAM  Vitals:   10/25/20 1007  BP: 124/79  Pulse: 86  Weight: 205 lb 8 oz (93.2 kg)  Height: 5\' 6"  (1.676 m)    Body mass index is 33.17 kg/m.   General: The patient is well-developed and well-nourished and in no acute distress  HEENT:  Head is [place]/AT.  Sclera are anicteric.     Neck: No carotid bruits are noted.  The neck is nontender.  Cardiovascular: The heart has a regular rate and rhythm with a normal S1 and S2. There were no murmurs, gallops or rubs.     Skin: Extremities are without rash or  edema.  Musculoskeletal:  Back is nontender  Neurologic Exam  Mental status: The patient is alert and oriented x 3 at the time of the examination. The patient has apparent normal recent and remote memory, with an apparently normal attention span and concentration ability.   Speech is normal.  Cranial nerves: Extraocular movements are full.  Facial symmetry is present. There is good facial sensation to soft touch bilaterally.Facial strength is normal.  Trapezius and sternocleidomastoid strength is normal. No dysarthria is noted.    No obvious hearing deficits are noted.  Motor:  Muscle bulk is normal.   Tone is  normal. Strength is  5 / 5 in all 4 extremities.   Sensory: Sensory testing is intact to pinprick, soft touch and vibration sensation in the right arm and leg.  He reported mildly reduced sensation to touch in the C7 distribution of the left hand.  Some reduced sensation near the ankle on the left to touch, symmetric sensation to vibration.  Coordination: Cerebellar testing reveals good finger-nose-finger and heel-to-shin bilaterally.  Gait and station: Station is normal.   Gait is arthritic. Tandem gait is slightly wide. Romberg is negative.   Reflexes: Deep tendon reflexes are symmetric and normal bilaterally.   Plantar responses are flexor.    DIAGNOSTIC DATA (LABS, IMAGING, TESTING) - I reviewed patient records, labs, notes, testing and imaging myself where available.  Lab Results  Component Value Date   WBC 6.0 04/22/2019   HGB 15.0 04/22/2019   HCT 44.6 04/22/2019   MCV 88.1 04/22/2019   PLT 195 04/22/2019      Component Value Date/Time   NA 137 04/22/2019 1252   NA 139 08/10/2018 1103   K 3.6 04/22/2019 1252   CL 101 04/22/2019 1252   CO2 26 04/22/2019 1252   GLUCOSE 93 04/22/2019 1252   BUN 9 04/22/2019 1252   BUN 11 08/10/2018 1103   CREATININE 1.04 04/22/2019 1252   CALCIUM 9.6 04/22/2019 1252   PROT 6.3 (L)  04/22/2019 1252   PROT 7.0 10/11/2018 0850   ALBUMIN 3.9 04/22/2019 1252   ALBUMIN 4.8 10/11/2018 0850   AST 25 04/22/2019 1252   ALT 38 04/22/2019 1252   ALKPHOS 38 04/22/2019 1252   BILITOT 0.6 04/22/2019 1252   BILITOT 0.6 10/11/2018 0850   GFRNONAA >60 04/22/2019 1252   GFRAA >60 04/22/2019 1252   Lab Results  Component Value Date   CHOL 143 10/11/2018   HDL 44 10/11/2018   LDLCALC 77 10/11/2018   TRIG 110 10/11/2018   CHOLHDL 5.3 03/28/2016   Lab Results  Component Value Date   HGBA1C 5.9 (H) 03/27/2016   No results found for: VITAMINB12 Lab Results  Component Value Date   TSH 0.996 03/27/2016       ASSESSMENT AND PLAN  Spinal stenosis of lumbosacral region - Plan: MR LUMBAR SPINE WO CONTRAST  Numbness - Plan: MR CERVICAL SPINE WO CONTRAST  Cervical radiculopathy - Plan: MR CERVICAL SPINE WO CONTRAST   In summary, Mr. Eimers is a 65 year old man with numbness and pain in the left arm and left leg.  He did have some sensory changes that could be consistent with C7 radiculopathy.  Symptoms in the arm and leg have persisted despite medications for the past couple months.  A CT scan of the abdomen showed what appears to be mild spinal stenosis at L3-L4.  We will check an MRI of the lumbar spine to evaluate for spinal stenosis and lumbar radiculopathy.  We will check MRI of the cervical spine for cervical radiculopathy, possibly at C7.  To help with the symptoms I am placing him on a higher dose of gabapentin 300 mg p.o. 3 times daily and also he can restart the tizanidine.  He is advised to stay active and exercise as tolerated.  He will return to see [place] in couple months or sooner if there are new or worsening neurologic symptoms or based on the results of the MRI.  Thank you for asking me to see Mr. Geiman.  Please let me know can be of further assistance with him or other patients in  the future.   Khloie Hamada A. Epimenio Foot, MD, [facility] 10/25/2020, 10:13 AM Certified in  Neurology, Clinical Neurophysiology, Sleep Medicine and Neuroimaging  [facility] [address], [address] [place], [place] [address] [phone]

## 2020-10-28 ENCOUNTER — Ambulatory Visit
Admission: RE | Admit: 2020-10-28 | Discharge: 2020-10-28 | Disposition: A | Discharge status: Home / Self Care | Payer: Medicare Other | Source: Ambulatory Visit | Attending: Neurology | Admitting: Neurology

## 2020-10-28 DIAGNOSIS — R200 Anesthesia of skin: Secondary | ICD-10-CM | POA: Diagnosis not present

## 2020-10-28 DIAGNOSIS — M5412 Radiculopathy, cervical region: Secondary | ICD-10-CM | POA: Diagnosis not present

## 2020-10-28 DIAGNOSIS — M4807 Spinal stenosis, lumbosacral region: Secondary | ICD-10-CM | POA: Diagnosis not present

## 2020-10-31 ENCOUNTER — Telehealth: Payer: Self-pay | Admitting: Neurology

## 2020-10-31 DIAGNOSIS — R200 Anesthesia of skin: Secondary | ICD-10-CM

## 2020-10-31 NOTE — Telephone Encounter (Signed)
Of note, he had had an abnormal brain MRI in the past that showed multiple lesions including some of the periventricular white matter.   I discussed with him that MS is a possible diagnosis and that it is more likely if additional lesions are seen

## 2020-10-31 NOTE — Addendum Note (Signed)
Addended by: Asa Lente on: 10/31/2020 06:02 PM   Modules accepted: Orders

## 2020-10-31 NOTE — Telephone Encounter (Signed)
I spoke to Patrick Gilbert about the MRI results.  He has numbness from the left axilla and adjacent chest.  He has back pain going to the left hip.  MRI of the lumbar spine shows some degenerative changes maximal at L3-L4 Rebif could be impingement upon the left L4 nerve root  The MRI of the cervical spine show degenerative changes.  There was a subtle T2 hyperintense focus posteriorly within the spinal cord at C5-C6.  Marland Kitchen  Because of that focus, and the symptoms on the left I think we should go ahead and check an MRI of the brain to further evaluate.

## 2020-11-01 ENCOUNTER — Telehealth: Payer: Self-pay | Admitting: Neurology

## 2020-11-01 NOTE — Telephone Encounter (Signed)
[employer] medicare order sent to GI. No auth they will reach out to the patient to schedule.  

## 2020-11-11 ENCOUNTER — Other Ambulatory Visit: Payer: Self-pay

## 2020-11-15 ENCOUNTER — Ambulatory Visit
Admission: RE | Admit: 2020-11-15 | Discharge: 2020-11-15 | Disposition: A | Discharge status: Home / Self Care | Payer: Medicare Other | Source: Ambulatory Visit | Attending: Neurology | Admitting: Neurology

## 2020-11-15 ENCOUNTER — Other Ambulatory Visit: Payer: Self-pay

## 2020-11-15 DIAGNOSIS — R200 Anesthesia of skin: Secondary | ICD-10-CM

## 2020-11-15 MED ORDER — GADOBENATE DIMEGLUMINE 529 MG/ML IV SOLN
20.0000 mL | Freq: Once | INTRAVENOUS | Status: AC | PRN
  Administered 2020-11-15: 20 mL via INTRAVENOUS

## 2020-12-05 ENCOUNTER — Telehealth: Payer: Self-pay | Admitting: Neurology

## 2020-12-07 NOTE — Telephone Encounter (Signed)
I reviewed the MRI with Mr. Patrick Gilbert.  There are multiple T2/FLAIR hyperintense foci in the hemispheres, some in the juxtacortical and periventricular white matter, radially oriented to the ventricles.  Additionally, there are foci in the right cerebellar hemisphere and pons.  Compared to MRIs from 2019 and 2017, there is some progression with more lesions in the hemispheres.  The MRI of the cervical spine 10/28/2020 also showed a focus adjacent to C5 in the posterior spinal cord and a more subtle central focus adjacent to T2.  The combination of his neurologic symptoms and the appearance of the MRI of the brain and cervical spine and the change over time, he meets the McDonald criteria for multiple sclerosis.  I will have him come in next week to discuss disease modifying therapies.

## 2020-12-10 ENCOUNTER — Telehealth: Payer: Self-pay | Admitting: Neurology

## 2020-12-10 NOTE — Telephone Encounter (Signed)
Called pt to offer appointment today per Kara Mead, RN. He stated he could not come today and needed a Thursday. Informed him I would have RN take a look to see when we could get him in on a Thursday as Dr. Epimenio Foot doesn't have an open OV slot on one until June. Please advise.

## 2020-12-10 NOTE — Telephone Encounter (Signed)
Please offer either 9 or 1:30pm slot this Thursday with Dr. Epimenio Foot (keep as )

## 2020-12-10 NOTE — Telephone Encounter (Signed)
Pt accepted appointment this Thursday at 9:00 am. Advised to check in no later than 8:45 am.

## 2020-12-13 ENCOUNTER — Ambulatory Visit: Payer: Medicare Other | Admitting: Neurology

## 2020-12-13 ENCOUNTER — Ambulatory Visit (INDEPENDENT_AMBULATORY_CARE_PROVIDER_SITE_OTHER): Payer: Medicare Other | Admitting: Neurology

## 2020-12-13 ENCOUNTER — Encounter: Payer: Self-pay | Admitting: Neurology

## 2020-12-13 VITALS — BP 133/82 | HR 95 | Ht 66.0 in | Wt 205.0 lb

## 2020-12-13 DIAGNOSIS — R269 Unspecified abnormalities of gait and mobility: Secondary | ICD-10-CM | POA: Diagnosis not present

## 2020-12-13 DIAGNOSIS — E559 Vitamin D deficiency, unspecified: Secondary | ICD-10-CM | POA: Diagnosis not present

## 2020-12-13 DIAGNOSIS — R200 Anesthesia of skin: Secondary | ICD-10-CM

## 2020-12-13 DIAGNOSIS — G35 Multiple sclerosis: Secondary | ICD-10-CM

## 2020-12-13 DIAGNOSIS — R799 Abnormal finding of blood chemistry, unspecified: Secondary | ICD-10-CM | POA: Diagnosis not present

## 2020-12-13 DIAGNOSIS — Z79899 Other long term (current) drug therapy: Secondary | ICD-10-CM | POA: Diagnosis not present

## 2020-12-13 NOTE — Progress Notes (Signed)
[facility]  PATIENT: Patrick Gilbert DOB: 10/03/1955  REFERRING DOCTOR OR PCP:   Norva Riffle PA SOURCE: Patient, notes from primary care  _________________________________   HISTORICAL  CHIEF COMPLAINT:  Chief Complaint  Patient presents with  . Follow-up    RM 13. Here to discuss DMT options for MS.    HISTORY OF PRESENT ILLNESS:  Patrick Gilbert is a 65 year old man who has had many years of left leg numbness.     Update 12/13/2020: Since his last visit, he has had MRI of the cervical spine and brain.  The MRI of the cervical spine shows 2 lesions, 1 posterolaterally to the left adjacent to C5-C6 and another centrally adjacent to T2.  There are degenerative changes but no significant spinal stenosis.  Repeat MRI of the brain 11/15/2020 showed multiple lesions predominantly in the hemispheres but also in the right cerebellar hemisphere consistent with MS.  It is likely that some of the foci are chronic microvascular ischemic change as well.  Compared to the previous MRI from 2019, there are a few new lesions.  Clinically, he notes that his gait is mildly wide and off balance.  He stumbles some but has not fallen.  He needs to use the banister going up and down stairs.  He feels gait has mildly worsened over the last 2 years.  He continues to have some pain into the arms, left greater than right.  He has a pressure sensation near the axilla with numbness into the second through fifth fingers.  He does not note weakness in the arms.  He notes that the left leg is stiff.  Additionally, he sometimes will get some tingling into the left leg.  Gabapentin has helped the tingling some and tizanidine slightly helps the muscle stiffness.   Imaging review: MRI brain 11/15/2020 shows T2/FLAIR hyperintense foci in the periventricular, juxtacortical, subcortical and deep white matter both cerebral hemispheres and the right cerebellar hemisphere.  Most of the foci are nonspecific  though some foci are radially oriented to the ventricles in the periventricular white matter and in the juxtacortical white matter which could be more consistent with chronic demyelinating plaque.  There is likely superimposed chronic microvascular ischemic change.  None of the foci were acute or enhanced.  However, a couple small foci in the cerebral hemispheres were not readily apparent on the 2019 MRI.    MRI of the cervical spine 10/28/2020 shows a T2 hyperintense focus posteriorly adjacent to C5-C6 noted on the axial images and the sagittal STIR images but not on the sagittal T2-weighted images.  This could represents a focus of demyelination or less likely ischemic change.  Artifact cannot be ruled out as it was not present on all of the T2-weighted images.  Has mild spinal stenosis C4-C5 to C6-C7.  MRI lumbar 10/28/2020 shows At L2-L3, there are degenerative changes causing mild spinal stenosis but no nerve root compression.  At L3-L4, there are degenerative changes causing mild spinal stenosis and moderately severe left lateral recess stenosis and moderate right lateral recess stenosis.  There is potential for left L4 nerve root compression.  REVIEW OF SYSTEMS: Constitutional: No fevers, chills, sweats, or change in appetite Eyes: No visual changes, double vision, eye pain Ear, nose and throat: No hearing loss, ear pain, nasal congestion, sore throat Cardiovascular: No chest pain, palpitations Respiratory: No shortness of breath at rest or with exertion.   No wheezes GastrointestinaI: No nausea, vomiting, diarrhea, abdominal pain, fecal incontinence  Genitourinary: No dysuria, urinary retention or frequency.  No nocturia. Musculoskeletal:As above  integumentary: No rash, pruritus, skin lesions Neurological: as above Psychiatric: No depression at this time.  No anxiety Endocrine: No palpitations, diaphoresis, change in appetite, change in weigh or increased thirst Hematologic/Lymphatic: No  anemia, purpura, petechiae. Allergic/Immunologic: No itchy/runny eyes, nasal congestion, recent allergic reactions, rashes  ALLERGIES: Allergies  Allergen Reactions  . Lipitor [Atorvastatin Calcium] Other (See Comments)    Muscle pain, which he does not have with Rosuvastatin.    HOME MEDICATIONS:  Current Outpatient Medications:  .  gabapentin (NEURONTIN) 300 MG capsule, Take 1 capsule (300 mg total) by mouth 3 (three) times daily., Disp: 90 capsule, Rfl: 11 .  losartan-hydrochlorothiazide (HYZAAR) 50-12.5 MG tablet, Take 1 tablet by mouth once daily, Disp: 60 tablet, Rfl: 0 .  tiZANidine (ZANAFLEX) 2 MG tablet, One po qAM and two po qHS, Disp: 90 tablet, Rfl: 5 .  rosuvastatin (CRESTOR) 20 MG tablet, 1/2 tab by mouth daily with evening meal (Patient not taking: Reported on 12/13/2020), Disp: 30 tablet, Rfl: 6  PAST MEDICAL HISTORY: Past Medical History:  Diagnosis Date  . Headache 03/2016  . High blood pressure   . High cholesterol     PAST SURGICAL HISTORY: Past Surgical History:  Procedure Laterality Date  . Repair of left shoulder laceration     cut with broken glass bottle.  Missed a major artery by just a small measurement per patient    FAMILY HISTORY: Family History  Problem Relation Age of Onset  . Hypertension Mother   . Heart disease Mother        unknown open heart surgery  . Hypertension Father   . Peripheral vascular disease Father   . Heart disease Brother        Unknown heart issue  . Seizures Daughter     SOCIAL HISTORY:  Social History   Socioeconomic History  . Marital status: Legally Separated    Spouse name: Not on file  . Number of children: 2  . Years of education: 4  . Highest education level: 11th grade  Occupational History  . Occupation: Estate manager/land agent job  Tobacco Use  . Smoking status: Current Every Day Smoker    Packs/day: 0.50    Years: 48.00    Pack years: 24.00    Types: Cigarettes  . Smokeless tobacco: Former Geophysicist/field seismologist  . Vaping Use: Never used  Substance and Sexual Activity  . Alcohol use: No    Comment: History of drinking too much.  Stopped at age 60 yo  . Drug use: Not Currently    Frequency: 1.0 times per week    Types: Marijuana    Comment: Now and then; history of crack cocaine  . Sexual activity: Not on file  Other Topics Concern  . Not on file  Social History Narrative   Lives alone in an apartment on the 10th floor.  Has 3 children but one is deceased.  Works at an Engineer, maintenance.  Education: 11th grade.   Coffee sometimes and MT Dew   Social Determinants of Health   Financial Resource Strain: Not on file  Food Insecurity: Not on file  Transportation Needs: Not on file  Physical Activity: Not on file  Stress: Not on file  Social Connections: Not on file  Intimate Partner Violence: Not on file     PHYSICAL EXAM  Vitals:   12/13/20 1307  BP: 133/82  Pulse: 95  SpO2: 98%  Weight: 205 lb (93 kg)  Height: 5\' 6"  (1.676 m)    Body mass index is 33.09 kg/m.   General: The patient is well-developed and well-nourished and in no acute distress  HEENT:  Head is [place]/AT.  Sclera are anicteric.     Neck:  .  The neck is nontender.   Skin: Extremities are without rash or  edema.  Neurologic Exam  Mental status: The patient is alert and oriented x 3 at the time of the examination. The patient has apparent normal recent and remote memory, with an apparently normal attention span and concentration ability.   Speech is normal.  Cranial nerves: Extraocular movements are full.  Facial symmetry is present. There is good facial sensation to soft touch bilaterally.Facial strength is normal.  Trapezius and sternocleidomastoid strength is normal. No dysarthria is noted.    No obvious hearing deficits are noted.  Motor:  Muscle bulk is normal.   Tone is mildly increased in the left leg. Strength is  5 / 5 in all 4 extremities.   Sensory: Sensory testing is intact to pinprick, soft  touch and vibration sensation in the right arm and leg.  He reported mildly reduced sensation to touch in the C7 distribution of the left hand.  Some reduced sensation near the ankle on the left to touch, symmetric sensation to vibration.  Coordination: Cerebellar testing reveals good finger-nose-finger and heel-to-shin bilaterally.  Gait and station: Station is normal.   Gait is mildly spastic on the left. Tandem gait is slightly wide. Romberg is negative.   Reflexes: Deep tendon reflexes are symmetric and normal bilaterally.        DIAGNOSTIC DATA (LABS, IMAGING, TESTING) - I reviewed patient records, labs, notes, testing and imaging myself where available.  Lab Results  Component Value Date   WBC 6.0 04/22/2019   HGB 15.0 04/22/2019   HCT 44.6 04/22/2019   MCV 88.1 04/22/2019   PLT 195 04/22/2019      Component Value Date/Time   NA 137 04/22/2019 1252   NA 139 08/10/2018 1103   K 3.6 04/22/2019 1252   CL 101 04/22/2019 1252   CO2 26 04/22/2019 1252   GLUCOSE 93 04/22/2019 1252   BUN 9 04/22/2019 1252   BUN 11 08/10/2018 1103   CREATININE 1.04 04/22/2019 1252   CALCIUM 9.6 04/22/2019 1252   PROT 6.3 (L) 04/22/2019 1252   PROT 7.0 10/11/2018 0850   ALBUMIN 3.9 04/22/2019 1252   ALBUMIN 4.8 10/11/2018 0850   AST 25 04/22/2019 1252   ALT 38 04/22/2019 1252   ALKPHOS 38 04/22/2019 1252   BILITOT 0.6 04/22/2019 1252   BILITOT 0.6 10/11/2018 0850   GFRNONAA >60 04/22/2019 1252   GFRAA >60 04/22/2019 1252   Lab Results  Component Value Date   CHOL 143 10/11/2018   HDL 44 10/11/2018   LDLCALC 77 10/11/2018   TRIG 110 10/11/2018   CHOLHDL 5.3 03/28/2016   Lab Results  Component Value Date   HGBA1C 5.9 (H) 03/27/2016   No results found for: VITAMINB12 Lab Results  Component Value Date   TSH 0.996 03/27/2016       ASSESSMENT AND PLAN  Multiple sclerosis (HCC) - Plan: CBC with Differential/Platelet, Comprehensive metabolic panel, VITAMIN D 25 Hydroxy (Vit-D  Deficiency, Fractures), QuantiFERON-TB Gold Plus  High risk medication use - Plan: CBC with Differential/Platelet, Comprehensive metabolic panel, VITAMIN D 25 Hydroxy (Vit-D Deficiency, Fractures), QuantiFERON-TB Gold Plus  Numbness  Gait disturbance  1.  I had  a long conversation about the significance of the MRIs and his clinical symptoms and exam.  Studies and findings are consistent with multiple sclerosis.  He appears to have a low level of aggressiveness as it has been only mild change between 2019 and 2021 with a couple new lesions on MRI.  Clinically, though the increase of symptoms in the limbs is likely due to foci noted on the cervical spine MRI.  I recommend that we initiate a disease modifying therapy.  Due to the lower level of aggressiveness and his age, Ashok Cordia might be a good initial option for him due to its relative safety.  We will check some blood work today and he signed the service request form. 2.   Continue gabapentin and tizanidine 3.   Stay active and exercise as tolerated. 4.   Return in 5-6 months or sooner if new or worsening neurologic symptoms.  45-minute office visit with the majority of the time spent face-to-face for history and physical, discussion/counseling and decision-making.  Additional time with record review and documentation.  Miasia Crabtree A. Epimenio Foot, MD, [facility] 12/13/2020, 5:09 PM Certified in Neurology, Clinical Neurophysiology, Sleep Medicine and Neuroimaging  [facility] [address] [place], [place] [address] [phone]

## 2020-12-16 LAB — QUANTIFERON-TB GOLD PLUS
QuantiFERON Mitogen Value: >10 IU/mL
QuantiFERON Nil Value: 0.06 IU/mL
QuantiFERON TB1 Ag Value: 0.03 IU/mL
QuantiFERON TB2 Ag Value: 0.03 IU/mL
QuantiFERON-TB Gold Plus: NEGATIVE

## 2020-12-16 LAB — CBC WITH DIFFERENTIAL/PLATELET
Basophils Absolute: 0 10*3/uL (ref 0.0–0.2)
Basos: 1 %
EOS (ABSOLUTE): 0.1 10*3/uL (ref 0.0–0.4)
Eos: 2 %
Hematocrit: 47.3 % (ref 37.5–51.0)
Hemoglobin: 15.9 g/dL (ref 13.0–17.7)
Immature Grans (Abs): 0 10*3/uL (ref 0.0–0.1)
Immature Granulocytes: 0 %
Lymphocytes Absolute: 2.5 10*3/uL (ref 0.7–3.1)
Lymphs: 41 %
MCH: 28.4 pg (ref 26.6–33.0)
MCHC: 33.6 g/dL (ref 31.5–35.7)
MCV: 85 fL (ref 79–97)
Monocytes Absolute: 0.6 10*3/uL (ref 0.1–0.9)
Monocytes: 9 %
Neutrophils Absolute: 2.8 10*3/uL (ref 1.4–7.0)
Neutrophils: 47 %
Platelets: 235 10*3/uL (ref 150–450)
RBC: 5.59 x10E6/uL (ref 4.14–5.80)
RDW: 13.5 % (ref 11.6–15.4)
WBC: 6.1 10*3/uL (ref 3.4–10.8)

## 2020-12-16 LAB — COMPREHENSIVE METABOLIC PANEL
ALT: 32 IU/L (ref 0–44)
AST: 18 IU/L (ref 0–40)
Albumin/Globulin Ratio: 1.8 (ref 1.2–2.2)
Albumin: 4.8 g/dL (ref 3.8–4.8)
Alkaline Phosphatase: 71 IU/L (ref 44–121)
BUN/Creatinine Ratio: 11 (ref 10–24)
BUN: 11 mg/dL (ref 8–27)
Bilirubin Total: 0.4 mg/dL (ref 0.0–1.2)
CO2: 19 mmol/L — ABNORMAL LOW (ref 20–29)
Calcium: 9.9 mg/dL (ref 8.6–10.2)
Chloride: 103 mmol/L (ref 96–106)
Creatinine, Ser: 0.96 mg/dL (ref 0.76–1.27)
Globulin, Total: 2.7 g/dL (ref 1.5–4.5)
Glucose: 92 mg/dL (ref 65–99)
Potassium: 4 mmol/L (ref 3.5–5.2)
Sodium: 141 mmol/L (ref 134–144)
Total Protein: 7.5 g/dL (ref 6.0–8.5)
eGFR: 88 mL/min/{1.73_m2} (ref >59)

## 2020-12-16 LAB — VITAMIN D 25 HYDROXY (VIT D DEFICIENCY, FRACTURES): Vit D, 25-Hydroxy: 22.5 ng/mL — ABNORMAL LOW (ref 30.0–100.0)

## 2020-12-17 ENCOUNTER — Telehealth: Payer: Self-pay

## 2020-12-17 NOTE — Telephone Encounter (Signed)
-----   Message from Asa Lente, MD sent at 12/17/2020  2:45 PM EDT ----- Labs are okay.  We can send in the Aubagio form.  Vitamin D is low.  He should take 5000 units a day over-the-counter

## 2020-12-17 NOTE — Telephone Encounter (Signed)
I called patient.  I advised him that his labs are okay and we can start Aubagio.  I advised him that his vitamin D is low and recommended 5000 units daily over-the-counter.  I reminded him that once he starts Aubagio he will need once a month LFTs for 6 months.  I will work on English as a second language teacher for [employer].  Patient verbalized understanding.  Completed PA for Aubagio CMM.  Sent to [employer].  Should have a determination within 1-3 business days. Key: [id number].

## 2020-12-18 NOTE — Telephone Encounter (Signed)
Received notice from [facility] that Ashok Cordia is on patient's formulary.   "This medication or product is on your plan's list of covered drugs. Prior authorization is not required at this time. If your pharmacy has questions regarding the processing of your prescription, please have them call the [facility] pharmacy help desk at [phone]"  Faxed Aubagio start form to [facility].  Received a receipt of confirmation.

## 2020-12-19 DIAGNOSIS — F17210 Nicotine dependence, cigarettes, uncomplicated: Secondary | ICD-10-CM | POA: Diagnosis not present

## 2020-12-19 DIAGNOSIS — I700 Atherosclerosis of aorta: Secondary | ICD-10-CM | POA: Diagnosis not present

## 2020-12-19 DIAGNOSIS — Z79899 Other long term (current) drug therapy: Secondary | ICD-10-CM | POA: Diagnosis not present

## 2020-12-19 DIAGNOSIS — I70213 Atherosclerosis of native arteries of extremities with intermittent claudication, bilateral legs: Secondary | ICD-10-CM | POA: Diagnosis not present

## 2020-12-19 DIAGNOSIS — I10 Essential (primary) hypertension: Secondary | ICD-10-CM | POA: Diagnosis not present

## 2020-12-19 DIAGNOSIS — R7303 Prediabetes: Secondary | ICD-10-CM | POA: Diagnosis not present

## 2020-12-19 DIAGNOSIS — E785 Hyperlipidemia, unspecified: Secondary | ICD-10-CM | POA: Diagnosis not present

## 2020-12-19 DIAGNOSIS — G35 Multiple sclerosis: Secondary | ICD-10-CM | POA: Diagnosis not present

## 2020-12-19 DIAGNOSIS — E559 Vitamin D deficiency, unspecified: Secondary | ICD-10-CM | POA: Diagnosis not present

## 2020-12-20 NOTE — Telephone Encounter (Signed)
Pt called, checking on if my new medication has been sent. Would like a call from the nurse.

## 2020-12-20 NOTE — Telephone Encounter (Signed)
I called pt back. Advised KD,RN working on getting him started on medication/coordinate with specialty pharmacy. I will have her follow up with him Mon/Tues of next week. He verbalized understanding.

## 2020-12-24 ENCOUNTER — Other Ambulatory Visit: Payer: Self-pay | Admitting: *Deleted

## 2020-12-24 DIAGNOSIS — G35 Multiple sclerosis: Secondary | ICD-10-CM

## 2020-12-24 MED ORDER — AUBAGIO 14 MG PO TABS: 1.0000 | ORAL_TABLET | Freq: Every day | ORAL | 3 refills | Status: DC

## 2020-12-24 NOTE — Telephone Encounter (Signed)
I called [employer] [facility]. Spoke with [employer].  They have mailed patient the patient assistance form to be filled out and mailed back to them.  There is nothing needed from our office except to be faxed Aubagio prescription to the patient assistance program, fax [phone].  I called patient to advise him to look out for the patient assistance form in the mail.  No answer, left a voicemail asking him to call me back.

## 2020-12-31 NOTE — Telephone Encounter (Signed)
I called patient.  He did receive the patient assistance forms for Aubagio.  He has not mailed them back yet.  He will complete them and mail them back as soon as he can.  Patient is complaining of back and neck pain.  He reports of intermittent left hand numbness.  He is complaining of back tightness.  He reports that gabapentin 300 mg 3 times daily is not helping.  He also reports tizanidine is not helping either.  He reports that one of these medications is making him sweat at night and he would like to stop it.  He wants to know what Dr. Epimenio Foot recommends for his ongoing back and neck pain.  I advised him I will speak with Dr. Epimenio Foot when he returns to the office tomorrow and will call him back.  In the meantime, we will work on his patient assistance forms for Aubagio.

## 2021-01-07 ENCOUNTER — Other Ambulatory Visit: Payer: Self-pay | Admitting: *Deleted

## 2021-01-07 MED ORDER — DULOXETINE HCL 30 MG PO CPEP: ORAL_CAPSULE | ORAL | 3 refills | Status: DC

## 2021-01-07 NOTE — Telephone Encounter (Signed)
Patient left a voicemail this morning asking for a call to discuss his gabapentin.

## 2021-01-07 NOTE — Addendum Note (Signed)
Addended by: Arther Abbott on: 01/07/2021 11:38 AM   Modules accepted: Orders

## 2021-01-07 NOTE — Telephone Encounter (Signed)
Called pt back. He ran out of gabapentin, he already stopped this d/t running out. He is agreeable to try Cymbalta. I e-scribed to [facility] on file per pt request. He will call back if he does not notice any benefit after a couple weeks.

## 2021-01-07 NOTE — Telephone Encounter (Signed)
Spoke with Dr. Terrace Arabia who is the work in MD this morning. She recommends adding Cymbalta 30mg  po qd every morning after meal. If gabapentin helps some, he can add cymbalta. If it is not helping at all, he should stop it and just take cymbalta

## 2021-01-14 NOTE — Telephone Encounter (Signed)
[employer] received patient's financial assistance application today and is reviewing it.

## 2021-01-15 NOTE — Telephone Encounter (Signed)
Received notice from [employer] that patient was approved for their patient assistance program for aubagio Jan 15, 2021 through September 14, 2021.

## 2021-01-28 NOTE — Telephone Encounter (Signed)
I called patient to discuss if he received his Aubagio.  No answer, left a voicemail asking him to call me back.

## 2021-01-29 NOTE — Telephone Encounter (Signed)
I called patient.  He received his Aubagio a few weeks ago and started taking it.  He is tolerating it well with no reported side effects.  I reminded him of his once monthly lab work for the next 6 months.  Patient would like to come on Mondays at 1 PM.  We have scheduled the next 6 months worth of lab appointments.  Patient verbalized understanding.

## 2021-02-18 ENCOUNTER — Other Ambulatory Visit: Payer: Self-pay | Admitting: Neurology

## 2021-02-18 ENCOUNTER — Other Ambulatory Visit (INDEPENDENT_AMBULATORY_CARE_PROVIDER_SITE_OTHER): Payer: Self-pay

## 2021-02-18 DIAGNOSIS — Z0289 Encounter for other administrative examinations: Secondary | ICD-10-CM

## 2021-02-18 DIAGNOSIS — Z79899 Other long term (current) drug therapy: Secondary | ICD-10-CM

## 2021-02-18 DIAGNOSIS — G35 Multiple sclerosis: Secondary | ICD-10-CM

## 2021-02-19 LAB — HEPATIC FUNCTION PANEL
ALT: 47 IU/L — ABNORMAL HIGH (ref 0–44)
AST: 24 IU/L (ref 0–40)
Albumin: 4.6 g/dL (ref 3.8–4.8)
Alkaline Phosphatase: 75 IU/L (ref 44–121)
Bilirubin Total: <0.2 mg/dL (ref 0.0–1.2)
Bilirubin, Direct: <0.1 mg/dL (ref 0.00–0.40)
Total Protein: 7.2 g/dL (ref 6.0–8.5)

## 2021-03-06 ENCOUNTER — Telehealth: Payer: Self-pay | Admitting: *Deleted

## 2021-03-06 NOTE — Telephone Encounter (Signed)
Called pt. Received fax from [employer] that they have been unable to reach pt. Asked him to call them back at [phone]. Pt verbalized understanding and will call back.

## 2021-03-15 DEATH — deceased

## 2021-03-25 ENCOUNTER — Other Ambulatory Visit (INDEPENDENT_AMBULATORY_CARE_PROVIDER_SITE_OTHER): Payer: Medicare Other

## 2021-03-25 DIAGNOSIS — Z0289 Encounter for other administrative examinations: Secondary | ICD-10-CM

## 2021-03-25 DIAGNOSIS — G35 Multiple sclerosis: Secondary | ICD-10-CM

## 2021-03-25 DIAGNOSIS — Z79899 Other long term (current) drug therapy: Secondary | ICD-10-CM

## 2021-03-26 ENCOUNTER — Telehealth: Payer: Self-pay | Admitting: *Deleted

## 2021-03-26 LAB — HEPATIC FUNCTION PANEL
ALT: 43 IU/L (ref 0–44)
AST: 20 IU/L (ref 0–40)
Albumin: 4.5 g/dL (ref 3.8–4.8)
Alkaline Phosphatase: 68 IU/L (ref 44–121)
Bilirubin Total: 0.4 mg/dL (ref 0.0–1.2)
Bilirubin, Direct: 0.13 mg/dL (ref 0.00–0.40)
Total Protein: 7.1 g/dL (ref 6.0–8.5)

## 2021-03-26 NOTE — Telephone Encounter (Signed)
Called and spoke w/ pt about results per Dr. Sater's note. Pt verbalized understanding.  

## 2021-03-26 NOTE — Telephone Encounter (Signed)
-----   Message from Asa Lente, MD sent at 03/26/2021  8:18 AM EDT ----- Please let the patient know that the lab work is fine.

## 2021-04-15 ENCOUNTER — Other Ambulatory Visit: Payer: Self-pay

## 2021-05-07 ENCOUNTER — Encounter: Payer: Self-pay | Admitting: Allergy & Immunology

## 2021-05-07 ENCOUNTER — Other Ambulatory Visit: Payer: Self-pay

## 2021-05-07 ENCOUNTER — Telehealth: Payer: Self-pay

## 2021-05-07 ENCOUNTER — Ambulatory Visit (INDEPENDENT_AMBULATORY_CARE_PROVIDER_SITE_OTHER): Payer: Medicare Other | Admitting: Allergy & Immunology

## 2021-05-07 VITALS — BP 126/76 | HR 73 | Temp 97.7°F | Resp 16 | Ht 66.0 in | Wt 198.2 lb

## 2021-05-07 DIAGNOSIS — J301 Allergic rhinitis due to pollen: Secondary | ICD-10-CM | POA: Diagnosis not present

## 2021-05-07 NOTE — Patient Instructions (Addendum)
1. Seasonal allergic rhinitis due to pollen - Testing today showed: ragweed, weeds, and outdoor molds - Copy of test results provided.  - Avoidance measures provided. - Continue with: Xyzal (levocetirizine) 5mg  tablet once daily and Singulair (montelukast) 10mg  daily - Start taking: Atrovent (ipratropium) one spray per nostril every 8 hours as needed (start once daily and increase from there since it can be over drying and cause nose bleeds, aim for the EARS). - You can use an extra dose of the antihistamine, if needed, for breakthrough symptoms.  - Consider nasal saline rinses 1-2 times daily to remove allergens from the nasal cavities as well as help with mucous clearance (this is especially helpful to do before the nasal sprays are given)  2. Return in about 3 months (around 08/07/2021).    Please inform of any Emergency Department visits, hospitalizations, or changes in symptoms. Call 08/09/2021 before going to the ED for breathing or allergy symptoms since we might be able to fit you in for a sick visit. Feel free to contact us anytime with any questions, problems, or concerns.  It was a pleasure to meet you today!  Websites that have reliable patient information: 1. American Academy of Asthma, Allergy, and Immunology: www.aaaai.org 2. Food Allergy Research and Education (FARE): foodallergy.org 3. Mothers of Asthmatics: http://www.asthmacommunitynetwork.org 4. American College of Allergy, Asthma, and Immunology: www.acaai.org   COVID-19 Vaccine Information can be found at: [place] For questions related to vaccine distribution or appointments, please email [email or web address] or call [phone].   We realize that you might be concerned about having an allergic reaction to the COVID19 vaccines. To help with that concern, WE ARE OFFERING THE COVID19 VACCINES IN OUR OFFICE! Ask the front desk for dates!     "Like" [email or web address]  on Facebook and Instagram for our latest updates!      A healthy democracy works best when [phone] participate! Make sure you are registered to vote! If you have moved or changed any of your contact information, you will need to get this updated before voting!  In some cases, you MAY be able to register to vote online: [place]   1. Control-Buffer 50% Glycerol Negative   2. Control-Histamine 1 mg/ml 2+   3. Albumin saline Negative   4. Bahia Negative   5. Applied Materials Negative   6. Johnson Negative   7. [place] Blue Negative   8. Meadow Fescue Negative   9. Perennial Rye Negative   10. Sweet Vernal Negative   11. Timothy Negative   12. Cocklebur Negative   13. Burweed Marshelder Negative   14. Ragweed, short Negative   15. Ragweed, Giant Negative   16. Plantain,  English Negative   17. Lamb's Quarters Negative   18. Sheep Sorrell Negative   19. Rough Pigweed Negative   20. Marsh Elder, Rough Negative   21. Mugwort, Common Negative   22. Ash mix Negative   23. Birch mix Negative   24. Beech American Negative   25. Box, Elder Negative   26. Cedar, red Negative   27. Cottonwood, [email or web address] Negative   28. Elm mix Negative   29. Hickory Negative   30. Maple mix Negative   31. Oak, [place] mix Negative   32. Pecan Pollen Negative   33. Pine mix Negative   34. Sycamore Eastern Negative   35. Walnut, Black Pollen Negative   36. Alternaria alternata Negative   37. Cladosporium Herbarum Negative   38. Aspergillus mix Negative   39. Penicillium mix  Negative   40. Bipolaris sorokiniana (Helminthosporium) Negative   41. Drechslera spicifera (Curvularia) Negative   42. Mucor plumbeus Negative   43. Fusarium moniliforme Negative   44. Aureobasidium pullulans (pullulara) Negative   45. Rhizopus oryzae Negative   46. Botrytis cinera Negative   47. Epicoccum nigrum Negative   48. Phoma betae Negative   49. Candida Albicans Negative   50.  Trichophyton mentagrophytes Negative   51. Mite, D Farinae  5,000 AU/ml Negative   52. Mite, D Pteronyssinus  5,000 AU/ml Negative   53. Cat Hair 10,000 BAU/ml Negative   54.  Dog Epithelia Negative   55. Mixed Feathers Negative   56. Horse Epithelia Negative   57. Cockroach, German Negative   58. Mouse Negative   59. Tobacco Leaf Negative      Control Negative   [place] Negative   Johnson Negative   7 Grass Negative   Ragweed mix 1+   Weed mix 1+   Tree mix Negative   Mold 1 Negative   Mold 2 Negative   Mold 3 2+   Mold 4 Negative   Cat Negative   Dog Negative   Cockroach Negative   Mite mix Negative     Reducing Pollen Exposure  The American Academy of Allergy, Asthma and Immunology suggests the following steps to reduce your exposure to pollen during allergy seasons.    Do not hang sheets or clothing out to dry; pollen may collect on these items. Do not mow lawns or spend time around freshly cut grass; mowing stirs up pollen. Keep windows closed at night.  Keep car windows closed while driving. Minimize morning activities outdoors, a time when pollen counts are usually at their highest. Stay indoors as much as possible when pollen counts or humidity is high and on windy days when pollen tends to remain in the air longer. Use air conditioning when possible.  Many air conditioners have filters that trap the pollen spores. Use a HEPA room air filter to remove pollen form the indoor air you breathe.  Control of Mold Allergen   Mold and fungi can grow on a variety of surfaces provided certain temperature and moisture conditions exist.  Outdoor molds grow on plants, decaying vegetation and soil.  The major outdoor mold, Alternaria and Cladosporium, are found in very high numbers during hot and dry conditions.  Generally, a late Summer - Fall peak is seen for common outdoor fungal spores.  Rain will temporarily lower outdoor mold spore count, but counts rise rapidly when the  rainy period ends.  The most important indoor molds are Aspergillus and Penicillium.  Dark, humid and poorly ventilated basements are ideal sites for mold growth.  The next most common sites of mold growth are the bathroom and the kitchen.  Outdoor (Seasonal) Mold Control  Positive outdoor molds via skin testing: Bipolaris (Helminthsporium), Drechslera (Curvalaria), and Mucor  Use air conditioning and keep windows closed Avoid exposure to decaying vegetation. Avoid leaf raking. Avoid grain handling. Consider wearing a face mask if working in moldy areas.

## 2021-05-07 NOTE — Telephone Encounter (Signed)
I called patient.  He is overdue for his LFTs this month.  He will come to the office within this next week to complete this.  I gave him office hours.  Patient verbalized understanding.

## 2021-05-07 NOTE — Progress Notes (Signed)
NEW PATIENT  Date of Service/Encounter:  05/07/21  Consult requested by: Patient, No Pcp Per (Inactive)   Assessment:   Seasonal allergic rhinitis due to pollen (ragweed, weeds, and outdoor molds)  Multiple sclerosis - on Aubagio  Plan/Recommendations:   1. Seasonal allergic rhinitis due to pollen - Testing today showed: ragweed, weeds, and outdoor molds - Copy of test results provided.  - Avoidance measures provided. - Continue with: Xyzal (levocetirizine) 5mg  tablet once daily and Singulair (montelukast) 10mg  daily - Start taking: Atrovent (ipratropium) one spray per nostril every 8 hours as needed (start once daily and increase from there since it can be over drying and cause nose bleeds, aim for the EARS). - You can use an extra dose of the antihistamine, if needed, for breakthrough symptoms.  - Consider nasal saline rinses 1-2 times daily to remove allergens from the nasal cavities as well as help with mucous clearance (this is especially helpful to do before the nasal sprays are given)  2. Return in about 3 months (around 08/07/2021).    This note in its entirety was forwarded to the Provider who requested this consultation.  Subjective:   Patrick Gilbert is a 65 y.o. male presenting today for evaluation of  Chief Complaint  Patient presents with   Allergic Rhinitis     Congestion - mucus stuck in his head. Stopped smoking cigarettes for 5 days, has watery eyes. He is currently taking singular ans xzyzal.     Patrick Gilbert has a history of the following: Patient Active Problem List   Diagnosis Date Noted   Multiple sclerosis (HCC) 12/13/2020   High risk medication use 12/13/2020   Gait disturbance 12/13/2020   Left leg pain 07/27/2018   Numbness 07/27/2018   Essential hypertension 07/27/2018   Hyperlipidemia 07/27/2018   Abnormal brain MRI    Chest pain 03/27/2016   AKI (acute kidney injury) (HCC) 03/27/2016   Headache 03/27/2016   Tobacco abuse 03/27/2016    Abnormal EKG    Cephalalgia    Pain in the chest     History obtained from: chart review and patient.  03/29/2016 was referred by Patient, No Pcp Per (Inactive).     Patrick Gilbert is a 66 y.o. male presenting for an evaluation of chronic rhinitis .  Allergic Rhinitis Symptom History: He has allergy symptoms that were worse last week and last month. He did get steroids and antibiotics once but it did not help. He reports a lot of drainage throughout the year. He had this last year but the summers are worse. Spring can be bad as well. [place]. He has never been tested.  He thinks that it improves in the winter time. He denies issues with animals. He does have a paraakeet in his home for the past year.   He has MS that was just recently diagnosed within the last 6 months. He is getting Aubagio tablets daily. He was diagnosed after an MRI which was ordered for neuropathy.   Otherwise, there is no history of other atopic diseases, including asthma, food allergies, drug allergies, stinging insect allergies, eczema, urticaria, or contact dermatitis. There is no significant infectious history. Vaccinations are up to date.    Past Medical History: Patient Active Problem List   Diagnosis Date Noted   Multiple sclerosis (HCC) 12/13/2020   High risk medication use 12/13/2020   Gait disturbance 12/13/2020   Left leg pain 07/27/2018   Numbness 07/27/2018  Essential hypertension 07/27/2018   Hyperlipidemia 07/27/2018   Abnormal brain MRI    Chest pain 03/27/2016   AKI (acute kidney injury) (HCC) 03/27/2016   Headache 03/27/2016   Tobacco abuse 03/27/2016   Abnormal EKG    Cephalalgia    Pain in the chest     Medication List:  Allergies as of 05/07/2021       Reactions   Lipitor [atorvastatin Calcium] Other (See Comments)   Muscle pain, which he does not have with Rosuvastatin.        Medication List        Accurate as of May 07, 2021 12:13 PM. If  you have any questions, ask your nurse or doctor.          Aubagio 14 MG Tabs Generic drug: Teriflunomide Take 1 tablet by mouth daily.   cetirizine 10 MG tablet Commonly known as: ZYRTEC Take 10 mg by mouth daily.   DULoxetine 30 MG capsule Commonly known as: Cymbalta Take 1 capsule by mouth every morning after a meal   levocetirizine 5 MG tablet Commonly known as: XYZAL Take 5 mg by mouth daily as needed.   losartan-hydrochlorothiazide 50-12.5 MG tablet Commonly known as: HYZAAR Take 1 tablet by mouth once daily   montelukast 10 MG tablet Commonly known as: SINGULAIR Take 10 mg by mouth daily.   rosuvastatin 20 MG tablet Commonly known as: Crestor 1/2 tab by mouth daily with evening meal   tiZANidine 2 MG tablet Commonly known as: ZANAFLEX One po qAM and two po qHS        Birth History: non-contributory  Developmental History: non-contributory  Past Surgical History: Past Surgical History:  Procedure Laterality Date   Repair of left shoulder laceration     cut with broken glass bottle.  Missed a major artery by just a small measurement per patient     Family History: Family History  Problem Relation Age of Onset   Hypertension Mother    Heart disease Mother        unknown open heart surgery   Hypertension Father    Peripheral vascular disease Father    Heart disease Brother        Unknown heart issue   Seizures Daughter      Social History: Patrick Gilbert lives at home by himself. 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  He was in an apartment with carpeting throughout the home.  There is no mildew damage.  They have electric heating and central cooling.  There are no cats and dogs inside of the home.  There is one bird inside of the home.  There are no dust mite covers on the bedding.  There is no tobacco exposure.  He is retired.  He is not exposed to fumes, chemicals, or dust.  He does not use  a HEPA filter.  He previously was a smoker but stopped within the last week.  It seems that he has stopped intermittently over the course of several years.1   Review of Systems  Constitutional: Negative.  Negative for chills, fever, malaise/fatigue and weight loss.  HENT:  Positive for congestion and sinus pain. Negative for ear discharge and ear pain.        Positive for postnasal drip.  Eyes:  Negative for pain, discharge and redness.  Respiratory:  Negative for cough, sputum production, shortness of breath and wheezing.   Cardiovascular: Negative.  Negative for chest pain and  palpitations.  Gastrointestinal:  Negative for abdominal pain, constipation, diarrhea, heartburn, nausea and vomiting.  Skin: Negative.  Negative for itching and rash.  Neurological:  Negative for dizziness and headaches.  Endo/Heme/Allergies:  Positive for environmental allergies. Does not bruise/bleed easily.      Objective:   Blood pressure 126/76, pulse 73, temperature 97.7 F (36.5 C), resp. rate 16, height 5\' 6"  (1.676 m), weight 198 lb 3.2 oz (89.9 kg), SpO2 98 %. Body mass index is 31.99 kg/m.   Physical Exam:   Physical Exam Vitals reviewed.  Constitutional:      Appearance: He is well-developed.  HENT:     Head: Normocephalic and atraumatic.     Right Ear: Tympanic membrane, ear canal and external ear normal. No drainage, swelling or tenderness. Tympanic membrane is not injected, scarred, erythematous, retracted or bulging.     Left Ear: Tympanic membrane, ear canal and external ear normal. No drainage, swelling or tenderness. Tympanic membrane is not injected, scarred, erythematous, retracted or bulging.     Nose: Mucosal edema and rhinorrhea present. No nasal deformity or septal deviation.     Right Turbinates: Enlarged, swollen and pale.     Left Turbinates: Enlarged, swollen and pale.     Right Sinus: No maxillary sinus tenderness or frontal sinus tenderness.     Left Sinus: No maxillary  sinus tenderness or frontal sinus tenderness.     Comments: No nasal polyps.    Mouth/Throat:     Mouth: Mucous membranes are not pale and not dry.     Pharynx: Uvula midline.     Comments: Marked cobblestoning. Eyes:     General:        Right eye: No discharge.        Left eye: No discharge.     Conjunctiva/sclera: Conjunctivae normal.     Right eye: Right conjunctiva is not injected. No chemosis.    Left eye: Left conjunctiva is not injected. No chemosis.    Pupils: Pupils are equal, round, and reactive to light.  Cardiovascular:     Rate and Rhythm: Normal rate and regular rhythm.     Heart sounds: Normal heart sounds.  Pulmonary:     Effort: Pulmonary effort is normal. No tachypnea, accessory muscle usage or respiratory distress.     Breath sounds: Normal breath sounds. No wheezing, rhonchi or rales.     Comments: Moving air well in all lung fields.  No increased work of breathing. Chest:     Chest wall: No tenderness.  Abdominal:     Tenderness: There is no abdominal tenderness. There is no guarding or rebound.  Lymphadenopathy:     Head:     Right side of head: No submandibular, tonsillar or occipital adenopathy.     Left side of head: No submandibular, tonsillar or occipital adenopathy.     Cervical: No cervical adenopathy.  Skin:    General: Skin is warm.     Capillary Refill: Capillary refill takes less than 2 seconds.     Coloration: Skin is not pale.     Findings: No abrasion, erythema, petechiae or rash. Rash is not papular, urticarial or vesicular.     Comments: No eczematous or urticarial lesions noted.  Neurological:     Mental Status: He is alert.  Psychiatric:        Behavior: Behavior is cooperative.     Diagnostic studies:   Allergy Studies:     Airborne Adult Perc - 05/07/21 9622  Time Antigen Placed 6283    Allergen Manufacturer Greer    Location Back    Number of Test 59    Panel 1 Select    1. Control-Buffer 50% Glycerol Negative    2.  Control-Histamine 1 mg/ml 2+    3. Albumin saline Negative    4. Bahia Negative    5. French Southern Territories Negative    6. Johnson Negative    7. [place] Blue Negative    8. Meadow Fescue Negative    9. Perennial Rye Negative    10. Sweet Vernal Negative    11. Timothy Negative    12. Cocklebur Negative    13. Burweed Marshelder Negative    14. Ragweed, short Negative    15. Ragweed, Giant Negative    16. Plantain,  English Negative    17. Lamb's Quarters Negative    18. Sheep Sorrell Negative    19. Rough Pigweed Negative    20. Marsh Elder, Rough Negative    21. Mugwort, Common Negative    22. Ash mix Negative    23. Birch mix Negative    24. Beech American Negative    25. Box, Elder Negative    26. Cedar, red Negative    27. Cottonwood, Guinea-Bissau Negative    28. Elm mix Negative    29. Hickory Negative    30. Maple mix Negative    31. Oak, Guinea-Bissau mix Negative    32. Pecan Pollen Negative    33. Pine mix Negative    34. Sycamore Eastern Negative    35. Walnut, Black Pollen Negative    36. Alternaria alternata Negative    37. Cladosporium Herbarum Negative    38. Aspergillus mix Negative    39. Penicillium mix Negative    40. Bipolaris sorokiniana (Helminthosporium) Negative    41. Drechslera spicifera (Curvularia) Negative    42. Mucor plumbeus Negative    43. Fusarium moniliforme Negative    44. Aureobasidium pullulans (pullulara) Negative    45. Rhizopus oryzae Negative    46. Botrytis cinera Negative    47. Epicoccum nigrum Negative    48. Phoma betae Negative    49. Candida Albicans Negative    50. Trichophyton mentagrophytes Negative    51. Mite, D Farinae  5,000 AU/ml Negative    52. Mite, D Pteronyssinus  5,000 AU/ml Negative    53. Cat Hair 10,000 BAU/ml Negative    54.  Dog Epithelia Negative    55. Mixed Feathers Negative    56. Horse Epithelia Negative    57. Cockroach, German Negative    58. Mouse Negative    59. Tobacco Leaf Negative              Intradermal - 05/07/21 1040     Time Antigen Placed 1020    Allergen Manufacturer Waynette Buttery    Location Arm    Number of Test 15    Intradermal Select    Control Negative    French Southern Territories Negative    Johnson Negative    7 Grass Negative    Ragweed mix 1+    Weed mix 1+    Tree mix Negative    Mold 1 Negative    Mold 2 Negative    Mold 3 2+    Mold 4 Negative    Cat Negative    Dog Negative    Cockroach Negative    Mite mix Negative             Allergy  testing results were read and interpreted by myself, documented by clinical staff.         Salvatore Marvel, MD [facility]

## 2021-05-08 ENCOUNTER — Telehealth: Payer: Self-pay | Admitting: Allergy & Immunology

## 2021-05-08 MED ORDER — IPRATROPIUM BROMIDE 0.03 % NA SOLN: 1.0000 | Freq: Three times a day (TID) | NASAL | 5 refills | Status: DC

## 2021-05-08 NOTE — Telephone Encounter (Signed)
Patient was seen yesterday, 05/07/21. He was prescribed Atrovent nasal spray, but said it was never sent into the pharmacy. [facility] on [address].

## 2021-05-08 NOTE — Addendum Note (Signed)
Addended by: Robet Leu A on: 05/08/2021 11:34 AM   Modules accepted: Orders

## 2021-05-15 ENCOUNTER — Telehealth: Payer: Self-pay | Admitting: Neurology

## 2021-05-15 NOTE — Telephone Encounter (Signed)
Called the patient back. There was no answer. LVM asking for a call back.   I do not see where Gabapentin is on current med list and was dc'd because of it being ineffective.  Called to find out if he has been taking Gabapentin, if so what dose and how often.  Pt has a upcoming apt in October that we may could move up sooner if he prefers to come in and discuss options.   **If pt calls back and I am unable to take the call, please clarify if he has been taking Gabapentin, what strength and how many times a day.  Can also offer to see if he would like to be seen sooner to discuss treatment

## 2021-05-15 NOTE — Telephone Encounter (Signed)
Pt called stating the Gabapentin is not helping him at all and does not know what he should do. Pt is requesting a call back.

## 2021-05-16 ENCOUNTER — Other Ambulatory Visit: Payer: Self-pay | Admitting: Neurology

## 2021-05-21 NOTE — Telephone Encounter (Signed)
No answer, left a voicemail asking him to call me back.  He did not complete his LFTs yet.  If patient calls back please remind him to come in for his monthly lab work and give him office hours.

## 2021-05-27 ENCOUNTER — Other Ambulatory Visit: Payer: Medicare Other

## 2021-05-27 ENCOUNTER — Other Ambulatory Visit: Payer: Self-pay

## 2021-05-31 ENCOUNTER — Ambulatory Visit (INDEPENDENT_AMBULATORY_CARE_PROVIDER_SITE_OTHER): Payer: Medicare Other | Admitting: Family

## 2021-05-31 ENCOUNTER — Telehealth: Payer: Self-pay | Admitting: Allergy & Immunology

## 2021-05-31 ENCOUNTER — Other Ambulatory Visit: Payer: Self-pay

## 2021-05-31 ENCOUNTER — Encounter: Payer: Self-pay | Admitting: Family

## 2021-05-31 DIAGNOSIS — R042 Hemoptysis: Secondary | ICD-10-CM

## 2021-05-31 DIAGNOSIS — J301 Allergic rhinitis due to pollen: Secondary | ICD-10-CM

## 2021-05-31 MED ORDER — CARBINOXAMINE MALEATE 4 MG PO TABS: ORAL_TABLET | ORAL | 2 refills | Status: DC

## 2021-05-31 MED ORDER — AZELASTINE HCL 0.1 % NA SOLN: NASAL | 3 refills | Status: DC

## 2021-05-31 MED ORDER — FLUTICASONE PROPIONATE 50 MCG/ACT NA SUSP: NASAL | 2 refills | Status: DC

## 2021-05-31 NOTE — Telephone Encounter (Signed)
Can we throw him onto a televisit with Chrissie today? We likely need a CXR.  Malachi Bonds, MD [facility]

## 2021-05-31 NOTE — Telephone Encounter (Signed)
I have tried calling the patient twice to tell him about the televisit @ 3 today with Chrissie.

## 2021-05-31 NOTE — Telephone Encounter (Signed)
Noted  

## 2021-05-31 NOTE — Telephone Encounter (Signed)
Dr. Dellis Anes please advise  Patient saw Dr. Dellis Anes 05/07/21. He states he is not any better and when he coughs now, he is coughing up blood with mucus. He scheduled an appointment with Dr. Dellis Anes, but could not get in until 06/21/19.  [phone]

## 2021-05-31 NOTE — Progress Notes (Signed)
RE: BURLIE CAJAMARCA MRN: [id number] DOB: May 05, 1956 Date of Telemedicine Visit: 05/31/2021  Referring provider: No ref. provider found Primary care provider: Patient, No Pcp Per (Inactive)  Chief Complaint: No chief complaint on file.   Telemedicine Follow Up Visit via Telephone: I connected with Jason Fila for a follow up on 05/31/21 by telephone and verified that I am speaking with the correct person using two identifiers.   I discussed the limitations, risks, security and privacy concerns of performing an evaluation and management service by telephone and the availability of in person appointments. I also discussed with the patient that there may be a patient responsible charge related to this service. The patient expressed understanding and agreed to proceed.  Patient is at work. Provider is at the office.  Visit start time: 3:10 PM Visit end time: 3:21 PM Insurance consent/check in by: Suanne Marker Medical consent and medical assistant/nurse: Zelphia Cairo.  History of Present Illness: He is a 65 y.o. male, who is being followed for seasonal allergic rhinitis due to pollen. His previous allergy office visit was on May 07, 2021 with Dr. Dellis Anes.   Seasonal allergic rhinitis due to pollen is reported as not well controlled with Singulair 10 mg at night, Mucinex at night, and Atrovent nasal spray.  He reports that he is out of levocetirizine.  He does not feel like levocetirizine helped.  He reports clear rhinorrhea, sneezing, nasal congestion at times, and postnasal drip.  He reports the postnasal drip causes him to cough.  He will feel a tickle in his throat and then will cough.  He has not had any sinus infections since we last saw him.  He reports that when he coughs that is causing him to spit up blood.  He saw his primary care physician the day before yesterday and has a chest x-ray ordered, but he has not had this done yet.  He reports that he is going to get this done Monday.   Instructed him to get this sooner if possible.  He reports a tightness/burning in his chest from the cough and denies fever, chills, wheezing, and shortness of breath.  He has not ever been diagnosed with asthma.  He denies heartburn or reflux symptoms.  Assessment and Plan: Kaiyden is a 65 y.o. male with: Patient Instructions  1. Seasonal allergic rhinitis due to pollen (ragweed, weeds, and outdoor molds) Stop Xyzal (levocetirizine) Stop Atrovent (ipratropium) nasal spray - Continue with: Singulair (montelukast) 10mg  daily - Start taking: Azelastine nasal spray using 1 spray each nostril twice a day as needed for drainage down throat -Start taking carbinoxamine 4 mg twice a day as needed for runny nose -Start fluticasone nasal spray using 1 to 2 sprays each nostril once a day as needed for stuffy nose. In the right nostril, point the applicator out toward the right ear. In the left nostril, point the applicator out toward the left ear - You can use an extra dose of the antihistamine, if needed, for breakthrough symptoms.  - Consider nasal saline rinses 1-2 times daily to remove allergens from the nasal cavities as well as help with mucous clearance (this is especially helpful to do before the nasal sprays are given)  2.  Hemoptysis -Get the chest x-ray as ordered by your primary care physician.  Please let know if this treatment plan is not working well for you.  Keep your already scheduled follow-up appointment with Dr. [place] on June 20, 2021 at 5 PM  Return in about 20 days (around 06/20/2021), or if symptoms worsen or fail to improve.  No orders of the defined types were placed in this encounter.  Lab Orders  No laboratory test(s) ordered today    Diagnostics: None.  Medication List:  Current Outpatient Medications  Medication Sig Dispense Refill   AUBAGIO 14 MG TABS Take 1 tablet by mouth daily. 90 tablet 3   DULoxetine (CYMBALTA) 30 MG capsule TAKE 1  CAPSULE BY MOUTH EVERY MORNING AFTER A MEAL 30 capsule 3   ipratropium (ATROVENT) 0.03 % nasal spray Place 1 spray into both nostrils 3 (three) times daily. 30 mL 5   levocetirizine (XYZAL) 5 MG tablet Take 5 mg by mouth daily as needed.     losartan-hydrochlorothiazide (HYZAAR) 50-12.5 MG tablet Take 1 tablet by mouth once daily 60 tablet 0   montelukast (SINGULAIR) 10 MG tablet Take 10 mg by mouth daily.     rosuvastatin (CRESTOR) 20 MG tablet 1/2 tab by mouth daily with evening meal 30 tablet 6   tiZANidine (ZANAFLEX) 2 MG tablet One po qAM and two po qHS 90 tablet 5   No current facility-administered medications for this visit.   Allergies: Allergies  Allergen Reactions   Lipitor [Atorvastatin Calcium] Other (See Comments)    Muscle pain, which he does not have with Rosuvastatin.   I reviewed his past medical history, social history, family history, and environmental history and no significant changes have been reported from previous visit on May 07, 2021.  Review of Systems  Constitutional:  Negative for chills and fever.  HENT:  Positive for congestion, postnasal drip, rhinorrhea and sneezing.        Reports sneezing, clear rhinorrhea, nasal congestion at times, and postnasal drip.   Eyes:        Reports a little bit of watery eyes and denies itchy eyes  Respiratory:  Positive for cough and chest tightness. Negative for shortness of breath and wheezing.        Reports a productive cough with hemoptysis.  He also reports a tightness/burning sensation in his chest from coughing.  He denies wheezing and shortness of breath.  Cardiovascular:        Reports pain in his chest from coughing.  Gastrointestinal:        Denies heartburn and reflux symptoms  Genitourinary:  Negative for frequency.  Skin:  Negative for rash.       Denies rashes and itchy skin  Allergic/Immunologic: Positive for environmental allergies.  Neurological:  Positive for headaches.       Reports headaches  from coughing   Objective: Physical Exam Not obtained as encounter was done via telephone.   Previous notes and tests were reviewed.  I discussed the assessment and treatment plan with the patient. The patient was provided an opportunity to ask questions and all were answered. The patient agreed with the plan and demonstrated an understanding of the instructions.   The patient was advised to call back or seek an in-person evaluation if the symptoms worsen or if the condition fails to improve as anticipated.  I provided 11 minutes of non-face-to-face time during this encounter.  It was my pleasure to participate in Johnmark Geiger care today. Please feel free to contact me with any questions or concerns.   Sincerely,  Nehemiah Settle, FNP [facility]

## 2021-05-31 NOTE — Telephone Encounter (Signed)
Thank you :)

## 2021-05-31 NOTE — Patient Instructions (Addendum)
1. Seasonal allergic rhinitis due to pollen (ragweed, weeds, and outdoor molds) Stop Xyzal (levocetirizine) Stop Atrovent (ipratropium) nasal spray - Continue with: Singulair (montelukast) 10mg  daily - Start taking: Azelastine nasal spray using 1 spray each nostril twice a day as needed for drainage down throat -Start taking carbinoxamine 4 mg twice a day as needed for runny nose -Start fluticasone nasal spray using 1 to 2 sprays each nostril once a day as needed for stuffy nose. In the right nostril, point the applicator out toward the right ear. In the left nostril, point the applicator out toward the left ear - You can use an extra dose of the antihistamine, if needed, for breakthrough symptoms.  - Consider nasal saline rinses 1-2 times daily to remove allergens from the nasal cavities as well as help with mucous clearance (this is especially helpful to do before the nasal sprays are given)  2.  Hemoptysis -Get the chest x-ray as ordered by your primary care physician.  Please let know if this treatment plan is not working well for you.  Keep your already scheduled follow-up appointment with Dr. [place] on June 20, 2021 at 5 PM

## 2021-05-31 NOTE — Telephone Encounter (Signed)
Patient saw Dr. Dellis Anes 05/07/21. He states he is not any better and when he coughs now, he is coughing up blood with mucus. He scheduled an appointment with Dr. Dellis Anes, but could not get in until 06/21/19.

## 2021-06-17 ENCOUNTER — Ambulatory Visit: Payer: Medicare Other | Admitting: Neurology

## 2021-06-17 ENCOUNTER — Other Ambulatory Visit (INDEPENDENT_AMBULATORY_CARE_PROVIDER_SITE_OTHER): Payer: Self-pay

## 2021-06-17 DIAGNOSIS — Z79899 Other long term (current) drug therapy: Secondary | ICD-10-CM

## 2021-06-17 DIAGNOSIS — G35 Multiple sclerosis: Secondary | ICD-10-CM

## 2021-06-17 DIAGNOSIS — Z0289 Encounter for other administrative examinations: Secondary | ICD-10-CM

## 2021-06-18 ENCOUNTER — Telehealth: Payer: Self-pay | Admitting: Neurology

## 2021-06-18 LAB — HEPATIC FUNCTION PANEL
ALT: 41 IU/L (ref 0–44)
AST: 27 IU/L (ref 0–40)
Albumin: 4.5 g/dL (ref 3.8–4.8)
Alkaline Phosphatase: 62 IU/L (ref 44–121)
Bilirubin Total: 0.3 mg/dL (ref 0.0–1.2)
Bilirubin, Direct: 0.12 mg/dL (ref 0.00–0.40)
Total Protein: 6.8 g/dL (ref 6.0–8.5)

## 2021-06-18 NOTE — Telephone Encounter (Signed)
-----   Message from Asa Lente, MD sent at 06/18/2021  8:52 AM EDT ----- Please let the patient know that the lab work is fine.

## 2021-06-18 NOTE — Telephone Encounter (Signed)
Called the patient and advised of the normal lab results. Pt verbalized understanding. Pt had no questions at this time but was encouraged to call back if questions arise.  

## 2021-06-20 ENCOUNTER — Ambulatory Visit: Payer: Medicare Other | Admitting: Neurology

## 2021-06-20 ENCOUNTER — Ambulatory Visit: Payer: Medicare Other | Admitting: Allergy & Immunology

## 2021-06-24 ENCOUNTER — Ambulatory Visit: Payer: Medicare Other | Admitting: Neurology

## 2021-06-28 ENCOUNTER — Telehealth: Payer: Self-pay | Admitting: Allergy & Immunology

## 2021-06-28 NOTE — Telephone Encounter (Signed)
Patient called to schedule a missed appointment. Patient stated he needs a new allergy regiment as he is still having trouble with sneezing, runny nose and coughing. I asked patient if he had been taking his carbinoxamine and singulair as prescribed and patient stated he was unaware of any tablet prescriptions being sent in by our office. I then asked patient if he had been able to use either nasal spray sent in by our office. Patient stated he was unable to use the nasal spray as it was broken when received and he has not been able to go return it to the pharmacy. Patient did not seem to be aware about which medications he is supposed to be taking.   [facility] - [address] [place] [address]   Best contact number: [phone]

## 2021-07-01 ENCOUNTER — Other Ambulatory Visit: Payer: Self-pay | Admitting: *Deleted

## 2021-07-01 MED ORDER — FLUTICASONE PROPIONATE 50 MCG/ACT NA SUSP: NASAL | 5 refills | Status: DC

## 2021-07-01 MED ORDER — MONTELUKAST SODIUM 10 MG PO TABS: 10.0000 mg | ORAL_TABLET | Freq: Every day | ORAL | 5 refills | Status: DC

## 2021-07-01 MED ORDER — AZELASTINE HCL 0.1 % NA SOLN: NASAL | 5 refills | Status: DC

## 2021-07-01 MED ORDER — CARBINOXAMINE MALEATE 4 MG PO TABS: ORAL_TABLET | ORAL | 5 refills | Status: DC

## 2021-07-01 NOTE — Telephone Encounter (Signed)
PA has been submitted through CoverMyMeds for Carbinoxamine under urgent review and is currently pending.

## 2021-07-01 NOTE — Telephone Encounter (Signed)
I spoke to the patient and the pharmacy, the pharmacy stated Carbinoxamine 4mg  isn't covered by his insurance and would need an alternative, the patient pick up the Azelastine nasal spray 06/03/21 and that's what's broken and the pharmacy stated he can't bring it back because of the time frame, they said you can pick up another one 07/08/21 and the insurance will cover it, I informed the patient of everything and he stated he understood. He hasn't gotten his chest X-ray but stated he will get it done tomorrow.   Patrick Gilbert [phone]

## 2021-07-01 NOTE — Telephone Encounter (Signed)
Thank you :)

## 2021-07-01 NOTE — Telephone Encounter (Signed)
Called and spoke with the patient and went over the plan per Chrissie. I have refilled his medications for him. Patient stated that another person from our office has called and spoke with him and would call him back. I advised I'm not sure who spoke with him but I am sure that they would call him back in regards to his nasal spray.

## 2021-07-01 NOTE — Telephone Encounter (Signed)
When I refilled the Carbinoxamine it stated that Xyzal and Zyrtec were preferred which he has already tried and failed. I am going to submit a PA for this now to see if we can get it approved.

## 2021-07-01 NOTE — Telephone Encounter (Signed)
Please find out what antihistamines he has tried in the past and what antihistamines are covered. He can buy azelastine over the counter. Please have him get the chest x-ray as ordered by his primary care physician

## 2021-07-01 NOTE — Telephone Encounter (Signed)
Patient called back stating no one called him back about this message and that he really needs his medication.

## 2021-07-02 ENCOUNTER — Ambulatory Visit
Admission: RE | Admit: 2021-07-02 | Discharge: 2021-07-02 | Disposition: A | Discharge status: Home / Self Care | Payer: Medicare Other | Source: Ambulatory Visit | Attending: Registered Nurse | Admitting: Registered Nurse

## 2021-07-02 ENCOUNTER — Other Ambulatory Visit: Payer: Self-pay | Admitting: Registered Nurse

## 2021-07-02 DIAGNOSIS — R059 Cough, unspecified: Secondary | ICD-10-CM

## 2021-07-02 NOTE — Telephone Encounter (Signed)
PA has been approved for Caribinxamine. Called patient and advised. Patient verbalized understanding. PA has been faxed to patients pharmacy, labeled, and placed in bulk scanning.

## 2021-07-22 ENCOUNTER — Other Ambulatory Visit (INDEPENDENT_AMBULATORY_CARE_PROVIDER_SITE_OTHER): Payer: Self-pay

## 2021-07-22 DIAGNOSIS — Z0289 Encounter for other administrative examinations: Secondary | ICD-10-CM

## 2021-07-22 DIAGNOSIS — Z79899 Other long term (current) drug therapy: Secondary | ICD-10-CM

## 2021-07-22 DIAGNOSIS — G35 Multiple sclerosis: Secondary | ICD-10-CM

## 2021-07-23 LAB — HEPATIC FUNCTION PANEL
ALT: 25 IU/L (ref 0–44)
AST: 21 IU/L (ref 0–40)
Albumin: 4.7 g/dL (ref 3.8–4.8)
Alkaline Phosphatase: 62 IU/L (ref 44–121)
Bilirubin Total: 0.5 mg/dL (ref 0.0–1.2)
Bilirubin, Direct: 0.15 mg/dL (ref 0.00–0.40)
Total Protein: 6.7 g/dL (ref 6.0–8.5)

## 2021-07-24 ENCOUNTER — Telehealth: Payer: Self-pay

## 2021-07-24 NOTE — Telephone Encounter (Signed)
Called and spoke to pt of his results per Dr. Epimenio Foot. Pt verbalized understanding and had no questions at this time.

## 2021-07-24 NOTE — Telephone Encounter (Signed)
-----   Message from Asa Lente, MD sent at 07/23/2021 10:10 AM EST ----- Please let the patient know that the lab work is fine.

## 2021-07-24 NOTE — Progress Notes (Signed)
See telephone note from 07/24/21.

## 2021-08-30 ENCOUNTER — Other Ambulatory Visit: Payer: Self-pay | Admitting: Family

## 2021-09-01 NOTE — Telephone Encounter (Signed)
Ok to refill fluticasone 1-2 sprays each nostril once a day as needed for stuffy nose.

## 2021-09-18 ENCOUNTER — Other Ambulatory Visit: Payer: Self-pay

## 2021-09-18 ENCOUNTER — Ambulatory Visit (INDEPENDENT_AMBULATORY_CARE_PROVIDER_SITE_OTHER): Payer: Commercial Managed Care - HMO | Admitting: Neurology

## 2021-09-18 ENCOUNTER — Encounter: Payer: Self-pay | Admitting: Neurology

## 2021-09-18 VITALS — BP 136/83 | HR 80 | Ht 66.0 in | Wt 200.5 lb

## 2021-09-18 DIAGNOSIS — R269 Unspecified abnormalities of gait and mobility: Secondary | ICD-10-CM

## 2021-09-18 DIAGNOSIS — Z79899 Other long term (current) drug therapy: Secondary | ICD-10-CM | POA: Diagnosis not present

## 2021-09-18 DIAGNOSIS — M79605 Pain in left leg: Secondary | ICD-10-CM | POA: Diagnosis not present

## 2021-09-18 DIAGNOSIS — G35 Multiple sclerosis: Secondary | ICD-10-CM

## 2021-09-18 MED ORDER — ETODOLAC 400 MG PO TABS: 400.0000 mg | ORAL_TABLET | Freq: Two times a day (BID) | ORAL | 5 refills | Status: DC

## 2021-09-18 NOTE — Progress Notes (Signed)
[facility]  PATIENT: Patrick Gilbert DOB: 1955-12-16  REFERRING DOCTOR OR PCP:   Norva Riffle PA SOURCE: Patient, notes from primary care  _________________________________   HISTORICAL  CHIEF COMPLAINT:  Chief Complaint  Patient presents with   Follow-up    RM 2, alone. Last seen 12/13/20. Ms-on Aubagio    HISTORY OF PRESENT ILLNESS:  Patrick Gilbert is a 66 year old man who has had many years of left leg numbness.     Update 09/18/2021: He is on Aubagio and tolerates it well.    He has not had any new MS symptoms since starting them.       He has left upper thigh pain, seems more in the muscle.  Squeezing the muscle hurts.    In 2022, he had MRI of the cervical spine and brain.  The MRI of the cervical spine shows 2 lesions, 1 posterolaterally to the left adjacent to C5-C6 and another centrally adjacent to T2.  There are degenerative changes but no significant spinal stenosis.  Repeat MRI of the brain 11/15/2020 showed multiple lesions predominantly in the hemispheres but also in the right cerebellar hemisphere consistent with MS.  It is likely that some of the foci are chronic microvascular ischemic change as well.  Compared to the previous MRI from 2019, there are a few new lesions.  Clinically, he notes that his gait is mildly wide and off balance.  He stumbles some but has not fallen.  He needs to use the banister going up and down stairs.   He continues to have some pain into the arms, left greater than right.  He has stiffness in the left leg and sometimes the left had.   Left leg is mildly weak.    He does not note weakness in the arms.   Gabapentin has helped the tingling .   Tiizanidine slightly helps the muscle stiffness bur made him sleepy.     Diclofenac 50 mg po bid only mildly helped the pain.       Imaging review: MRI brain 11/15/2020 shows T2/FLAIR hyperintense foci in the periventricular, juxtacortical, subcortical and deep white matter both cerebral  hemispheres and the right cerebellar hemisphere.  Most of the foci are nonspecific though some foci are radially oriented to the ventricles in the periventricular white matter and in the juxtacortical white matter which could be more consistent with chronic demyelinating plaque.  There is likely superimposed chronic microvascular ischemic change.  None of the foci were acute or enhanced.  However, a couple small foci in the cerebral hemispheres were not readily apparent on the 2019 MRI.    MRI of the cervical spine 10/28/2020 shows a T2 hyperintense focus posteriorly adjacent to C5-C6 noted on the axial images and the sagittal STIR images but not on the sagittal T2-weighted images.  This could represents a focus of demyelination or less likely ischemic change.  Artifact cannot be ruled out as it was not present on all of the T2-weighted images.  Has mild spinal stenosis C4-C5 to C6-C7.  MRI lumbar 10/28/2020 shows At L2-L3, there are degenerative changes causing mild spinal stenosis but no nerve root compression.  At L3-L4, there are degenerative changes causing mild spinal stenosis and moderately severe left lateral recess stenosis and moderate right lateral recess stenosis.  There is potential for left L4 nerve root compression.  REVIEW OF SYSTEMS: Constitutional: No fevers, chills, sweats, or change in appetite Eyes: No visual changes, double vision, eye pain Ear,  nose and throat: No hearing loss, ear pain, nasal congestion, sore throat Cardiovascular: No chest pain, palpitations Respiratory:  No shortness of breath at rest or with exertion.   No wheezes GastrointestinaI: No nausea, vomiting, diarrhea, abdominal pain, fecal incontinence Genitourinary:  No dysuria, urinary retention or frequency.  No nocturia. Musculoskeletal: As above  integumentary: No rash, pruritus, skin lesions Neurological: as above Psychiatric: No depression at this time.  No anxiety Endocrine: No palpitations, diaphoresis,  change in appetite, change in weigh or increased thirst Hematologic/Lymphatic:  No anemia, purpura, petechiae. Allergic/Immunologic: No itchy/runny eyes, nasal congestion, recent allergic reactions, rashes  ALLERGIES: Allergies  Allergen Reactions   Lipitor [Atorvastatin Calcium] Other (See Comments)    Muscle pain, which he does not have with Rosuvastatin.    HOME MEDICATIONS:  Current Outpatient Medications:    AUBAGIO 14 MG TABS, Take 1 tablet by mouth daily., Disp: 90 tablet, Rfl: 3   azelastine (ASTELIN) 0.1 % nasal spray, Place 1 spray in each nostril twice a day as needed for runny nose/drainage down throat, Disp: 30 mL, Rfl: 5   Carbinoxamine Maleate 4 MG TABS, Take 1 tablet twice a day as needed for runny nose, Disp: 60 tablet, Rfl: 5   DULoxetine (CYMBALTA) 30 MG capsule, TAKE 1 CAPSULE BY MOUTH EVERY MORNING AFTER A MEAL, Disp: 30 capsule, Rfl: 3   fluticasone (FLONASE) 50 MCG/ACT nasal spray, SHAKE LIQUID AND USE 2 SPRAYS IN EACH NOSTRIL EVERY DAY AS NEEDED FOR NASAL CONGESTION, Disp: 16 g, Rfl: 5   losartan-hydrochlorothiazide (HYZAAR) 50-12.5 MG tablet, Take 1 tablet by mouth once daily, Disp: 60 tablet, Rfl: 0   montelukast (SINGULAIR) 10 MG tablet, Take 1 tablet (10 mg total) by mouth daily., Disp: 30 tablet, Rfl: 5   rosuvastatin (CRESTOR) 20 MG tablet, 1/2 tab by mouth daily with evening meal, Disp: 30 tablet, Rfl: 6   tiZANidine (ZANAFLEX) 2 MG tablet, One po qAM and two po qHS, Disp: 90 tablet, Rfl: 5  PAST MEDICAL HISTORY: Past Medical History:  Diagnosis Date   Headache 03/2016   High blood pressure    High cholesterol     PAST SURGICAL HISTORY: Past Surgical History:  Procedure Laterality Date   Repair of left shoulder laceration     cut with broken glass bottle.  Missed a major artery by just a small measurement per patient    FAMILY HISTORY: Family History  Problem Relation Age of Onset   Hypertension Mother    Heart disease Mother        unknown  open heart surgery   Hypertension Father    Peripheral vascular disease Father    Heart disease Brother        Unknown heart issue   Seizures Daughter     SOCIAL HISTORY:  Social History   Socioeconomic History   Marital status: Single    Spouse name: Not on file   Number of children: 2   Years of education: 11   Highest education level: 11th grade  Occupational History   Occupation: Estate manager/land agent job  Tobacco Use   Smoking status: Every Day    Packs/day: 0.50    Years: 48.00    Pack years: 24.00    Types: Cigarettes   Smokeless tobacco: Former  Building services engineer Use: Never used  Substance and Sexual Activity   Alcohol use: No    Comment: History of drinking too much.  Stopped at age 89 yo   Drug use: Not Currently  Frequency: 1.0 times per week    Types: Marijuana    Comment: Now and then; history of crack cocaine   Sexual activity: Not on file  Other Topics Concern   Not on file  Social History Narrative   Lives alone in an apartment on the 10th floor.  Has 3 children but one is deceased.  Works at an Engineer, maintenance.  Education: 11th grade.   Coffee sometimes and MT Dew   Social Determinants of Health   Financial Resource Strain: Not on file  Food Insecurity: Not on file  Transportation Needs: Not on file  Physical Activity: Not on file  Stress: Not on file  Social Connections: Not on file  Intimate Partner Violence: Not on file     PHYSICAL EXAM  Vitals:   09/18/21 1241  BP: 136/83  Pulse: 80  Weight: 200 lb 8 oz (90.9 kg)  Height: 5\' 6"  (1.676 m)    Body mass index is 32.36 kg/m.   General: The patient is well-developed and well-nourished and in no acute distress  HEENT:  Head is [place]/AT.  Sclera are anicteric.     Neck:  .  The neck is nontender.   Skin: Extremities are without rash or  edema.  Neurologic Exam  Mental status: The patient is alert and oriented x 3 at the time of the examination. The patient has apparent normal recent  and remote memory, with an apparently normal attention span and concentration ability.   Speech is normal.  Cranial nerves: Extraocular movements are full.  Facial symmetry is present. There is good facial sensation to soft touch bilaterally.Facial strength is normal.  Trapezius and sternocleidomastoid strength is normal. No dysarthria is noted.    No obvious hearing deficits are noted.  Motor:  Muscle bulk is normal.   Tone is mildly increased in the left leg. Strength is  5 / 5 in all 4 extremities.   Sensory: Sensory testing is intact to pinprick, soft touch and vibration sensation in the right arm and leg.  He reported mildly reduced sensation to touch in the C7 distribution of the left hand.  Some reduced sensation near the ankle on the left to touch, symmetric sensation to vibration.  Coordination: Cerebellar testing reveals good finger-nose-finger and heel-to-shin bilaterally.  Gait and station: Station is normal.  Gait is mildly spastic, left greater than right. Tandem gait is wide. Romberg is negative.   Reflexes: Deep tendon reflexes are symmetric and normal bilaterally.        DIAGNOSTIC DATA (LABS, IMAGING, TESTING) - I reviewed patient records, labs, notes, testing and imaging myself where available.  Lab Results  Component Value Date   WBC 6.1 12/13/2020   HGB 15.9 12/13/2020   HCT 47.3 12/13/2020   MCV 85 12/13/2020   PLT 235 12/13/2020      Component Value Date/Time   NA 141 12/13/2020 1407   K 4.0 12/13/2020 1407   CL 103 12/13/2020 1407   CO2 19 (L) 12/13/2020 1407   GLUCOSE 92 12/13/2020 1407   GLUCOSE 93 04/22/2019 1252   BUN 11 12/13/2020 1407   CREATININE 0.96 12/13/2020 1407   CALCIUM 9.9 12/13/2020 1407   PROT 6.7 07/22/2021 1231   ALBUMIN 4.7 07/22/2021 1231   AST 21 07/22/2021 1231   ALT 25 07/22/2021 1231   ALKPHOS 62 07/22/2021 1231   BILITOT 0.5 07/22/2021 1231   GFRNONAA >60 04/22/2019 1252   GFRAA >60 04/22/2019 1252   Lab Results   Component  Value Date   CHOL 143 10/11/2018   HDL 44 10/11/2018   LDLCALC 77 10/11/2018   TRIG 110 10/11/2018   CHOLHDL 5.3 03/28/2016   Lab Results  Component Value Date   HGBA1C 5.9 (H) 03/27/2016   No results found for: VITAMINB12 Lab Results  Component Value Date   TSH 0.996 03/27/2016       ASSESSMENT AND PLAN  Multiple sclerosis (HCC)  High risk medication use  Gait disturbance  Pain of left lower extremity   1.  Continue Aubago.  Recent LFT was normal   2.   Etodolac 400 mg po bid   if not better, try baclofen.    3.   Stay active and exercise as tolerated. 4.   Return in 5-6 months or sooner if new or worsening neurologic symptoms.    Domonik Levario A. Epimenio Foot, MD, [facility] 09/18/2021, 1:02 PM Certified in Neurology, Clinical Neurophysiology, Sleep Medicine and Neuroimaging  [facility] [address] [place], [place] [address] [phone]

## 2022-04-01 ENCOUNTER — Telehealth: Payer: Self-pay | Admitting: Neurology

## 2022-04-01 NOTE — Telephone Encounter (Signed)
Called the patient back. He states that he has been having these aches/pains in lower extremities. He has been taking the etodoloac and tizanidine and not getting much relief. She states he can be standing and shooting pain from hip into thigh area. Sometimes there is decreased sensation and numbness in lower extremities. The patient is due for a 6 mth follow up and was able to get him scheduled at 3 pm 7/19

## 2022-04-01 NOTE — Telephone Encounter (Signed)
Pt complaining of legs aching while walking. Everyday legs are aching while in bed, walking, working. Have been taking medication as directed, sometimes relief and sometimes do not work. Have contacted PCP they are working on trying to find an appt. Would like a call from the nurse.

## 2022-04-02 ENCOUNTER — Encounter: Payer: Self-pay | Admitting: Neurology

## 2022-04-02 ENCOUNTER — Ambulatory Visit (INDEPENDENT_AMBULATORY_CARE_PROVIDER_SITE_OTHER): Payer: Medicare Other | Admitting: Neurology

## 2022-04-02 VITALS — BP 127/85 | HR 90 | Ht 66.0 in | Wt 207.5 lb

## 2022-04-02 DIAGNOSIS — Z79899 Other long term (current) drug therapy: Secondary | ICD-10-CM | POA: Diagnosis not present

## 2022-04-02 DIAGNOSIS — G35 Multiple sclerosis: Secondary | ICD-10-CM

## 2022-04-02 DIAGNOSIS — M79605 Pain in left leg: Secondary | ICD-10-CM | POA: Diagnosis not present

## 2022-04-02 DIAGNOSIS — R269 Unspecified abnormalities of gait and mobility: Secondary | ICD-10-CM | POA: Diagnosis not present

## 2022-04-02 DIAGNOSIS — M5412 Radiculopathy, cervical region: Secondary | ICD-10-CM

## 2022-04-02 MED ORDER — NAPROXEN 500 MG PO TBEC
500.0000 mg | DELAYED_RELEASE_TABLET | Freq: Two times a day (BID) | ORAL | 5 refills | Status: DC
Start: 2022-04-02 — End: 2022-10-21

## 2022-04-02 MED ORDER — BACLOFEN 10 MG PO TABS: ORAL_TABLET | ORAL | 5 refills | Status: DC

## 2022-04-02 MED ORDER — METHYLPREDNISOLONE 4 MG PO TABS: ORAL_TABLET | ORAL | 0 refills | Status: DC

## 2022-04-02 NOTE — Progress Notes (Addendum)
[facility]  PATIENT: Patrick Gilbert DOB: November 04, 1955  REFERRING DOCTOR OR PCP:   Norva Riffle PA SOURCE: Patient, notes from primary care  _________________________________   HISTORICAL  CHIEF COMPLAINT:  Chief Complaint  Patient presents with   Follow-up    Rm 1, alone. Here to f/u for pain and aches in lower extremities. Having shooting pn in legs. Has had no improvements with etodolac. On Aubagio for MS and tolerating well.      HISTORY OF PRESENT ILLNESS:  Patrick Gilbert is a 66 year old man who has had many years of left leg numbness.     Update 04/02/2022 He is on Aubagio and tolerates it well.    He has not had any new MS symptoms since starting the medication.      He continues to report pain in his legs, left > right, mostly in his thighs  Pain is worse after exercise.    He notes tenderness to dep palpation of thighs.  Pain is achy.   His gait is doing the same.    He notes that his gait is mildly wide and off balance.  He stumbles some but has not fallen.  He needs to use the banister going up and down stairs.   He continues to have some pain into the arms, left greater than right.  He has stiffness in the left leg and sometimes the left had.   Left leg is mildly weak.    He does not note weakness in the arms.   Gabapentin has helped the tingling .   Tiizanidine slightly helps the muscle stiffness bur made him sleepy.     Diclofenac 50 mg po bid only mildly helped the pain.       MS history:  Due to gait disturbance, pain and numbness, in 2022, he had MRI of the cervical spine and brain.  The MRI of the spine shows 2 lesions, 1 posterolaterally to the left adjacent to C5-C6 and another centrally adjacent to T2.  There are degenerative changes but no significant spinal stenosis.  Repeat MRI of the brain 11/15/2020 showed multiple lesions predominantly in the hemispheres but also in the right cerebellar hemisphere consistent with MS.  It is likely that some  of the foci are chronic microvascular ischemic change as well.  Compared to the previous MRI from 2019, there are a few new lesions.    Imaging review: MRI brain 11/15/2020 shows T2/FLAIR hyperintense foci in the periventricular, juxtacortical, subcortical and deep white matter both cerebral hemispheres and the right cerebellar hemisphere.  Most of the foci are nonspecific though some foci are radially oriented to the ventricles in the periventricular white matter and in the juxtacortical white matter which could be more consistent with chronic demyelinating plaque.  There is likely superimposed chronic microvascular ischemic change.  None of the foci were acute or enhanced.  However, a couple small foci in the cerebral hemispheres were not readily apparent on the 2019 MRI.    MRI of the cervical spine 10/28/2020 shows a T2 hyperintense focus posteriorly adjacent to C5-C6 noted on the axial images and the sagittal STIR images but not on the sagittal T2-weighted images.  This could represents a focus of demyelination or less likely ischemic change.  Artifact cannot be ruled out as it was not present on all of the T2-weighted images.  Has mild spinal stenosis C4-C5 to C6-C7.  MRI lumbar 10/28/2020 shows At L2-L3, there are degenerative changes causing mild  spinal stenosis but no nerve root compression.  At L3-L4, there are degenerative changes causing mild spinal stenosis and moderately severe left lateral recess stenosis and moderate right lateral recess stenosis.  There is potential for left L4 nerve root compression.  REVIEW OF SYSTEMS: Constitutional: No fevers, chills, sweats, or change in appetite Eyes: No visual changes, double vision, eye pain Ear, nose and throat: No hearing loss, ear pain, nasal congestion, sore throat Cardiovascular: No chest pain, palpitations Respiratory:  No shortness of breath at rest or with exertion.   No wheezes GastrointestinaI: No nausea, vomiting, diarrhea, abdominal  pain, fecal incontinence Genitourinary:  No dysuria, urinary retention or frequency.  No nocturia. Musculoskeletal: As above  integumentary: No rash, pruritus, skin lesions Neurological: as above Psychiatric: No depression at this time.  No anxiety Endocrine: No palpitations, diaphoresis, change in appetite, change in weigh or increased thirst Hematologic/Lymphatic:  No anemia, purpura, petechiae. Allergic/Immunologic: No itchy/runny eyes, nasal congestion, recent allergic reactions, rashes  ALLERGIES: Allergies  Allergen Reactions   Lipitor [Atorvastatin Calcium] Other (See Comments)    Muscle pain, which he does not have with Rosuvastatin.    HOME MEDICATIONS:  Current Outpatient Medications:    AUBAGIO 14 MG TABS, Take 1 tablet by mouth daily., Disp: 90 tablet, Rfl: 3   azelastine (ASTELIN) 0.1 % nasal spray, Place 1 spray in each nostril twice a day as needed for runny nose/drainage down throat, Disp: 30 mL, Rfl: 5   baclofen (LIORESAL) 10 MG tablet, 1/2 or 1 pill po tid for spasticity, Disp: 90 each, Rfl: 5   Carbinoxamine Maleate 4 MG TABS, Take 1 tablet twice a day as needed for runny nose, Disp: 60 tablet, Rfl: 5   DULoxetine (CYMBALTA) 30 MG capsule, TAKE 1 CAPSULE BY MOUTH EVERY MORNING AFTER A MEAL, Disp: 30 capsule, Rfl: 3   fluticasone (FLONASE) 50 MCG/ACT nasal spray, SHAKE LIQUID AND USE 2 SPRAYS IN EACH NOSTRIL EVERY DAY AS NEEDED FOR NASAL CONGESTION, Disp: 16 g, Rfl: 5   losartan-hydrochlorothiazide (HYZAAR) 50-12.5 MG tablet, Take 1 tablet by mouth once daily, Disp: 60 tablet, Rfl: 0   methylPREDNISolone (MEDROL) 4 MG tablet, Taper from 6 pills po for one day to 1 pill po the last day over 6 days, Disp: 21 tablet, Rfl: 0   montelukast (SINGULAIR) 10 MG tablet, Take 1 tablet (10 mg total) by mouth daily., Disp: 30 tablet, Rfl: 5   naproxen (EC-NAPROXEN) 500 MG EC tablet, Take 1 tablet (500 mg total) by mouth 2 (two) times daily with a meal., Disp: 60 tablet, Rfl: 5    rosuvastatin (CRESTOR) 20 MG tablet, 1/2 tab by mouth daily with evening meal, Disp: 30 tablet, Rfl: 6  PAST MEDICAL HISTORY: Past Medical History:  Diagnosis Date   Headache 03/2016   High blood pressure    High cholesterol     PAST SURGICAL HISTORY: Past Surgical History:  Procedure Laterality Date   Repair of left shoulder laceration     cut with broken glass bottle.  Missed a major artery by just a small measurement per patient    FAMILY HISTORY: Family History  Problem Relation Age of Onset   Hypertension Mother    Heart disease Mother        unknown open heart surgery   Hypertension Father    Peripheral vascular disease Father    Heart disease Brother        Unknown heart issue   Seizures Daughter     SOCIAL HISTORY:  Social History   Socioeconomic History   Marital status: Single    Spouse name: Not on file   Number of children: 2   Years of education: 11   Highest education level: 11th grade  Occupational History   Occupation: Estate manager/land agent job  Tobacco Use   Smoking status: Every Day    Packs/day: 0.50    Years: 48.00    Total pack years: 24.00    Types: Cigarettes   Smokeless tobacco: Former  Building services engineer Use: Never used  Substance and Sexual Activity   Alcohol use: No    Comment: History of drinking too much.  Stopped at age 32 yo   Drug use: Not Currently    Frequency: 1.0 times per week    Types: Marijuana    Comment: Now and then; history of crack cocaine   Sexual activity: Not on file  Other Topics Concern   Not on file  Social History Narrative   Lives alone in an apartment on the 10th floor.  Has 3 children but one is deceased.  Works at an Engineer, maintenance.  Education: 11th grade.   Coffee sometimes and MT Dew   Social Determinants of Health   Financial Resource Strain: Not on file  Food Insecurity: No Food Insecurity (07/27/2018)   Hunger Vital Sign    Worried About Running Out of Food in the Last Year: Never true    Ran Out  of Food in the Last Year: Never true  Transportation Needs: No Transportation Needs (07/27/2018)   PRAPARE - Administrator, Civil Service (Medical): No    Lack of Transportation (Non-Medical): No  Physical Activity: Not on file  Stress: No Stress Concern Present (07/27/2018)   [employer] of Occupational Health - Occupational Stress Questionnaire    Feeling of Stress : Not at all  Social Connections: Unknown (07/27/2018)   Social Connection and Isolation Panel [NHANES]    Frequency of Communication with Friends and Family: Not on file    Frequency of Social Gatherings with Friends and Family: Not on file    Attends Religious Services: 1 to 4 times per year    Active Member of [employer] or Organizations: No    Attends Banker Meetings: Never    Marital Status: Not on file  Intimate Partner Violence: Not on file     PHYSICAL EXAM  Vitals:   04/02/22 1458  BP: 127/85  Pulse: 90  Weight: 207 lb 8 oz (94.1 kg)  Height: 5\' 6"  (1.676 m)    Body mass index is 33.49 kg/m.   General: The patient is well-developed and well-nourished and in no acute distress muscles are tender in the thighs to deep palpation but not in the calves or shoulders  HEENT:  Head is [place]/AT.  Sclera are anicteric.     Neck:  .  The neck is nontender.   Skin: Extremities are without rash or  edema.  Neurologic Exam  Mental status: The patient is alert and oriented x 3 at the time of the examination. The patient has apparent normal recent and remote memory, with an apparently normal attention span and concentration ability.   Speech is normal.  Cranial nerves: Extraocular movements are full.  Facial symmetry is present.  Facial strength was normal.  No dysarthria is noted.    No obvious hearing deficits are noted.  Motor:  Muscle bulk is normal.   Tone is mildly increased in the left leg. Strength  is  4+/5 in iliopsoas and 5 / 5 elsewhere   Sensory: Sensory testing is intact to  pinprick, soft touch and vibration sensation in the right arm and leg.  He reported mildly reduced sensation to touch in the C7 distribution of the left hand.  Some reduced sensation near the ankle on the left to touch, symmetric sensation to vibration.  Coordination: Cerebellar testing reveals good finger-nose-finger and heel-to-shin bilaterally.  Gait and station: Station is normal.  Gait is mildly spastic, left greater than right.  His tandem gait is wide.. Romberg is negative.   Reflexes: Deep tendon reflexes are symmetric and normal bilaterally.        DIAGNOSTIC DATA (LABS, IMAGING, TESTING) - I reviewed patient records, labs, notes, testing and imaging myself where available.  Lab Results  Component Value Date   WBC 6.1 12/13/2020   HGB 15.9 12/13/2020   HCT 47.3 12/13/2020   MCV 85 12/13/2020   PLT 235 12/13/2020      Component Value Date/Time   NA 141 12/13/2020 1407   K 4.0 12/13/2020 1407   CL 103 12/13/2020 1407   CO2 19 (L) 12/13/2020 1407   GLUCOSE 92 12/13/2020 1407   GLUCOSE 93 04/22/2019 1252   BUN 11 12/13/2020 1407   CREATININE 0.96 12/13/2020 1407   CALCIUM 9.9 12/13/2020 1407   PROT 6.7 07/22/2021 1231   ALBUMIN 4.7 07/22/2021 1231   AST 21 07/22/2021 1231   ALT 25 07/22/2021 1231   ALKPHOS 62 07/22/2021 1231   BILITOT 0.5 07/22/2021 1231   GFRNONAA >60 04/22/2019 1252   GFRAA >60 04/22/2019 1252   Lab Results  Component Value Date   CHOL 143 10/11/2018   HDL 44 10/11/2018   LDLCALC 77 10/11/2018   TRIG 110 10/11/2018   CHOLHDL 5.3 03/28/2016   Lab Results  Component Value Date   HGBA1C 5.9 (H) 03/27/2016   No results found for: "VITAMINB12" Lab Results  Component Value Date   TSH 0.996 03/27/2016       ASSESSMENT AND PLAN  Multiple sclerosis (HCC) - Plan: CK, Hepatic function panel, CBC with Differential/Platelet  High risk medication use - Plan: CK, Hepatic function panel, CBC with Differential/Platelet  Pain of left lower  extremity  Gait disturbance  Cervical radiculopathy   1.  Continue Aubago.  Recent LFT, CBC with differential and creatinine kinase 2.   Naproxen 500 mg twice daily and baclofen for his pain and muscle spasms  3.   Stay active and exercise as tolerated. 4.   Return in 5-6 months or sooner if new or worsening neurologic symptoms.    Braeson Rupe A. Epimenio Foot, MD, [facility] 04/02/2022, 6:48 PM Certified in Neurology, Clinical Neurophysiology, Sleep Medicine and Neuroimaging  [facility] [address] [place], [place] [address] [phone]

## 2022-04-03 ENCOUNTER — Other Ambulatory Visit: Payer: Self-pay | Admitting: *Deleted

## 2022-04-03 ENCOUNTER — Telehealth: Payer: Self-pay | Admitting: *Deleted

## 2022-04-03 DIAGNOSIS — G35 Multiple sclerosis: Secondary | ICD-10-CM

## 2022-04-03 DIAGNOSIS — M79605 Pain in left leg: Secondary | ICD-10-CM

## 2022-04-03 DIAGNOSIS — Z79899 Other long term (current) drug therapy: Secondary | ICD-10-CM

## 2022-04-03 LAB — CBC WITH DIFFERENTIAL/PLATELET
Basophils Absolute: 0 10*3/uL (ref 0.0–0.2)
Basos: 1 %
EOS (ABSOLUTE): 0.2 10*3/uL (ref 0.0–0.4)
Eos: 5 %
Hematocrit: 45.8 % (ref 37.5–51.0)
Hemoglobin: 15.5 g/dL (ref 13.0–17.7)
Immature Grans (Abs): 0 10*3/uL (ref 0.0–0.1)
Immature Granulocytes: 1 %
Lymphocytes Absolute: 1.6 10*3/uL (ref 0.7–3.1)
Lymphs: 37 %
MCH: 28.7 pg (ref 26.6–33.0)
MCHC: 33.8 g/dL (ref 31.5–35.7)
MCV: 85 fL (ref 79–97)
Monocytes Absolute: 0.7 10*3/uL (ref 0.1–0.9)
Monocytes: 15 %
Neutrophils Absolute: 1.8 10*3/uL (ref 1.4–7.0)
Neutrophils: 41 %
Platelets: 154 10*3/uL (ref 150–450)
RBC: 5.4 x10E6/uL (ref 4.14–5.80)
RDW: 13.2 % (ref 11.6–15.4)
WBC: 4.4 10*3/uL (ref 3.4–10.8)

## 2022-04-03 LAB — HEPATIC FUNCTION PANEL
ALT: 21 IU/L (ref 0–44)
AST: 20 IU/L (ref 0–40)
Albumin: 4.5 g/dL (ref 3.9–4.9)
Alkaline Phosphatase: 51 IU/L (ref 44–121)
Bilirubin Total: 0.6 mg/dL (ref 0.0–1.2)
Bilirubin, Direct: 0.19 mg/dL (ref 0.00–0.40)
Total Protein: 6.6 g/dL (ref 6.0–8.5)

## 2022-04-03 LAB — CK: Total CK: 660 U/L (ref 41–331)

## 2022-04-03 NOTE — Telephone Encounter (Signed)
-----   Message from Asa Lente, MD sent at 04/03/2022  7:12 AM EDT ----- Please let him know the one lab that looks for muscle breakdown was elevated.  We need to recheck in 2-3 weeks   (future:  CK and also check TSH)

## 2022-04-03 NOTE — Telephone Encounter (Signed)
Called and spoke w/ pt. Relayed results per Dr. Bonnita Hollow note. Scheduled repeat lab draw for 04/16/22 at 4pm (he asked for afternoon appt later in the day). I placed future labs orders as well.

## 2022-04-16 ENCOUNTER — Other Ambulatory Visit: Payer: Medicare Other

## 2022-04-16 DIAGNOSIS — Z79899 Other long term (current) drug therapy: Secondary | ICD-10-CM

## 2022-04-16 DIAGNOSIS — G35 Multiple sclerosis: Secondary | ICD-10-CM

## 2022-04-16 DIAGNOSIS — Z0289 Encounter for other administrative examinations: Secondary | ICD-10-CM

## 2022-05-01 ENCOUNTER — Telehealth: Payer: Self-pay | Admitting: Neurology

## 2022-05-01 DIAGNOSIS — G35 Multiple sclerosis: Secondary | ICD-10-CM

## 2022-05-01 DIAGNOSIS — R200 Anesthesia of skin: Secondary | ICD-10-CM

## 2022-05-01 DIAGNOSIS — M5412 Radiculopathy, cervical region: Secondary | ICD-10-CM

## 2022-05-01 DIAGNOSIS — M79605 Pain in left leg: Secondary | ICD-10-CM

## 2022-05-01 DIAGNOSIS — R269 Unspecified abnormalities of gait and mobility: Secondary | ICD-10-CM

## 2022-05-01 DIAGNOSIS — M4807 Spinal stenosis, lumbosacral region: Secondary | ICD-10-CM

## 2022-05-01 NOTE — Telephone Encounter (Signed)
I reviewed chart. Pt was last seen on 04/02/2022 and pain/muscle spasms were discussed.  Plan was to start Naproxen 500 mg twice daily and baclofen for his pain and muscle spasms and d/c etodolac and tizanidine.   Pt reports he started the new meds on 04/03/2022 and is currently taking 500 mg Naproxen twice daily and baclofen 10 mg tid with no benefit. Pain in his legs continues to be 10/10 on the pain scale.   He is requesting MD to review and provide new recommendations for this.

## 2022-05-01 NOTE — Telephone Encounter (Signed)
Pt called stating that he is still in a lot of pain and would like to discuss with RN. Please advise.

## 2022-05-02 ENCOUNTER — Other Ambulatory Visit: Payer: Self-pay | Admitting: Family

## 2022-05-03 NOTE — Telephone Encounter (Signed)
May give a 30 day courtesy refill. He needs to schedule a follow up appointment for further refills.

## 2022-05-05 NOTE — Telephone Encounter (Signed)
Pt decided on referral to pain management clinic. Would like a call from the nurse.

## 2022-05-05 NOTE — Telephone Encounter (Signed)
Called the pt back to discuss further. After further discussion, he is wanting to try the epidural steroid injection first and then if that doesn't work he will contact us and let us know if we need to at that point place the referral to pain management. Advised the order will be placed and someone should be in touch.

## 2022-05-05 NOTE — Addendum Note (Signed)
Addended by: Judi Cong on: 05/05/2022 09:28 AM   Modules accepted: Orders

## 2022-05-05 NOTE — Telephone Encounter (Signed)
Attempted to call the patient back there was no answer and no VM set up.  **If pt calls back please advise that we discussed concerns with Dr Epimenio Foot and he feels that since he has not had relief with medications we could do couple of options. We could send him to [facility] where they can provide epidural steroid injection to see if he notes benefit with that. Alternatively we can refer to a pain management clinic where they can discuss pain medication management (since our office does not write for long time use of pain medication). Please ask the patient what he would like and advise we will get that process going for him.   Per Dr Epimenio Foot: "He has not really responded well to medications we have tried.  The lumbar spine showed that he had degenerative changes that were worse at L3-L4.  If he would like we could refer to get an epidural steroid injection done.  We do not write chronic opiate therapy.  So alternatively, if he prefers, we could refer to pain management (such as [facility])"

## 2022-05-09 ENCOUNTER — Ambulatory Visit
Admission: RE | Admit: 2022-05-09 | Discharge: 2022-05-09 | Disposition: A | Discharge status: Home / Self Care | Payer: Medicare Other | Source: Ambulatory Visit | Attending: Neurology | Admitting: Neurology

## 2022-05-09 DIAGNOSIS — G35 Multiple sclerosis: Secondary | ICD-10-CM

## 2022-05-09 DIAGNOSIS — R200 Anesthesia of skin: Secondary | ICD-10-CM

## 2022-05-09 DIAGNOSIS — M4807 Spinal stenosis, lumbosacral region: Secondary | ICD-10-CM

## 2022-05-09 DIAGNOSIS — M79605 Pain in left leg: Secondary | ICD-10-CM

## 2022-05-09 DIAGNOSIS — R269 Unspecified abnormalities of gait and mobility: Secondary | ICD-10-CM

## 2022-05-09 MED ORDER — IOPAMIDOL (ISOVUE-M 200) INJECTION 41%
1.0000 mL | Freq: Once | INTRAMUSCULAR | Status: AC
  Administered 2022-05-09: 1 mL via EPIDURAL

## 2022-05-09 MED ORDER — METHYLPREDNISOLONE ACETATE 40 MG/ML INJ SUSP (RADIOLOG
80.0000 mg | Freq: Once | INTRAMUSCULAR | Status: AC
  Administered 2022-05-09: 80 mg via EPIDURAL

## 2022-05-09 NOTE — Discharge Instructions (Signed)

## 2022-10-09 ENCOUNTER — Ambulatory Visit: Payer: 59 | Admitting: Neurology

## 2022-10-09 ENCOUNTER — Encounter: Payer: Self-pay | Admitting: Neurology

## 2022-10-16 ENCOUNTER — Other Ambulatory Visit: Payer: Self-pay | Admitting: Registered Nurse

## 2022-10-16 ENCOUNTER — Other Ambulatory Visit: Payer: Self-pay | Admitting: Neurology

## 2022-10-16 DIAGNOSIS — R079 Chest pain, unspecified: Secondary | ICD-10-CM

## 2022-10-16 DIAGNOSIS — M4804 Spinal stenosis, thoracic region: Secondary | ICD-10-CM

## 2022-10-16 DIAGNOSIS — G35 Multiple sclerosis: Secondary | ICD-10-CM

## 2022-10-16 MED ORDER — AUBAGIO 14 MG PO TABS: 1.0000 | ORAL_TABLET | Freq: Every day | ORAL | 1 refills | Status: DC

## 2022-10-16 NOTE — Telephone Encounter (Signed)
We have note received call from pharmacy. Can you call pt back and ask to get phone# to pharmacy?

## 2022-10-16 NOTE — Telephone Encounter (Signed)
Pt is requesting a refill for AUBAGIO 14 MG TABS.  Pharmacy:  [facility] [phone]

## 2022-10-16 NOTE — Telephone Encounter (Signed)
Pt called back. Stated the name of his [facility]. Pt states someone should be calling from the pharmacy in a few minutes.

## 2022-10-16 NOTE — Telephone Encounter (Signed)
Patient last seen on 04/02/2022  No follow up scheduled    Called patient and offered 10/21/22 @ 2 pm with Dr.Sater, patient said he will be here. Patient reports only having 2 pills left of Augagio 14 mg tablets left. I asked him to call his insurance and ask them the name of the specialty pharmacy so we can send in Rx for him. Patient said he would, I asked him to call back to let the office know.

## 2022-10-16 NOTE — Telephone Encounter (Addendum)
Called number provided but this was to the nurse line for pt to call into.   I called pt back at [phone]. Explained this to him. Pt disagreed, states this is the number he was told to give [place]. Advised if we call, they will not speak with [place] since we are not the pt. I explained several times to pt that he should call back and ask for phone# to specialty pharmacy and call back to provide this. He did not want to proceed with this. He wanted to wait for [place] to get a phone call from the pharmacy. Kept stating they told him pharmacy was "rx". Informed him this was not enough info to know what pharmacy to send to.  Pt states ok "bye".   I called [phone]. Spoke w/ Mordecai Rasmussen. She would not speak with me since pt not with me. She transferred me to customer service# [phone]. Got transferred to pharmacy services. Spoke w/ Anderson Malta. States he does not have a preferred specialty pharmacy but able to use:  [facility] [place], [place] Phone: [phone]  Fax: [phone]      Pt ID: [id number].   Pt called back and states specialty pharmacy is [facility]. I escribed rx there.

## 2022-10-16 NOTE — Telephone Encounter (Signed)
[phone] 

## 2022-10-17 ENCOUNTER — Other Ambulatory Visit: Payer: Self-pay | Admitting: Registered Nurse

## 2022-10-17 DIAGNOSIS — G35 Multiple sclerosis: Secondary | ICD-10-CM

## 2022-10-17 DIAGNOSIS — M4804 Spinal stenosis, thoracic region: Secondary | ICD-10-CM

## 2022-10-20 NOTE — Patient Instructions (Incomplete)

## 2022-10-20 NOTE — Progress Notes (Deleted)
No chief complaint on file.   HISTORY OF PRESENT ILLNESS:  10/20/22 ALL:  Patrick Gilbert is a 67 y.o. male here today for follow up for RRMS. He continues Aubagio.   He was started on naproxen '500mg'$  BID and baclofen '10mg'$  TID at last visit 03/2022. No improvement in pain. Dr Felecia Shelling advised ESI versus pain management referral 04/2022. CK was 660. Advised repeat labs in 2 weeks but he did not return.   Duloxetine '30mg'$  daily   HISTORY (copied from Dr Garth Bigness previous note)  Patrick Gilbert is a 67 year old man who has had many years of left leg numbness.      Update 04/02/2022 He is on Aubagio and tolerates it well.    He has not had any new MS symptoms since starting the medication.       He continues to report pain in his legs, left > right, mostly in his thighs  Pain is worse after exercise.    He notes tenderness to dep palpation of thighs.  Pain is achy.   His gait is doing the same.     He notes that his gait is mildly wide and off balance.  He stumbles some but has not fallen.  He needs to use the banister going up and down stairs.   He continues to have some pain into the arms, left greater than right.  He has stiffness in the left leg and sometimes the left had.   Left leg is mildly weak.    He does not note weakness in the arms.   Gabapentin has helped the tingling .   Tiizanidine slightly helps the muscle stiffness bur made him sleepy.     Diclofenac 50 mg po bid only mildly helped the pain.       MS history:  Due to gait disturbance, pain and numbness, in 2022, he had MRI of the cervical spine and brain.  The MRI of the spine shows 2 lesions, 1 posterolaterally to the left adjacent to C5-C6 and another centrally adjacent to T2.  There are degenerative changes but no significant spinal stenosis.  Repeat MRI of the brain 11/15/2020 showed multiple lesions predominantly in the hemispheres but also in the right cerebellar hemisphere consistent with MS.  It is likely that some of the foci are  chronic microvascular ischemic change as well.  Compared to the previous MRI from 2019, there are a few new lesions.   Imaging review: MRI brain 11/15/2020 shows T2/FLAIR hyperintense foci in the periventricular, juxtacortical, subcortical and deep white matter both cerebral hemispheres and the right cerebellar hemisphere.  Most of the foci are nonspecific though some foci are radially oriented to the ventricles in the periventricular white matter and in the juxtacortical white matter which could be more consistent with chronic demyelinating plaque.  There is likely superimposed chronic microvascular ischemic change.  None of the foci were acute or enhanced.  However, a couple small foci in the cerebral hemispheres were not readily apparent on the 2019 MRI.     MRI of the cervical spine 10/28/2020 shows a T2 hyperintense focus posteriorly adjacent to C5-C6 noted on the axial images and the sagittal STIR images but not on the sagittal T2-weighted images.  This could represents a focus of demyelination or less likely ischemic change.  Artifact cannot be ruled out as it was not present on all of the T2-weighted images.  Has mild spinal stenosis C4-C5 to C6-C7.   MRI lumbar 10/28/2020  shows At L2-L3, there are degenerative changes causing mild spinal stenosis but no nerve root compression.  At L3-L4, there are degenerative changes causing mild spinal stenosis and moderately severe left lateral recess stenosis and moderate right lateral recess stenosis.  There is potential for left L4 nerve root compression.   REVIEW OF SYSTEMS: Out of a complete 14 system review of symptoms, the patient complains only of the following symptoms, and all other reviewed systems are negative.   ALLERGIES: Allergies  Allergen Reactions   Lipitor [Atorvastatin Calcium] Other (See Comments)    Muscle pain, which he does not have with Rosuvastatin.     HOME MEDICATIONS: Outpatient Medications Prior to Visit  Medication Sig  Dispense Refill   AUBAGIO 14 MG TABS Take 1 tablet (14 mg total) by mouth daily. 90 tablet 1   azelastine (ASTELIN) 0.1 % nasal spray Place 1 spray in each nostril twice a day as needed for runny nose/drainage down throat 30 mL 5   baclofen (LIORESAL) 10 MG tablet 1/2 or 1 pill po tid for spasticity 90 each 5   Carbinoxamine Maleate 4 MG TABS TAKE 1 TABLET BY MOUTH TWICE DAILY AS NEEDED FOR RUNNY NOSE 60 tablet 5   DULoxetine (CYMBALTA) 30 MG capsule TAKE 1 CAPSULE BY MOUTH EVERY MORNING AFTER A MEAL 30 capsule 3   fluticasone (FLONASE) 50 MCG/ACT nasal spray SHAKE LIQUID AND USE 2 SPRAYS IN EACH NOSTRIL EVERY DAY AS NEEDED FOR NASAL CONGESTION 16 g 5   losartan-hydrochlorothiazide (HYZAAR) 50-12.5 MG tablet Take 1 tablet by mouth once daily 60 tablet 0   methylPREDNISolone (MEDROL) 4 MG tablet Taper from 6 pills po for one day to 1 pill po the last day over 6 days 21 tablet 0   montelukast (SINGULAIR) 10 MG tablet Take 1 tablet (10 mg total) by mouth daily. 30 tablet 5   naproxen (EC-NAPROXEN) 500 MG EC tablet Take 1 tablet (500 mg total) by mouth 2 (two) times daily with a meal. 60 tablet 5   rosuvastatin (CRESTOR) 20 MG tablet 1/2 tab by mouth daily with evening meal 30 tablet 6   No facility-administered medications prior to visit.     PAST MEDICAL HISTORY: Past Medical History:  Diagnosis Date   Headache 03/2016   High blood pressure    High cholesterol      PAST SURGICAL HISTORY: Past Surgical History:  Procedure Laterality Date   Repair of left shoulder laceration     cut with broken glass bottle.  Missed a major artery by just a small measurement per patient     FAMILY HISTORY: Family History  Problem Relation Age of Onset   Hypertension Mother    Heart disease Mother        unknown open heart surgery   Hypertension Father    Peripheral vascular disease Father    Heart disease Brother        Unknown heart issue   Seizures Daughter      SOCIAL HISTORY: Social  History   Socioeconomic History   Marital status: Single    Spouse name: Not on file   Number of children: 2   Years of education: 11   Highest education level: 11th grade  Occupational History   Occupation: Nurse, adult job  Tobacco Use   Smoking status: Every Day    Packs/day: 0.50    Years: 48.00    Total pack years: 24.00    Types: Cigarettes   Smokeless tobacco: Former  Media planner  Vaping Use: Never used  Substance and Sexual Activity   Alcohol use: No    Comment: History of drinking too much.  Stopped at age 2 yo   Drug use: Not Currently    Frequency: 1.0 times per week    Types: Marijuana    Comment: Now and then; history of crack cocaine   Sexual activity: Not on file  Other Topics Concern   Not on file  Social History Narrative   Lives alone in an apartment on the 10th floor.  Has 3 children but one is deceased.  Works at an Sales executive.  Education: 11th grade.   Coffee sometimes and MT Dew   Social Determinants of Health   Financial Resource Strain: Not on file  Food Insecurity: No Food Insecurity (07/27/2018)   Hunger Vital Sign    Worried About Running Out of Food in the Last Year: Never true    Ran Out of Food in the Last Year: Never true  Transportation Needs: No Transportation Needs (07/27/2018)   PRAPARE - Hydrologist (Medical): No    Lack of Transportation (Non-Medical): No  Physical Activity: Not on file  Stress: No Stress Concern Present (07/27/2018)   [place]    Feeling of Stress : Not at all  Social Connections: Unknown (07/27/2018)   Social Connection and Isolation Panel [NHANES]    Frequency of Communication with Friends and Family: Not on file    Frequency of Social Gatherings with Friends and Family: Not on file    Attends Religious Services: 1 to 4 times per year    Active Member of [employer] or Organizations: No    Attends Theatre manager Meetings: Never    Marital Status: Not on file  Intimate Partner Violence: Not on file     PHYSICAL EXAM  There were no vitals filed for this visit. There is no height or weight on file to calculate BMI.  Generalized: Well developed, in no acute distress  Cardiology: normal rate and rhythm, no murmur auscultated  Respiratory: clear to auscultation bilaterally    Neurological examination  Mentation: Alert oriented to time, place, history taking. Follows all commands speech and language fluent Cranial nerve II-XII: Pupils were equal round reactive to light. Extraocular movements were full, visual field were full on confrontational test. Facial sensation and strength were normal. Uvula tongue midline. Head turning and shoulder shrug  were normal and symmetric. Motor: The motor testing reveals 5 over 5 strength of all 4 extremities. Good symmetric motor tone is noted throughout.  Sensory: Sensory testing is intact to soft touch on all 4 extremities. No evidence of extinction is noted.  Coordination: Cerebellar testing reveals good finger-nose-finger and heel-to-shin bilaterally.  Gait and station: Gait is normal. Tandem gait is normal. Romberg is negative. No drift is seen.  Reflexes: Deep tendon reflexes are symmetric and normal bilaterally.    DIAGNOSTIC DATA (LABS, IMAGING, TESTING) - I reviewed patient records, labs, notes, testing and imaging myself where available.  Lab Results  Component Value Date   WBC 4.4 04/02/2022   HGB 15.5 04/02/2022   HCT 45.8 04/02/2022   MCV 85 04/02/2022   PLT 154 04/02/2022      Component Value Date/Time   NA 141 12/13/2020 1407   K 4.0 12/13/2020 1407   CL 103 12/13/2020 1407   CO2 19 (L) 12/13/2020 1407   GLUCOSE 92 12/13/2020 1407   GLUCOSE  93 04/22/2019 1252   BUN 11 12/13/2020 1407   CREATININE 0.96 12/13/2020 1407   CALCIUM 9.9 12/13/2020 1407   PROT 6.6 04/02/2022 1552   ALBUMIN 4.5 04/02/2022 1552   AST 20  04/02/2022 1552   ALT 21 04/02/2022 1552   ALKPHOS 51 04/02/2022 1552   BILITOT 0.6 04/02/2022 1552   GFRNONAA >60 04/22/2019 1252   GFRAA >60 04/22/2019 1252   Lab Results  Component Value Date   CHOL 143 10/11/2018   HDL 44 10/11/2018   LDLCALC 77 10/11/2018   TRIG 110 10/11/2018   CHOLHDL 5.3 03/28/2016   Lab Results  Component Value Date   HGBA1C 5.9 (H) 03/27/2016   No results found for: "VITAMINB12" Lab Results  Component Value Date   TSH 0.996 03/27/2016        No data to display               No data to display           ASSESSMENT AND PLAN  67 y.o. year old male  has a past medical history of Headache (03/2016), High blood pressure, and High cholesterol. here with    No diagnosis found.  Mal Misty ***.  Healthy lifestyle habits encouraged. *** will follow up with PCP as directed. *** will return to see me in ***, sooner if needed. *** verbalizes understanding and agreement with this plan.   No orders of the defined types were placed in this encounter.    No orders of the defined types were placed in this encounter.    Debbora Presto, MSN, FNP-C 10/20/2022, 9:47 AM  [facility] [address] [place], [place] [address] [phone]

## 2022-10-21 ENCOUNTER — Ambulatory Visit (INDEPENDENT_AMBULATORY_CARE_PROVIDER_SITE_OTHER): Payer: 59 | Admitting: Neurology

## 2022-10-21 ENCOUNTER — Encounter: Payer: Self-pay | Admitting: Neurology

## 2022-10-21 ENCOUNTER — Ambulatory Visit: Payer: 59 | Admitting: Family Medicine

## 2022-10-21 ENCOUNTER — Telehealth: Payer: Self-pay | Admitting: *Deleted

## 2022-10-21 VITALS — BP 134/82 | HR 86 | Ht 67.0 in | Wt 208.5 lb

## 2022-10-21 DIAGNOSIS — G35D Multiple sclerosis, unspecified: Secondary | ICD-10-CM

## 2022-10-21 DIAGNOSIS — G35 Multiple sclerosis: Secondary | ICD-10-CM

## 2022-10-21 DIAGNOSIS — R200 Anesthesia of skin: Secondary | ICD-10-CM | POA: Diagnosis not present

## 2022-10-21 DIAGNOSIS — M79605 Pain in left leg: Secondary | ICD-10-CM

## 2022-10-21 DIAGNOSIS — M4807 Spinal stenosis, lumbosacral region: Secondary | ICD-10-CM

## 2022-10-21 DIAGNOSIS — Z79899 Other long term (current) drug therapy: Secondary | ICD-10-CM | POA: Diagnosis not present

## 2022-10-21 DIAGNOSIS — M791 Myalgia, unspecified site: Secondary | ICD-10-CM

## 2022-10-21 DIAGNOSIS — R269 Unspecified abnormalities of gait and mobility: Secondary | ICD-10-CM

## 2022-10-21 DIAGNOSIS — E559 Vitamin D deficiency, unspecified: Secondary | ICD-10-CM

## 2022-10-21 MED ORDER — ETODOLAC 400 MG PO TABS: 400.0000 mg | ORAL_TABLET | Freq: Two times a day (BID) | ORAL | 5 refills | Status: DC

## 2022-10-21 MED ORDER — AUBAGIO 14 MG PO TABS: 1.0000 | ORAL_TABLET | Freq: Every day | ORAL | 3 refills | Status: DC

## 2022-10-21 NOTE — Telephone Encounter (Signed)
Mary called from [facility] stating patient would like generic Aubagio 14 mg tablets, reports brand too expensive. I spoke with Alroy Dust (pharmacist) and he changed Rx to generic and only dispensed 90 day supply with 0 refills.

## 2022-10-21 NOTE — Progress Notes (Signed)
[facility]  PATIENT: Patrick Gilbert DOB: 04/15/1956  REFERRING DOCTOR OR PCP:   Raelyn Number PA SOURCE: Patient, notes from primary care  _________________________________   HISTORICAL  CHIEF COMPLAINT:  Chief Complaint  Patient presents with   Room 10    Pt is here Alone. Pt states that he has been out of his medication for 2 months.     HISTORY OF PRESENT ILLNESS:  Patrick Gilbert is a 67 year old man who has had many years of left leg numbness.     Update 10/21/2022: He is on Aubagio and tolerates it well.    He has not had any new MS symptoms since starting the medication.      He is having more pain in the left lower flank.   Initially Celebrex 100 mg helped some but then stopped and 00 mg po bid had not helped much.    He continues to report pain in his legs, left > right, mostly in his thighs  Pain is worse after exercise.    Pain is achy.   His gait is doing the same.    He notes that his gait is mildly wide and off balance.  He stumbles some but has not fallen.  He needs to use the banister going up and down stairs.   He continues to have some pain into the arms, left greater than right.  He has stiffness in the left leg and sometimes the left had.   Left leg is mildly weak.    He does not note weakness in the arms.   Gabapentin has helped the tingling .   Tiizanidine slightly helps the muscle stiffness bur made him sleepy.  Baclofen caused sleepiness      MS history:  Due to gait disturbance, pain and numbness, in 2022, he had MRI of the cervical spine and brain.  The MRI of the spine shows 2 lesions, 1 posterolaterally to the left adjacent to C5-C6 and another centrally adjacent to T2.  There are degenerative changes but no significant spinal stenosis.  Repeat MRI of the brain 11/15/2020 showed multiple lesions predominantly in the hemispheres but also in the right cerebellar hemisphere consistent with MS.  It is likely that some of the foci are chronic  microvascular ischemic change as well.  Compared to the previous MRI from 2019, there are a few new lesions.    Imaging review: MRI brain 11/15/2020 shows T2/FLAIR hyperintense foci in the periventricular, juxtacortical, subcortical and deep white matter both cerebral hemispheres and the right cerebellar hemisphere.  Most of the foci are nonspecific though some foci are radially oriented to the ventricles in the periventricular white matter and in the juxtacortical white matter which could be more consistent with chronic demyelinating plaque.  There is likely superimposed chronic microvascular ischemic change.  None of the foci were acute or enhanced.  However, a couple small foci in the cerebral hemispheres were not readily apparent on the 2019 MRI.    MRI of the cervical spine 10/28/2020 shows a T2 hyperintense focus posteriorly adjacent to C5-C6 noted on the axial images and the sagittal STIR images but not on the sagittal T2-weighted images.  This could represents a focus of demyelination or less likely ischemic change.  Artifact cannot be ruled out as it was not present on all of the T2-weighted images.  Has mild spinal stenosis C4-C5 to C6-C7.  MRI lumbar 10/28/2020 shows At L2-L3, there are degenerative changes causing mild spinal  stenosis but no nerve root compression.  At L3-L4, there are degenerative changes causing mild spinal stenosis and moderately severe left lateral recess stenosis and moderate right lateral recess stenosis.  There is potential for left L4 nerve root compression.  REVIEW OF SYSTEMS: Constitutional: No fevers, chills, sweats, or change in appetite Eyes: No visual changes, double vision, eye pain Ear, nose and throat: No hearing loss, ear pain, nasal congestion, sore throat Cardiovascular: No chest pain, palpitations Respiratory:  No shortness of breath at rest or with exertion.   No wheezes GastrointestinaI: No nausea, vomiting, diarrhea, abdominal pain, fecal  incontinence Genitourinary:  No dysuria, urinary retention or frequency.  No nocturia. Musculoskeletal: As above  integumentary: No rash, pruritus, skin lesions Neurological: as above Psychiatric: No depression at this time.  No anxiety Endocrine: No palpitations, diaphoresis, change in appetite, change in weigh or increased thirst Hematologic/Lymphatic:  No anemia, purpura, petechiae. Allergic/Immunologic: No itchy/runny eyes, nasal congestion, recent allergic reactions, rashes  ALLERGIES: Allergies  Allergen Reactions   Lipitor [Atorvastatin Calcium] Other (See Comments)    Muscle pain, which he does not have with Rosuvastatin.    HOME MEDICATIONS:  Current Outpatient Medications:    Carbinoxamine Maleate 4 MG TABS, TAKE 1 TABLET BY MOUTH TWICE DAILY AS NEEDED FOR RUNNY NOSE, Disp: 60 tablet, Rfl: 5   etodolac (LODINE) 400 MG tablet, Take 1 tablet (400 mg total) by mouth 2 (two) times daily., Disp: 60 tablet, Rfl: 5   losartan-hydrochlorothiazide (HYZAAR) 50-12.5 MG tablet, Take 1 tablet by mouth once daily, Disp: 60 tablet, Rfl: 0   AUBAGIO 14 MG TABS, Take 1 tablet (14 mg total) by mouth daily., Disp: 90 tablet, Rfl: 3   fluticasone (FLONASE) 50 MCG/ACT nasal spray, SHAKE LIQUID AND USE 2 SPRAYS IN EACH NOSTRIL EVERY DAY AS NEEDED FOR NASAL CONGESTION (Patient not taking: Reported on 10/21/2022), Disp: 16 g, Rfl: 5   montelukast (SINGULAIR) 10 MG tablet, Take 1 tablet (10 mg total) by mouth daily. (Patient not taking: Reported on 10/21/2022), Disp: 30 tablet, Rfl: 5   rosuvastatin (CRESTOR) 20 MG tablet, 1/2 tab by mouth daily with evening meal (Patient not taking: Reported on 10/21/2022), Disp: 30 tablet, Rfl: 6  PAST MEDICAL HISTORY: Past Medical History:  Diagnosis Date   Headache 03/2016   High blood pressure    High cholesterol     PAST SURGICAL HISTORY: Past Surgical History:  Procedure Laterality Date   Repair of left shoulder laceration     cut with broken glass bottle.   Missed a major artery by just a small measurement per patient    FAMILY HISTORY: Family History  Problem Relation Age of Onset   Hypertension Mother    Heart disease Mother        unknown open heart surgery   Hypertension Father    Peripheral vascular disease Father    Heart disease Brother        Unknown heart issue   Seizures Daughter     SOCIAL HISTORY:  Social History   Socioeconomic History   Marital status: Single    Spouse name: Not on file   Number of children: 2   Years of education: 11   Highest education level: 11th grade  Occupational History   Occupation: Nurse, adult job  Tobacco Use   Smoking status: Every Day    Packs/day: 0.50    Years: 48.00    Total pack years: 24.00    Types: Cigarettes   Smokeless tobacco: Former  Electronics engineer  Use   Vaping Use: Never used  Substance and Sexual Activity   Alcohol use: No    Comment: History of drinking too much.  Stopped at age 31 yo   Drug use: Not Currently    Frequency: 1.0 times per week    Types: Marijuana    Comment: Now and then; history of crack cocaine   Sexual activity: Not on file  Other Topics Concern   Not on file  Social History Narrative   Lives alone in an apartment on the 10th floor.  Has 3 children but one is deceased.  Works at an Engineer, maintenance.  Education: 11th grade.   Coffee sometimes and MT Dew   Social Determinants of Health   Financial Resource Strain: Not on file  Food Insecurity: No Food Insecurity (07/27/2018)   Hunger Vital Sign    Worried About Running Out of Food in the Last Year: Never true    Ran Out of Food in the Last Year: Never true  Transportation Needs: No Transportation Needs (07/27/2018)   PRAPARE - Administrator, Civil Service (Medical): No    Lack of Transportation (Non-Medical): No  Physical Activity: Not on file  Stress: No Stress Concern Present (07/27/2018)   Patrick Gilbert [employer] Occupational [employer] - Occupational Stress Questionnaire    Feeling  of Stress : Not at all  Social Connections: Unknown (07/27/2018)   Social Connection and Isolation Panel [NHANES]    Frequency of Communication with Friends and Family: Not on file    Frequency of Social Gatherings with Friends and Family: Not on file    Attends Religious Services: 1 to 4 times per year    Active Member of Clubs or Organizations: No    Attends Banker Meetings: Never    Marital Status: Not on file  Intimate Partner Violence: Not on file     PHYSICAL EXAM  Vitals:   10/21/22 1404  BP: 134/82  Pulse: 86  Weight: 208 lb 8 oz (94.6 kg)  Height: 5\' 7"  (1.702 m)     Body mass index is 32.66 kg/m.   General: The patient is well-developed and well-nourished and in no acute distress muscles are tender in the thighs to deep palpation but not in the calves or shoulders  HEENT:  Head is [place]/AT.  Sclera are anicteric.     Neck:  .  The neck is nontender.   Skin: Extremities are without rash or  edema.  Neurologic Exam  Mental status: The patient is alert and oriented x 3 at the time of the examination. The patient has apparent normal recent and remote memory, with an apparently normal attention span and concentration ability.   Speech is normal.  Cranial nerves: Extraocular movements are full.  Facial symmetry is present.  Facial strength was normal.  No dysarthria is noted.    No obvious hearing deficits are noted.  Motor:  Muscle bulk is normal.   Tone is mildly increased in the left leg. Strength is  4+/5 in iliopsoas and 5 / 5 elsewhere   Sensory: Sensory testing is intact to pinprick, soft touch and vibration sensation in the right arm and leg.  He reported mildly reduced sensation to touch in the C7 distribution of the left hand.  Some reduced sensation near the ankle on the left to touch, symmetric sensation to vibration.  Coordination: Cerebellar testing reveals good finger-nose-finger and heel-to-shin bilaterally.  Gait and station: Station is  normal.  Gait is  mildly spastic, left greater than right.  Tandem gait is wide. .. Romberg is negative.   Reflexes: Deep tendon reflexes are symmetric and normal bilaterally.        DIAGNOSTIC DATA (LABS, IMAGING, TESTING) - I reviewed patient records, labs, notes, testing and imaging myself where available.  Lab Results  Component Value Date   WBC 4.4 04/02/2022   HGB 15.5 04/02/2022   HCT 45.8 04/02/2022   MCV 85 04/02/2022   PLT 154 04/02/2022      Component Value Date/Time   NA 141 12/13/2020 1407   K 4.0 12/13/2020 1407   CL 103 12/13/2020 1407   CO2 19 (L) 12/13/2020 1407   GLUCOSE 92 12/13/2020 1407   GLUCOSE 93 04/22/2019 1252   BUN 11 12/13/2020 1407   CREATININE 0.96 12/13/2020 1407   CALCIUM 9.9 12/13/2020 1407   PROT 6.6 04/02/2022 1552   ALBUMIN 4.5 04/02/2022 1552   AST 20 04/02/2022 1552   ALT 21 04/02/2022 1552   ALKPHOS 51 04/02/2022 1552   BILITOT 0.6 04/02/2022 1552   GFRNONAA >60 04/22/2019 1252   GFRAA >60 04/22/2019 1252   Lab Results  Component Value Date   CHOL 143 10/11/2018   HDL 44 10/11/2018   LDLCALC 77 10/11/2018   TRIG 110 10/11/2018   CHOLHDL 5.3 03/28/2016   Lab Results  Component Value Date   HGBA1C 5.9 (H) 03/27/2016   No results found for: "VITAMINB12" Lab Results  Component Value Date   TSH 0.996 03/27/2016       ASSESSMENT AND PLAN  Multiple sclerosis ([place]) - Plan: AUBAGIO 14 MG TABS, CK, TSH, Hepatic function panel, CBC with Differential/Platelet  Numbness  High risk medication use - Plan: CK, TSH, Hepatic function panel, CBC with Differential/Platelet  Gait disturbance  Myalgia - Plan: CK, TSH  Vitamin D deficiency - Plan: VITAMIN D 25 Hydroxy (Vit-D Deficiency, Fractures)   1.  Continue teriflunomide (we sent in a script)  Check MRI brain to determine if subclinical progression and  change to a  stronger DMT if present.   LFT, CBC with differential and creatinine kinase 2.   Etodolac 400 mg twice daily     3.   Stay active and exercise as tolerated. 4.   Return in 5-6 months or sooner if new or worsening neurologic symptoms.      This visit is part of a comprehensive longitudinal care medical relationship regarding the patients primary diagnosis of Patrick Gilbert and related concerns.   Patrick Gilbert A. Felecia Shelling, MD, [facility] 11/15/2023, 4:27 PM Certified in Neurology, Clinical Neurophysiology, Sleep Medicine and Neuroimaging  [facility] [address] [place], [place] [address] [phone]

## 2022-10-22 ENCOUNTER — Telehealth: Payer: Self-pay

## 2022-10-22 LAB — CBC WITH DIFFERENTIAL/PLATELET
Basophils Absolute: 0 10*3/uL (ref 0.0–0.2)
Basos: 1 %
EOS (ABSOLUTE): 0.2 10*3/uL (ref 0.0–0.4)
Eos: 2 %
Hematocrit: 46 % (ref 37.5–51.0)
Hemoglobin: 15.5 g/dL (ref 13.0–17.7)
Immature Grans (Abs): 0.1 10*3/uL (ref 0.0–0.1)
Immature Granulocytes: 1 %
Lymphocytes Absolute: 3 10*3/uL (ref 0.7–3.1)
Lymphs: 34 %
MCH: 28.4 pg (ref 26.6–33.0)
MCHC: 33.7 g/dL (ref 31.5–35.7)
MCV: 84 fL (ref 79–97)
Monocytes Absolute: 0.9 10*3/uL (ref 0.1–0.9)
Monocytes: 10 %
Neutrophils Absolute: 4.6 10*3/uL (ref 1.4–7.0)
Neutrophils: 52 %
Platelets: 196 10*3/uL (ref 150–450)
RBC: 5.46 x10E6/uL (ref 4.14–5.80)
RDW: 13.4 % (ref 11.6–15.4)
WBC: 8.8 10*3/uL (ref 3.4–10.8)

## 2022-10-22 LAB — CK: Total CK: 215 U/L (ref 41–331)

## 2022-10-22 LAB — HEPATIC FUNCTION PANEL
ALT: 23 IU/L (ref 0–44)
AST: 20 IU/L (ref 0–40)
Albumin: 4.5 g/dL (ref 3.9–4.9)
Alkaline Phosphatase: 57 IU/L (ref 44–121)
Bilirubin Total: 0.2 mg/dL (ref 0.0–1.2)
Bilirubin, Direct: 0.11 mg/dL (ref 0.00–0.40)
Total Protein: 6.8 g/dL (ref 6.0–8.5)

## 2022-10-22 LAB — VITAMIN D 25 HYDROXY (VIT D DEFICIENCY, FRACTURES): Vit D, 25-Hydroxy: 17 ng/mL — ABNORMAL LOW (ref 30.0–100.0)

## 2022-10-22 LAB — TSH: TSH: 2.18 u[IU]/mL (ref 0.450–4.500)

## 2022-10-22 NOTE — Telephone Encounter (Signed)
Called Pt and told him about his lab results, Pt verbalized understanding and Thanked me for calling him.

## 2022-11-03 ENCOUNTER — Telehealth: Payer: Self-pay | Admitting: *Deleted

## 2022-11-03 ENCOUNTER — Telehealth: Payer: Self-pay | Admitting: Neurology

## 2022-11-03 ENCOUNTER — Encounter: Payer: Self-pay | Admitting: Neurology

## 2022-11-03 NOTE — Telephone Encounter (Signed)
I called patient to discuss the below. Patient reports his legs are still hurting, states you told him he has a pinched nerve. 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  He states his job told him he could go to 4 days per week if her received a doctor's note. (Pt said he wants Friday off, he off on weekends) Patient does have is MRI's scheduled on 11/06/22.   Please advise

## 2022-11-03 NOTE — Telephone Encounter (Signed)
Patrick Gilbert, can you provide pt copy of note Dr. Felecia Shelling wrote? Thank you

## 2022-11-03 NOTE — Telephone Encounter (Signed)
I called pt not able to reach pt. Pt letter is ready for p/u @ the front desk.

## 2022-11-03 NOTE — Telephone Encounter (Signed)
Pt is calling requesting a letter from Dr. Felecia Shelling. Stating that he can only work 4 days a week. Stated his legs are still bothering him.

## 2022-11-05 ENCOUNTER — Telehealth: Payer: Self-pay | Admitting: Neurology

## 2022-11-05 ENCOUNTER — Encounter (HOSPITAL_COMMUNITY): Payer: Self-pay

## 2022-11-05 ENCOUNTER — Other Ambulatory Visit: Payer: Self-pay

## 2022-11-05 ENCOUNTER — Emergency Department (HOSPITAL_COMMUNITY)
Admission: EM | Admit: 2022-11-05 | Discharge: 2022-11-05 | Disposition: A | Discharge status: Home / Self Care | Payer: 59 | Attending: Emergency Medicine | Admitting: Emergency Medicine

## 2022-11-05 DIAGNOSIS — G8929 Other chronic pain: Secondary | ICD-10-CM | POA: Diagnosis not present

## 2022-11-05 DIAGNOSIS — Z79899 Other long term (current) drug therapy: Secondary | ICD-10-CM | POA: Diagnosis not present

## 2022-11-05 DIAGNOSIS — M545 Low back pain, unspecified: Secondary | ICD-10-CM | POA: Insufficient documentation

## 2022-11-05 DIAGNOSIS — M62830 Muscle spasm of back: Secondary | ICD-10-CM | POA: Insufficient documentation

## 2022-11-05 DIAGNOSIS — I10 Essential (primary) hypertension: Secondary | ICD-10-CM | POA: Insufficient documentation

## 2022-11-05 MED ORDER — KETOROLAC TROMETHAMINE 15 MG/ML IJ SOLN: 15.0000 mg | Freq: Once | INTRAMUSCULAR | Status: DC

## 2022-11-05 MED ORDER — METHOCARBAMOL 500 MG PO TABS
500.0000 mg | ORAL_TABLET | Freq: Once | ORAL | Status: DC
Start: 2022-11-05 — End: 2022-11-05

## 2022-11-05 MED ORDER — KETOROLAC TROMETHAMINE 15 MG/ML IJ SOLN
15.0000 mg | Freq: Once | INTRAMUSCULAR | Status: AC
  Administered 2022-11-05: 15 mg via INTRAMUSCULAR
  Filled 2022-11-05: qty 1

## 2022-11-05 MED ORDER — METHOCARBAMOL 1000 MG/10ML IJ SOLN: 1000.0000 mg | Freq: Once | INTRAMUSCULAR | Status: DC

## 2022-11-05 MED ORDER — METHOCARBAMOL 500 MG PO TABS: 500.0000 mg | ORAL_TABLET | Freq: Three times a day (TID) | ORAL | 0 refills | Status: DC | PRN

## 2022-11-05 MED ORDER — HYDROCODONE-ACETAMINOPHEN 5-325 MG PO TABS
1.0000 | ORAL_TABLET | Freq: Once | ORAL | Status: AC
  Administered 2022-11-05: 1 via ORAL
  Filled 2022-11-05: qty 1

## 2022-11-05 NOTE — ED Notes (Signed)
Writer and nurse tech encouraging pt to urinate, however pt has stated multiple times he already provided a sample to his PCP at his doctor's apt last week. Writer explained [facility] still needs a sample.

## 2022-11-05 NOTE — Discharge Instructions (Signed)
Seen today for back pain.  Since your pain is improved we are sending you home with some muscle relaxers.  He can continue taking the medication your neurologist prescribed, follow-up with the MRI tomorrow.  Come back for any new or worsening symptoms.

## 2022-11-05 NOTE — ED Provider Notes (Signed)
[facility] Provider Note   CSN: [id number] Arrival date & time: 11/05/22  0601     History  Chief Complaint  Patient presents with   Back Pain    Pt in with c/o worsening back pain over the past 24 hrs, worse with movement. Pt states he has an MRI scheduled for tomorrow. Chronic pain takes Lodine and other meds to manage at home. 10/10 pain    Patrick Gilbert is a 67 y.o. male.  History of multiple sclerosis, hypertension and chronic low back pain.  He presents today complaining of increased left low back pain.  He states he has had this pain on and off for the past several years after injuring himself jumping from 1 chair to another.  He states he works on his [employer] work daily and is on hard floors.  He denies any new injury or trauma, denies fevers or chills, no saddle anesthesia or paresthesia.  He states this morning when he woke up his left lower bowel back felt stiff and has pain with any movement and with ambulation.  He has an MRI scheduled for tomorrow scheduled by his neurologist   Back Pain Associated symptoms: no fever, no numbness and no weakness        Home Medications Prior to Admission medications   Medication Sig Start Date End Date Taking? Authorizing Provider  methocarbamol (ROBAXIN) 500 MG tablet Take 1 tablet (500 mg total) by mouth every 8 (eight) hours as needed for muscle spasms. 11/05/22  Yes Torian Quintero A, PA-C  AUBAGIO 14 MG TABS Take 1 tablet (14 mg total) by mouth daily. 10/21/22   Sater, Nanine Means, MD  Carbinoxamine Maleate 4 MG TABS TAKE 1 TABLET BY MOUTH TWICE DAILY AS NEEDED FOR RUNNY NOSE 05/05/22   Althea Charon, FNP  etodolac (LODINE) 400 MG tablet Take 1 tablet (400 mg total) by mouth 2 (two) times daily. 10/21/22   Sater, Nanine Means, MD  fluticasone (FLONASE) 50 MCG/ACT nasal spray SHAKE LIQUID AND USE 2 SPRAYS IN EACH NOSTRIL EVERY DAY AS NEEDED FOR NASAL CONGESTION Patient not taking:  Reported on 10/21/2022 09/02/21   Althea Charon, FNP  losartan-hydrochlorothiazide [facility]) 50-12.5 MG tablet Take 1 tablet by mouth once daily 09/05/19   Mack Hook, MD  montelukast (SINGULAIR) 10 MG tablet Take 1 tablet (10 mg total) by mouth daily. Patient not taking: Reported on 10/21/2022 07/01/21   Althea Charon, FNP  rosuvastatin (CRESTOR) 20 MG tablet 1/2 tab by mouth daily with evening meal Patient not taking: Reported on 10/21/2022 07/27/18   Mack Hook, MD      Allergies    Lipitor [atorvastatin calcium]    Review of Systems   Review of Systems  Constitutional:  Negative for appetite change, chills, fever and unexpected weight change.  Musculoskeletal:  Positive for back pain.  Neurological:  Negative for weakness and numbness.    Physical Exam Updated Vital Signs BP 124/80 (BP Location: Right Arm)   Pulse 72   Temp 98.3 F (36.8 C) (Oral)   Resp 18   SpO2 100%  Physical Exam Vitals and nursing note reviewed.  Constitutional:      General: He is awake. He is not in acute distress.    Appearance: He is well-developed and well-groomed.  HENT:     Head: Normocephalic and atraumatic.  Eyes:     Conjunctiva/sclera: Conjunctivae normal.  Cardiovascular:     Rate and Rhythm: Normal rate  and regular rhythm.     Heart sounds: No murmur heard. Pulmonary:     Effort: Pulmonary effort is normal. No respiratory distress.     Breath sounds: Normal breath sounds.  Abdominal:     Palpations: Abdomen is soft.     Tenderness: There is no abdominal tenderness.  Musculoskeletal:        General: No swelling.     Cervical back: Neck supple.     Lumbar back: Spasms and tenderness present. No swelling.     Comments: Spasm left lumbar area with tenderness  Skin:    General: Skin is warm and dry.     Capillary Refill: Capillary refill takes less than 2 seconds.  Neurological:     Mental Status: He is alert.  Psychiatric:        Mood and Affect: Mood normal.      ED Results / Procedures / Treatments   Labs (all labs ordered are listed, but only abnormal results are displayed) Labs Reviewed  URINALYSIS, ROUTINE W REFLEX MICROSCOPIC    EKG None  Radiology No results found.  Procedures Procedures    Medications Ordered in ED Medications  methocarbamol (ROBAXIN) tablet 500 mg (500 mg Oral Not Given 11/05/22 0749)  HYDROcodone-acetaminophen (NORCO/VICODIN) 5-325 MG per tablet 1 tablet (1 tablet Oral Given 11/05/22 0742)  ketorolac (TORADOL) 15 MG/ML injection 15 mg (15 mg Intramuscular Given 11/05/22 [id number])    ED Course/ Medical Decision Making/ A&P                             Medical Decision Making This patient presents to the ED for concern of back pain this involves an extensive number of treatment options, and is a complaint that carries with it a high risk of complications and morbidity.  The differential diagnosis includes sprain, strain, HNP, fracture, DDD, muscle spasm, cauda equina, epidural abscess or hematoma, malignancy, other   Co morbidities that complicate the patient evaluation  MS   Additional history obtained:  Additional history obtained from MR External records from outside source obtained and reviewed including outpatient neurology notes   Lab Tests:  I Ordered urinalysis after postvoid residual the patient did not want to stay for this, states he had recent urinalysis with his PCP and has no urinary symptoms.  Problem List / ED Course / Critical interventions / Medication management  Patient presents with low back pain without trauma, no signs or symptoms of cord compression, no infectious signs or symptoms.  MRI scheduled for tomorrow no need for urgent or emergent MRI at this time.  He got good pain relief with Robaxin, Toradol and Norco.  He is able to ambulate.  He is already on Lodine at home.  Prescribed Robaxin.Marland Kitchen Differential considered as above. Symptoms are likely due to also spasm. They have no  high risk features including saddle anesthesia/paresthesia, bowel or bladder incontinence, urinary retention, fever, weight loss, history of cancer or immune suppression, or IV drug use. Reevaluation of the patient after these medicines showed that the patient improved I have reviewed the patients home medicines and have made adjustments as needed      Amount and/or Complexity of Data Reviewed Labs: ordered.  Risk Prescription drug management.           Final Clinical Impression(s) / ED Diagnoses Final diagnoses:  Chronic left-sided low back pain without sciatica    Rx / DC Orders ED Discharge Orders  Ordered    methocarbamol (ROBAXIN) 500 MG tablet  Every 8 hours PRN        11/05/22 1045              Darci Current 11/05/22 1548    Elgie Congo, MD 11/05/22 1750

## 2022-11-05 NOTE — ED Notes (Signed)
Repositioned pt in bed for comfort Provided pt with a [place] sandwich and ginger ale Pt stated he is unable to pee due to low intake of fluids...waiting on pt to drink ginger ale and water to urinate

## 2022-11-05 NOTE — Telephone Encounter (Signed)
Called pt back. He is at [facility] there around 5am this morning. He would hardly walk/had back pain. He is currently in ER, having further tests run currently. I recommended he call [facility] if he feels he cannot make MRI appt tomorrow to reschedule if needed. He verbalized understanding. He just wanted to let Dr. Felecia Shelling know what was going on.

## 2022-11-05 NOTE — Telephone Encounter (Signed)
Pt asked it be noted he is in the hospital due to back trouble, pt unsure if he can do his MRI tomorrow.  Pt would like a call to discuss.

## 2022-11-06 ENCOUNTER — Ambulatory Visit
Admission: RE | Admit: 2022-11-06 | Discharge: 2022-11-06 | Disposition: A | Discharge status: Home / Self Care | Payer: Medicare Other | Source: Ambulatory Visit | Attending: Neurology | Admitting: Neurology

## 2022-11-06 ENCOUNTER — Ambulatory Visit
Admission: RE | Admit: 2022-11-06 | Discharge: 2022-11-06 | Disposition: A | Discharge status: Home / Self Care | Payer: Medicare Other | Source: Ambulatory Visit | Attending: Registered Nurse | Admitting: Registered Nurse

## 2022-11-06 DIAGNOSIS — G35 Multiple sclerosis: Secondary | ICD-10-CM

## 2022-11-06 DIAGNOSIS — R269 Unspecified abnormalities of gait and mobility: Secondary | ICD-10-CM

## 2022-11-12 ENCOUNTER — Ambulatory Visit
Admission: RE | Admit: 2022-11-12 | Discharge: 2022-11-12 | Disposition: A | Discharge status: Home / Self Care | Payer: 59 | Source: Ambulatory Visit | Attending: Registered Nurse | Admitting: Registered Nurse

## 2022-11-12 ENCOUNTER — Ambulatory Visit
Admission: RE | Admit: 2022-11-12 | Discharge: 2022-11-12 | Disposition: A | Discharge status: Home / Self Care | Payer: 59 | Source: Ambulatory Visit | Attending: Neurology | Admitting: Neurology

## 2022-11-12 DIAGNOSIS — R269 Unspecified abnormalities of gait and mobility: Secondary | ICD-10-CM

## 2022-11-12 DIAGNOSIS — G35 Multiple sclerosis: Secondary | ICD-10-CM | POA: Diagnosis not present

## 2022-11-12 MED ORDER — GADOPICLENOL 0.5 MMOL/ML IV SOLN
10.0000 mL | Freq: Once | INTRAVENOUS | Status: AC | PRN
Start: 2022-11-12 — End: 2022-11-12
  Administered 2022-11-12: 10 mL via INTRAVENOUS

## 2022-11-13 ENCOUNTER — Telehealth: Payer: Self-pay | Admitting: *Deleted

## 2022-11-13 NOTE — Telephone Encounter (Signed)
I called and spoke with patient and he states Dr.Sater wanted him to have MRI of spine as well. I explained to patient Dr. Arthur Holms order MRI spine which was done on 11/12/22 ( in epic). Pt states that was an old order, however he did call her office to get results and states he was told to call you for results. Pt said he would prefer you to review his results anyway. Pt is aware you are out of the office and will return next week, he was fine with waiting.   Please advise

## 2022-11-13 NOTE — Telephone Encounter (Signed)
-----   Message from Britt Bottom, MD sent at 11/12/2022  9:35 PM EST ----- Please let him know that the MRI was unchanged.

## 2022-11-13 NOTE — Telephone Encounter (Signed)
Pt left a vm @ 12:39 asking that he be called with the results of the MRI on his back as well.  Pt states it was Dr Felecia Shelling who suggested the brain and back MRI

## 2022-11-13 NOTE — Telephone Encounter (Signed)
Called and spoke with pt about MRI brain results per Dr. Felecia Shelling note. Pt verbalized understanding.

## 2022-11-17 ENCOUNTER — Telehealth: Payer: Self-pay

## 2022-11-17 ENCOUNTER — Other Ambulatory Visit: Payer: Self-pay | Admitting: Neurology

## 2022-11-17 MED ORDER — DULOXETINE HCL 60 MG PO CPEP: 60.0000 mg | ORAL_CAPSULE | Freq: Every day | ORAL | 5 refills | Status: DC

## 2022-11-17 NOTE — Telephone Encounter (Signed)
Called pt. Relayed results per Dr. Garth Bigness note. Pt verbalized understanding. He is having ongoing nerve/back pain and wondering if there is a medication Dr. Felecia Shelling can call in for this. Aware I will send to Dr. Felecia Shelling for review and will call him back.  He will drop off disability form for MD to review. If MD feels he does not qualify, he would just put this on form. He is out of work right now.

## 2022-11-17 NOTE — Telephone Encounter (Signed)
Called pt. Relayed Dr. Felecia Shelling called in duloxetine '60mg'$  po qd for him to try. He verbalized understanding and appreciation.

## 2022-11-17 NOTE — Telephone Encounter (Signed)
See other phone note

## 2022-11-17 NOTE — Telephone Encounter (Signed)
Pt is asking for a call back re: MRI results

## 2022-11-17 NOTE — Telephone Encounter (Signed)
Tried to call pt about his MRI Results, pt didn't answer,was unable to leave a message.

## 2022-11-20 ENCOUNTER — Telehealth: Payer: Self-pay | Admitting: Neurology

## 2022-11-20 NOTE — Telephone Encounter (Signed)
Pt said dropped off a disability form 11/19/22. Would like to be contacted when form has been completed and ready for pick up.

## 2022-11-20 NOTE — Telephone Encounter (Signed)
Noted, it is currently being reviewed by MD.

## 2022-11-20 NOTE — Telephone Encounter (Signed)
Called pt. He put himself out of work starting 11/12/22 d/t back pain. Going back on 12/07/22. However, work needs Dr. Felecia Shelling to fill out return to work form for this date.  He felt ESI helped some but declined going for repeat ESI at this time. He is taking duloxetine recently prescribed by Dr. Felecia Shelling and states this is helping. Aware I will give info back to Dr. Felecia Shelling to do final review and have medical records call him next week to let him know once its ready for pick up. He verbalized understanding.

## 2022-11-24 NOTE — Telephone Encounter (Signed)
Please call pt back. We are just waiting on Dr. Felecia Shelling to do final review of form. We will call him once ready for pick up.

## 2022-11-24 NOTE — Telephone Encounter (Signed)
Patient informed form is ready for pick up at check in.

## 2022-11-24 NOTE — Telephone Encounter (Signed)
Pt called wanting to know when he can pick up the form that is being filled out for him. Pt states he would like to know if he can pick it up today.

## 2022-11-24 NOTE — Telephone Encounter (Signed)
Called and informed pt.  

## 2023-01-02 ENCOUNTER — Other Ambulatory Visit: Payer: Self-pay | Admitting: Neurology

## 2023-01-02 DIAGNOSIS — G35 Multiple sclerosis: Secondary | ICD-10-CM

## 2023-01-07 ENCOUNTER — Telehealth: Payer: Self-pay | Admitting: Neurology

## 2023-01-07 NOTE — Telephone Encounter (Signed)
Mailed letter per pt request from Dr Epimenio Foot written in February

## 2023-02-03 ENCOUNTER — Other Ambulatory Visit: Payer: Self-pay | Admitting: Registered Nurse

## 2023-02-03 DIAGNOSIS — F17210 Nicotine dependence, cigarettes, uncomplicated: Secondary | ICD-10-CM

## 2023-03-10 ENCOUNTER — Ambulatory Visit
Admission: RE | Admit: 2023-03-10 | Discharge: 2023-03-10 | Disposition: A | Discharge status: Home / Self Care | Payer: 59 | Source: Ambulatory Visit | Attending: Registered Nurse | Admitting: Registered Nurse

## 2023-03-10 DIAGNOSIS — F17210 Nicotine dependence, cigarettes, uncomplicated: Secondary | ICD-10-CM

## 2023-03-17 ENCOUNTER — Other Ambulatory Visit: Payer: Self-pay

## 2023-03-17 ENCOUNTER — Telehealth: Payer: Self-pay

## 2023-03-17 NOTE — Telephone Encounter (Signed)
Called pt and he stated he is still getting his Teriflunomide 14mg  Tab from [facility].

## 2023-04-13 ENCOUNTER — Ambulatory Visit (INDEPENDENT_AMBULATORY_CARE_PROVIDER_SITE_OTHER): Payer: 59 | Admitting: Neurology

## 2023-04-13 ENCOUNTER — Encounter: Payer: Self-pay | Admitting: Neurology

## 2023-04-13 VITALS — BP 159/80 | HR 90 | Ht 66.0 in | Wt 211.0 lb

## 2023-04-13 DIAGNOSIS — E559 Vitamin D deficiency, unspecified: Secondary | ICD-10-CM | POA: Diagnosis not present

## 2023-04-13 DIAGNOSIS — M4807 Spinal stenosis, lumbosacral region: Secondary | ICD-10-CM

## 2023-04-13 DIAGNOSIS — R269 Unspecified abnormalities of gait and mobility: Secondary | ICD-10-CM | POA: Diagnosis not present

## 2023-04-13 DIAGNOSIS — Z79899 Other long term (current) drug therapy: Secondary | ICD-10-CM

## 2023-04-13 DIAGNOSIS — G35 Multiple sclerosis: Secondary | ICD-10-CM | POA: Diagnosis not present

## 2023-04-13 MED ORDER — CARBINOXAMINE MALEATE 4 MG PO TABS
ORAL_TABLET | ORAL | 1 refills | Status: DC
Start: 2023-04-13 — End: 2023-06-11

## 2023-04-13 MED ORDER — VITAMIN D (ERGOCALCIFEROL) 1.25 MG (50000 UNIT) PO CAPS: 50000.0000 [IU] | ORAL_CAPSULE | ORAL | 1 refills | Status: DC

## 2023-04-13 NOTE — Progress Notes (Signed)
[facility]  PATIENT: Patrick Gilbert DOB: May 27, 1956  REFERRING DOCTOR OR PCP:   Norva Riffle PA SOURCE: Patient, notes from primary care  _________________________________   HISTORICAL  CHIEF COMPLAINT:  Chief Complaint  Patient presents with   Room 11    Pt is here Alone, Pt states that things have been going good with his MS since is last appointment. Pt states that he doesn't have any new symptoms or complaints to discuss today with Dr. Epimenio Foot.     HISTORY OF PRESENT ILLNESS:  Patrick Gilbert is a 67 year old man who has had many years of left leg numbness.     Update 04/13/2023: He is on Aubagio (teriflunomide) and tolerates it well.    He has not had any new MS symptoms since starting the medication.      He is having more pain in the left lower flank.   Initially Celebrex 100 mg helped some but then stopped and 00 mg po bid had not helped much.    He continues to report pain in his legs, left > right, mostly in his thighs  Pain is worse after exercise.    Pain is achy.   His gait is doing the same.    He notes that his gait is mildly wide and off balance.  He stumbles some but has not fallen.  He needs to use the banister going up and down stairs.   He continues to have some pain into the arms, left greater than right.  He has stiffness in the left leg and sometimes the left had.   Left leg is mildly weak.    He does not note weakness in the arms.   Gabapentin has helped the tingling .   Tiizanidine slightly helps the muscle stiffness bur made him sleepy.  Baclofen caused sleepiness     He has had more LBP and has known DJD.  Legs no longer hurt and he is walking better  Vit D was low.       MS history:  Due to gait disturbance, pain and numbness, in 2022, he had MRI of the cervical spine and brain.  The MRI of the spine shows 2 lesions, 1 posterolaterally to the left adjacent to C5-C6 and another centrally adjacent to T2.  There are degenerative changes  but no significant spinal stenosis.  Repeat MRI of the brain 11/15/2020 showed multiple lesions predominantly in the hemispheres but also in the right cerebellar hemisphere consistent with MS.  It is likely that some of the foci are chronic microvascular ischemic change as well.  Compared to the previous MRI from 2019, there are a few new lesions.He was started on teriflunomide in 2022.  .      Imaging review: MRI brain 11/12/2022 showed Multiple T2/FLAIR hyperintense foci in the cerebral hemispheres and in the right cerebellar hemisphere in a pattern and configuration consistent with chronic demyelinating plaque associated with multiple sclerosis. None of the foci enhanced or appear to be acute. Compared to the MRI from 11/15/2020, there were no new lesions.    MRI brain 11/15/2020 shows T2/FLAIR hyperintense foci in the periventricular, juxtacortical, subcortical and deep white matter both cerebral hemispheres and the right cerebellar hemisphere.  Most of the foci are nonspecific though some foci are radially oriented to the ventricles in the periventricular white matter and in the juxtacortical white matter which could be more consistent with chronic demyelinating plaque.  There is likely superimposed chronic microvascular ischemic  change.  None of the foci were acute or enhanced.  However, a couple small foci in the cerebral hemispheres were not readily apparent on the 2019 MRI.    MRI of the cervical spine 10/28/2020 shows a T2 hyperintense focus posteriorly adjacent to C5-C6 noted on the axial images and the sagittal STIR images but not on the sagittal T2-weighted images.  This could represents a focus of demyelination or less likely ischemic change.  Artifact cannot be ruled out as it was not present on all of the T2-weighted images.  Has mild spinal stenosis C4-C5 to C6-C7.  MRI lumbar 10/28/2020 shows At L2-L3, there are degenerative changes causing mild spinal stenosis but no nerve root compression.  At  L3-L4, there are degenerative changes causing mild spinal stenosis and moderately severe left lateral recess stenosis and moderate right lateral recess stenosis.  There is potential for left L4 nerve root compression.  REVIEW OF SYSTEMS: Constitutional: No fevers, chills, sweats, or change in appetite Eyes: No visual changes, double vision, eye pain Ear, nose and throat: No hearing loss, ear pain, nasal congestion, sore throat Cardiovascular: No chest pain, palpitations Respiratory:  No shortness of breath at rest or with exertion.   No wheezes GastrointestinaI: No nausea, vomiting, diarrhea, abdominal pain, fecal incontinence Genitourinary:  No dysuria, urinary retention or frequency.  No nocturia. Musculoskeletal: As above  integumentary: No rash, pruritus, skin lesions Neurological: as above Psychiatric: No depression at this time.  No anxiety Endocrine: No palpitations, diaphoresis, change in appetite, change in weigh or increased thirst Hematologic/Lymphatic:  No anemia, purpura, petechiae. Allergic/Immunologic: No itchy/runny eyes, nasal congestion, recent allergic reactions, rashes  ALLERGIES: Allergies  Allergen Reactions   Lipitor [Atorvastatin Calcium] Other (See Comments)    Muscle pain, which he does not have with Rosuvastatin.    HOME MEDICATIONS:  Current Outpatient Medications:    DULoxetine (CYMBALTA) 60 MG capsule, Take 1 capsule (60 mg total) by mouth daily., Disp: 30 capsule, Rfl: 5   etodolac (LODINE) 400 MG tablet, Take 1 tablet (400 mg total) by mouth 2 (two) times daily., Disp: 60 tablet, Rfl: 5   losartan-hydrochlorothiazide (HYZAAR) 50-12.5 MG tablet, Take 1 tablet by mouth once daily, Disp: 60 tablet, Rfl: 0   methocarbamol (ROBAXIN) 500 MG tablet, Take 1 tablet (500 mg total) by mouth every 8 (eight) hours as needed for muscle spasms., Disp: 20 tablet, Rfl: 0   montelukast (SINGULAIR) 10 MG tablet, Take 1 tablet (10 mg total) by mouth daily., Disp: 30  tablet, Rfl: 5   rosuvastatin (CRESTOR) 20 MG tablet, 1/2 tab by mouth daily with evening meal, Disp: 30 tablet, Rfl: 6   Teriflunomide 14 MG TABS, TAKE 1 TABLET BY MOUTH DAILY  (NEEDS APPOINTMENT FOR REFILLS), Disp: 30 tablet, Rfl: 11   Vitamin D, Ergocalciferol, (DRISDOL) 1.25 MG (50000 UNIT) CAPS capsule, Take 1 capsule (50,000 Units total) by mouth every 7 (seven) days., Disp: 13 capsule, Rfl: 1   Carbinoxamine Maleate 4 MG TABS, TAKE 1 TABLET BY MOUTH TWICE DAILY AS NEEDED FOR RUNNY NOSE, Disp: 60 tablet, Rfl: 1   fluticasone (FLONASE) 50 MCG/ACT nasal spray, SHAKE LIQUID AND USE 2 SPRAYS IN EACH NOSTRIL EVERY DAY AS NEEDED FOR NASAL CONGESTION (Patient not taking: Reported on 10/21/2022), Disp: 16 g, Rfl: 5  PAST MEDICAL HISTORY: Past Medical History:  Diagnosis Date   Headache 03/2016   High blood pressure    High cholesterol     PAST SURGICAL HISTORY: Past Surgical History:  Procedure Laterality Date  Repair of left shoulder laceration     cut with broken glass bottle.  Missed a major artery by just a small measurement per patient    FAMILY HISTORY: Family History  Problem Relation Age of Onset   Hypertension Mother    Heart disease Mother        unknown open heart surgery   Hypertension Father    Peripheral vascular disease Father    Heart disease Brother        Unknown heart issue   Seizures Daughter     SOCIAL HISTORY:  Social History   Socioeconomic History   Marital status: Single    Spouse name: Not on file   Number of children: 2   Years of education: 11   Highest education level: 11th grade  Occupational History   Occupation: Estate manager/land agent job  Tobacco Use   Smoking status: Every Day    Current packs/day: 0.50    Average packs/day: 0.5 packs/day for 48.0 years (24.0 ttl pk-yrs)    Types: Cigarettes   Smokeless tobacco: Former  Building services engineer status: Never Used  Substance and Sexual Activity   Alcohol use: No    Comment: History of drinking too  much.  Stopped at age 73 yo   Drug use: Not Currently    Frequency: 1.0 times per week    Types: Marijuana    Comment: Now and then; history of crack cocaine   Sexual activity: Not on file  Other Topics Concern   Not on file  Social History Narrative   Lives alone in an apartment on the 10th floor.  Has 3 children but one is deceased.  Works at an Engineer, maintenance.  Education: 11th grade.   Coffee sometimes and MT Dew   Social Determinants of Health   Financial Resource Strain: Not on file  Food Insecurity: No Food Insecurity (07/27/2018)   Hunger Vital Sign    Worried About Running Out of Food in the Last Year: Never true    Ran Out of Food in the Last Year: Never true  Transportation Needs: No Transportation Needs (07/27/2018)   PRAPARE - Administrator, Civil Service (Medical): No    Lack of Transportation (Non-Medical): No  Physical Activity: Not on file  Stress: No Stress Concern Present (07/27/2018)   [employer] of Occupational Health - Occupational Stress Questionnaire    Feeling of Stress : Not at all  Social Connections: Unknown (07/27/2018)   Social Connection and Isolation Panel [NHANES]    Frequency of Communication with Friends and Family: Not on file    Frequency of Social Gatherings with Friends and Family: Not on file    Attends Religious Services: 1 to 4 times per year    Active Member of [employer] or Organizations: No    Attends Banker Meetings: Never    Marital Status: Not on file  Intimate Partner Violence: Not on file     PHYSICAL EXAM  Vitals:   04/13/23 1056  BP: (!) 159/80  Pulse: 90  Weight: 211 lb (95.7 kg)  Height: 5\' 6"  (1.676 m)     Body mass index is 34.06 kg/m.   General: The patient is well-developed and well-nourished and in no acute distress muscles are tender in the thighs to deep palpation but not in the calves or shoulders  HEENT:  Head is Williams/AT.  Sclera are anicteric.     Neck:  .  The neck is  nontender.  Skin: Extremities are without rash or  edema.  Neurologic Exam  Mental status: The patient is alert and oriented x 3 at the time of the examination. The patient has apparent normal recent and remote memory, with an apparently normal attention span and concentration ability.   Speech is normal.  Cranial nerves: Extraocular movements are full.  Facial symmetry is present.  Facial strength was normal.  No dysarthria is noted.    No obvious hearing deficits are noted.  Motor:  Muscle bulk is normal.   Tone is mildly increased in the left leg. Strength is  4+/5 in iliopsoas and 5 / 5 elsewhere   Sensory: Sensory testing is intact to pinprick, soft touch and vibration sensation in the right arm and leg.  He reported mildly reduced sensation to touch in the C7 distribution of the left hand.  Some reduced sensation near the ankle on the left to touch, symmetric sensation to vibration.  Coordination: Cerebellar testing reveals good finger-nose-finger and heel-to-shin bilaterally.  Gait and station: Station is normal.   The gait is mildly spastic, noticeable more on the left.  Tandem gait is wide. .. Romberg is negative.   Reflexes: Deep tendon reflexes are symmetric and normal bilaterally.        DIAGNOSTIC DATA (LABS, IMAGING, TESTING) - I reviewed patient records, labs, notes, testing and imaging myself where available.  Lab Results  Component Value Date   WBC 8.8 10/21/2022   HGB 15.5 10/21/2022   HCT 46.0 10/21/2022   MCV 84 10/21/2022   PLT 196 10/21/2022      Component Value Date/Time   NA 141 12/13/2020 1407   K 4.0 12/13/2020 1407   CL 103 12/13/2020 1407   CO2 19 (L) 12/13/2020 1407   GLUCOSE 92 12/13/2020 1407   GLUCOSE 93 04/22/2019 1252   BUN 11 12/13/2020 1407   CREATININE 0.96 12/13/2020 1407   CALCIUM 9.9 12/13/2020 1407   PROT 6.8 10/21/2022 1441   ALBUMIN 4.5 10/21/2022 1441   AST 20 10/21/2022 1441   ALT 23 10/21/2022 1441   ALKPHOS 57 10/21/2022  1441   BILITOT 0.2 10/21/2022 1441   GFRNONAA >60 04/22/2019 1252   GFRAA >60 04/22/2019 1252   Lab Results  Component Value Date   CHOL 143 10/11/2018   HDL 44 10/11/2018   LDLCALC 77 10/11/2018   TRIG 110 10/11/2018   CHOLHDL 5.3 03/28/2016   Lab Results  Component Value Date   HGBA1C 5.9 (H) 03/27/2016   No results found for: "VITAMINB12" Lab Results  Component Value Date   TSH 2.180 10/21/2022       ASSESSMENT AND PLAN  Multiple sclerosis (HCC) - Plan: Hepatic function panel, CBC with Differential/Platelet  High risk medication use - Plan: Hepatic function panel, CBC with Differential/Platelet  Gait disturbance  Vitamin D deficiency  Spinal stenosis of lumbosacral region   1.  Continue teriflunomide.   Check   LFT, CBC with differential   2.   Continue Etodolac 400 mg twice dail,    Duloxetine for pain   3.   Stay active and exercise as tolerated. 4.   Vit D was low earlier in year will send in a high dose supplement. Return in 5-6 months or sooner if new or worsening neurologic symptoms.      This visit is part of a comprehensive longitudinal care medical relationship regarding the patients primary diagnosis of MS and related concerns.   Neria Procter A. Epimenio Foot, MD, [facility] 04/13/2023, 11:46 AM  Certified in Neurology, Clinical Neurophysiology, Sleep Medicine and Neuroimaging  [facility] [address], [address] [place], [place] [address] [phone]

## 2023-04-23 ENCOUNTER — Telehealth: Payer: Self-pay | Admitting: Neurology

## 2023-04-23 NOTE — Telephone Encounter (Signed)
Called pt and was unable to reach him.

## 2023-04-23 NOTE — Telephone Encounter (Signed)
Pt called requesting to speak to an RN regarding pain that he is experiencing in his back and legs. Please advise.

## 2023-04-27 ENCOUNTER — Other Ambulatory Visit: Payer: Self-pay | Admitting: Neurology

## 2023-04-27 MED ORDER — PREGABALIN 75 MG PO CAPS: 75.0000 mg | ORAL_CAPSULE | Freq: Two times a day (BID) | ORAL | 3 refills | Status: DC

## 2023-04-27 NOTE — Telephone Encounter (Signed)
I called patient. He reports that over the past few weeks the pain in his legs has worsened. He describes it has a tight achy feeling in his thighs. He started back at work recently and his walking has increased which contributes to his pain. He reports the duloxetine works well for his back but not his legs.   He has tried baclofen and tizanidine in the past but they made him too sleepy. He reports that gabapentin "didn't work."  Will discuss with Dr. Epimenio Foot.

## 2023-04-27 NOTE — Addendum Note (Signed)
Addended by: Geronimo Running A on: 04/27/2023 04:14 PM   Modules accepted: Orders

## 2023-04-27 NOTE — Telephone Encounter (Signed)
I spoke with Dr. Epimenio Foot. He recommends Lyrica 75mg  BID.  I called patient. I discussed this with him. He is agreeable to trying Lyrica and will let us know of any side effects including drowsiness. I reminded him not to drive until he is sure that the Lyrica will not cause somnolence. Pt verbalized understanding.

## 2023-04-27 NOTE — Telephone Encounter (Signed)
Pt reporting that both his legs bother him all day, he would like a call to discuss.

## 2023-05-20 ENCOUNTER — Telehealth: Payer: Self-pay | Admitting: Neurology

## 2023-05-20 NOTE — Telephone Encounter (Signed)
I called pt and relayed phone # for [facility] for him to call and check on his Teriflunomide tablets. (From [facility] he is not consistent with taking this medication).  He has one year refills on this from 12-2022.  He said was having hard time with his legs/pain, and wanted to see if he could increase the pregabalin  (now on 75mg  po bid.).  I relayed that will ask Dr. Epimenio Foot about increase but he also needed to schedule 6 month follow up appt   I will get phone staff to do this.

## 2023-05-20 NOTE — Telephone Encounter (Signed)
Pt is requesting a refill for Teriflunomide 14 MG TABS.  Pharmacy: [facility] Pt states he is down to 3 pills and he is normally contacted by the pharmacy, but he has not heard from anyone.

## 2023-05-20 NOTE — Telephone Encounter (Signed)
Pt has called back to inform he will get his medication on tomorrow, this is FYI to POD 1 no call back requested

## 2023-05-20 NOTE — Telephone Encounter (Signed)
Ok, that is for the Teriflunomide.

## 2023-06-11 ENCOUNTER — Other Ambulatory Visit: Payer: Self-pay | Admitting: Neurology

## 2023-07-22 ENCOUNTER — Other Ambulatory Visit: Payer: Self-pay | Admitting: Neurology

## 2023-07-22 NOTE — Telephone Encounter (Signed)
Last seen on 04/13/23 No 6 month follow up scheduled

## 2023-10-09 ENCOUNTER — Other Ambulatory Visit: Payer: Self-pay | Admitting: Neurology

## 2023-10-13 NOTE — Telephone Encounter (Signed)
Dr. Epimenio Foot- were you going to continue to write this for him? No mention in last OV note

## 2023-10-19 ENCOUNTER — Emergency Department (HOSPITAL_COMMUNITY): Payer: 59

## 2023-10-19 ENCOUNTER — Other Ambulatory Visit: Payer: Self-pay

## 2023-10-19 ENCOUNTER — Emergency Department (HOSPITAL_COMMUNITY)
Admission: EM | Admit: 2023-10-19 | Discharge: 2023-10-19 | Disposition: A | Discharge status: Home / Self Care | Payer: 59 | Attending: Emergency Medicine | Admitting: Emergency Medicine

## 2023-10-19 DIAGNOSIS — W1830XA Fall on same level, unspecified, initial encounter: Secondary | ICD-10-CM | POA: Insufficient documentation

## 2023-10-19 DIAGNOSIS — M25512 Pain in left shoulder: Secondary | ICD-10-CM | POA: Diagnosis present

## 2023-10-19 DIAGNOSIS — W19XXXA Unspecified fall, initial encounter: Secondary | ICD-10-CM

## 2023-10-19 MED ORDER — HYDROCODONE-ACETAMINOPHEN 5-325 MG PO TABS
1.0000 | ORAL_TABLET | Freq: Once | ORAL | Status: AC
  Administered 2023-10-19: 1 via ORAL
  Filled 2023-10-19: qty 1

## 2023-10-19 MED ORDER — CARBINOXAMINE MALEATE 4 MG PO TABS: ORAL_TABLET | ORAL | 0 refills | Status: DC

## 2023-10-19 NOTE — ED Provider Triage Note (Signed)
Emergency Medicine Provider Triage Evaluation Note  Patrick Gilbert , a 68 y.o. male  was evaluated in triage.  Pt complains of L shoulder pain after fall. Was choking on food and fell on L shoulder from a standing position  Review of Systems  Positive: See above Negative: Syncope, head strike  Physical Exam  BP 128/77 (BP Location: Right Arm)   Pulse 95   Temp 97.8 F (36.6 C) (Oral)   Resp 16   SpO2 99%  Gen:   Awake, no distress   Resp:  Normal effort  MSK:   Moves extremities without difficulty Other:  TTP over anterior L shoulder  Medical Decision Making  Medically screening exam initiated at 12:13 PM.  Appropriate orders placed.  [name] was informed that the remainder of the evaluation will be completed by another provider, this initial triage assessment does not replace that evaluation, and the importance of remaining in the ED until their evaluation is complete.   Rondel Baton, MD 10/19/23 [phone]

## 2023-10-19 NOTE — ED Provider Notes (Addendum)
[place] EMERGENCY DEPARTMENT AT [facility] Provider Note   CSN: [id number] Arrival date & time: 10/19/23  1127     History  Chief Complaint  Patient presents with   Fall   Shoulder Pain    RHYS LICHTY is a 68 y.o. male.  68 year old male with a history of MS and thoracic spine pinched nerves who presents emergency department after a fall.  Patient reports that he was eating something when he started choking and then fell.  Landed on his left shoulder.  No head strike or LOC.  Says that he was able to cough up all of the food.  No shortness of breath at this point in time.  No other injuries as result of the fall.  Denies numbness or weakness of his arm or hand. Is right handed.        Home Medications Prior to Admission medications   Medication Sig Start Date End Date Taking? Authorizing Provider  Carbinoxamine Maleate 4 MG TABS TAKE 1 TABLET BY MOUTH TWICE DAILY AS NEEDED FOR RUNNY NOSE 10/19/23   Rondel Baton, MD  DULoxetine (CYMBALTA) 60 MG capsule TAKE 1 CAPSULE(60 MG) BY MOUTH DAILY 07/22/23   Sater, Pearletha Furl, MD  etodolac (LODINE) 400 MG tablet Take 1 tablet (400 mg total) by mouth 2 (two) times daily. 10/21/22   Sater, Pearletha Furl, MD  fluticasone (FLONASE) 50 MCG/ACT nasal spray SHAKE LIQUID AND USE 2 SPRAYS IN EACH NOSTRIL EVERY DAY AS NEEDED FOR NASAL CONGESTION Patient not taking: Reported on 10/21/2022 09/02/21   Nehemiah Settle, FNP  losartan-hydrochlorothiazide [facility]) 50-12.5 MG tablet Take 1 tablet by mouth once daily 09/05/19   Julieanne Manson, MD  methocarbamol (ROBAXIN) 500 MG tablet Take 1 tablet (500 mg total) by mouth every 8 (eight) hours as needed for muscle spasms. 11/05/22   Carmel Sacramento A, PA-C  montelukast (SINGULAIR) 10 MG tablet Take 1 tablet (10 mg total) by mouth daily. 07/01/21   Nehemiah Settle, FNP  pregabalin (LYRICA) 75 MG capsule Take 1 capsule (75 mg total) by mouth 2 (two) times daily. 04/27/23   Sater, Pearletha Furl, MD   rosuvastatin (CRESTOR) 20 MG tablet 1/2 tab by mouth daily with evening meal 07/27/18   Julieanne Manson, MD  Teriflunomide 14 MG TABS TAKE 1 TABLET BY MOUTH DAILY  (NEEDS APPOINTMENT FOR REFILLS) 01/05/23   Sater, Pearletha Furl, MD  Vitamin D, Ergocalciferol, (DRISDOL) 1.25 MG (50000 UNIT) CAPS capsule Take 1 capsule (50,000 Units total) by mouth every 7 (seven) days. 04/13/23   Sater, Pearletha Furl, MD      Allergies    Lipitor [atorvastatin calcium]    Review of Systems   Review of Systems  Physical Exam Updated Vital Signs BP 128/77 (BP Location: Right Arm)   Pulse 95   Temp 97.8 F (36.6 C) (Oral)   Resp 16   SpO2 99%  Physical Exam HENT:     Head: Normocephalic and atraumatic.     Mouth/Throat:     Mouth: Mucous membranes are moist.     Pharynx: Oropharynx is clear.  Neck:     Comments: No C-spine midline tenderness palpation Cardiovascular:     Rate and Rhythm: Normal rate and regular rhythm.     Pulses: Normal pulses.     Heart sounds: Normal heart sounds.  Pulmonary:     Effort: Pulmonary effort is normal. No respiratory distress.     Breath sounds: Normal breath sounds. No stridor. No wheezing or rhonchi.  Musculoskeletal:     Comments: Symmetrically palpable radial and ulnar pulses. Capillary refill <2 seconds to all digits.  Intact sensation to light touch of the radial, median and ulnar nerves demonstrated by testing in the dorsal web space of the thumb, the hypothenar eminence of the palm, and the radial aspect of the dorsum of the hand.  Intact sensation to light touch of the left deltoid and biceps and triceps.  Intact motor function of the radial, median and ulnar nerves demonstrated by strength of hand grip, and spreading of the 2nd through 5th digits, thumb apposition, and ability to make "OK sign".  No tenderness palpation of the left clavicle.  Tenderness palpation of anterior shoulder on the left.  No snuffbox or other bony TTP of upper extremity.        ED Results / Procedures / Treatments   Labs (all labs ordered are listed, but only abnormal results are displayed) Labs Reviewed - No data to display  EKG None  Radiology DG Shoulder Left Result Date: 10/19/2023 CLINICAL DATA:  Left shoulder pain status post fall. EXAM: LEFT SHOULDER - 2+ VIEW COMPARISON:  Left shoulder radiographs 08/04/2017 FINDINGS: Moderate glenohumeral joint space narrowing and inferior glenoid degenerative spurring, similar to prior. Moderate distal lateral subacromial spurring, unchanged. This includes an approximate 9 mm spur directed anteriorly and inferiorly from the anterior aspect of the acromion on transscapular Y-view. Moderate acromioclavicular joint space narrowing and peripheral osteophytosis. No acute fracture or dislocation. The visualized portion of the left lung is unremarkable. IMPRESSION: 1. Moderate glenohumeral and acromioclavicular osteoarthritis, similar to prior. 2. Moderate distal lateral subacromial spurring, unchanged. 3. No acute fracture. Electronically Signed   By: Neita Garnet M.D.   On: 10/19/2023 13:00    Procedures Procedures    Medications Ordered in ED Medications  HYDROcodone-acetaminophen (NORCO/VICODIN) 5-325 MG per tablet 1 tablet (1 tablet Oral Given 10/19/23 1215)    ED Course/ Medical Decision Making/ A&P                                 Medical Decision Making Amount and/or Complexity of Data Reviewed Radiology: ordered.  Risk Prescription drug management.   TIMOHTY RENBARGER is a 68 y.o. male with comorbidities that complicate the patient evaluation including MS and thoracic spine pinched nerves who presents emergency department after a fall.   Initial Ddx:  Shoulder fracture, dislocation, clavicular fracture, soft tissue injury, neurovascular compromise, aspiration  MDM/Course:  Patient presents emergency department after a fall.  Did strike his left shoulder.  Does not have pain elsewhere in the arm.  No head  strike or LOC.  Does have some tenderness to palpation on the anterior aspect of the shoulder.  No clavicular tenderness palpation.  No obvious deformity.  Did also complain of some choking sensation just before falling.  I do not hear any abnormal lung sounds in the right lower lobe or elsewhere in his lungs.  Not complaining of shortness of breath.  Low concern for aspiration pneumonitis or pneumonia.  Had an x-ray that did not show acute fracture or dislocation.  Did show some osteoarthritis.  Patient was given a sling prior to imaging.  I have instructed him to wear it as needed for comfort for the next day or 2 but did not wear it afterwards since it can cause stiffness.  Instructed to take Tylenol and use over-the-counter lidocaine patches and topical treatments for his pain.  Upon follow-up with sports medicine orthopedics as well.  Was requesting a refill of his allergy medication which was sent to his pharmacy.  This patient presents to the ED for concern of complaints listed in HPI, this involves an extensive number of treatment options, and is a complaint that carries with it a high risk of complications and morbidity. Disposition including potential need for admission considered.   Dispo: DC Home. Return precautions discussed including, but not limited to, those listed in the AVS. Allowed pt time to ask questions which were answered fully prior to dc.  Records reviewed Outpatient Clinic Notes I independently reviewed the following imaging with scope of interpretation limited to determining acute life threatening conditions related to emergency care: Extremity x-ray(s) and agree with the radiologist interpretation with the following exceptions: none I have reviewed the patients home medications and made adjustments as needed Social Determinants of health:  Elderly  Portions of this note were generated with Scientist, clinical (histocompatibility and immunogenetics). Dictation errors may occur despite best attempts at  proofreading.     Final Clinical Impression(s) / ED Diagnoses Final diagnoses:  Fall, initial encounter  Acute pain of left shoulder    Rx / DC Orders ED Discharge Orders          Ordered    Carbinoxamine Maleate 4 MG TABS        10/19/23 1218              Rondel Baton, MD 10/19/23 1501

## 2023-10-19 NOTE — Discharge Instructions (Signed)
You were seen for your shoulder pain and fall in the emergency department.   At home, please ice your shoulder.  Use Tylenol and over-the-counter lidocaine patches for your pain.    You may wear the sling we have provided you for comfort over the next 48 hours.  I recommend against wearing it past that amount of time since it can cause stiffness of the joint.  Check your MyChart online for the results of any tests that had not resulted by the time you left the emergency department.   Follow-up with sports medicine or orthopedics within the next week to discuss her shoulder pain if it is not getting better.  Return immediately to the emergency department if you experience any of the following: Worsening pain, numbness or weakness of your arm, or any other concerning symptoms.    Thank you for visiting our Emergency Department. It was a pleasure taking care of you today.

## 2023-10-19 NOTE — ED Triage Notes (Signed)
Patient arrives from work where he was eating lunch, got choked on the food, which caused him to fall when he stood up. Did not pass out or hit his head. Landed on L shoulder and has pain to same. Hx of slipped discs and pinched nerve which causes chronic pain to that shoulder and arm but pain is worse since the fall.

## 2023-10-20 ENCOUNTER — Telehealth: Payer: Self-pay | Admitting: Neurology

## 2023-10-20 NOTE — Telephone Encounter (Signed)
Pt called was at work on 10/19/23 sitting down eating and got choked on food. Student started patting on my back and I was still getting choked. Next thing I knew was on the floor on left side. Cannot move my left shoulder. Would like a call from the nurse.

## 2023-10-20 NOTE — Telephone Encounter (Signed)
 Called pt. He was seen in ED yesterday at [facility] post fall. Told to f/u with sports medicine re: shoulder. I gave him phone# below to sports medicine call and schedule appt.   Also scheduled f/u for him to see Dr. Vear on 10/28/23 at 9am. He was due for follow up.

## 2023-10-25 ENCOUNTER — Encounter (HOSPITAL_COMMUNITY): Payer: Self-pay

## 2023-10-25 ENCOUNTER — Emergency Department (HOSPITAL_COMMUNITY)
Admission: EM | Admit: 2023-10-25 | Discharge: 2023-10-25 | Disposition: A | Discharge status: Home / Self Care | Payer: 59

## 2023-10-25 ENCOUNTER — Emergency Department (HOSPITAL_COMMUNITY): Payer: 59

## 2023-10-25 DIAGNOSIS — R0781 Pleurodynia: Secondary | ICD-10-CM | POA: Diagnosis present

## 2023-10-25 DIAGNOSIS — S2232XA Fracture of one rib, left side, initial encounter for closed fracture: Secondary | ICD-10-CM | POA: Diagnosis not present

## 2023-10-25 DIAGNOSIS — W19XXXA Unspecified fall, initial encounter: Secondary | ICD-10-CM | POA: Insufficient documentation

## 2023-10-25 MED ORDER — LIDOCAINE 5 % EX PTCH
1.0000 | MEDICATED_PATCH | Freq: Once | CUTANEOUS | Status: DC
  Administered 2023-10-25: 1 via TRANSDERMAL
  Filled 2023-10-25: qty 1

## 2023-10-25 MED ORDER — LIDOCAINE 5 % EX PTCH: 1.0000 | MEDICATED_PATCH | CUTANEOUS | 0 refills | Status: DC

## 2023-10-25 NOTE — ED Provider Triage Note (Signed)
 Emergency Medicine Provider Triage Evaluation Note  RHONIN TROTT , a 68 y.o. male  was evaluated in triage.  Pt complains of L sided rib pain since fall x 1wk ago.  Had shoulder x-ray at that time which was negative.  Review of Systems  Positive: Left-sided rib pain, worse with cough and deep inspiration Negative: Fever, chills, SHOB, CP  Physical Exam  BP (!) 142/99 (BP Location: Left Arm)   Pulse 90   Temp 98.4 F (36.9 C) (Oral)   Resp 17   Ht 5' 6 (1.676 m)   Wt 97.5 kg   SpO2 97%   BMI 34.70 kg/m  Gen:   Awake, no distress   Resp:  Normal effort  MSK:   Moves extremities without difficulty  Other:    Medical Decision Making  Medically screening exam initiated at 1:10 PM.  Appropriate orders placed.  Lynwood FORBES Lee was informed that the remainder of the evaluation will be completed by another provider, this initial triage assessment does not replace that evaluation, and the importance of remaining in the ED until their evaluation is complete.  Imaging ordered   Francis Ileana SAILOR, PA-C 10/25/23 1312

## 2023-10-25 NOTE — ED Triage Notes (Addendum)
 Pt reports he fell 1 week ago, was evaluated at ED . Reports was told had bruising. Pt here today c/o left rib pain since fall

## 2023-10-25 NOTE — ED Provider Notes (Signed)
 [place] EMERGENCY DEPARTMENT AT [facility] Provider Note   CSN: [id number] Arrival date & time: 10/25/23  1212     History  Chief Complaint  Patient presents with   Rib Injury    Patrick Gilbert is a 68 y.o. male.  This is a 68 year old male present emergency department for left chest wall pain/rib pain.  States that he fell last Monday onto his left shoulder and was evaluated in the emergency department.  Did not have much pain when he was evaluated in the ED, but subsequently developed pain to left lower rib.  Worsened with movement, deep inhalation or coughing.  Some improvement with ibuprofen 's.        Home Medications Prior to Admission medications   Medication Sig Start Date End Date Taking? Authorizing Provider  lidocaine  (LIDODERM ) 5 % Place 1 patch onto the skin daily. Remove & Discard patch within 12 hours or as directed by MD 10/25/23  Yes Neysa Caron PARAS, DO  Carbinoxamine  Maleate 4 MG TABS TAKE 1 TABLET BY MOUTH TWICE DAILY AS NEEDED FOR RUNNY NOSE 10/19/23   Yolande Lamar BROCKS, MD  DULoxetine  (CYMBALTA ) 60 MG capsule TAKE 1 CAPSULE(60 MG) BY MOUTH DAILY 07/22/23   Sater, Charlie LABOR, MD  etodolac  (LODINE ) 400 MG tablet Take 1 tablet (400 mg total) by mouth 2 (two) times daily. 10/21/22   Sater, Charlie LABOR, MD  fluticasone  (FLONASE ) 50 MCG/ACT nasal spray SHAKE LIQUID AND USE 2 SPRAYS IN EACH NOSTRIL EVERY DAY AS NEEDED FOR NASAL CONGESTION Patient not taking: Reported on 10/21/2022 09/02/21   Cheryl Reusing, FNP  losartan -hydrochlorothiazide  (HYZAAR) 50-12.5 MG tablet Take 1 tablet by mouth once daily 09/05/19   Adella Norris, MD  methocarbamol  (ROBAXIN ) 500 MG tablet Take 1 tablet (500 mg total) by mouth every 8 (eight) hours as needed for muscle spasms. 11/05/22   Suellen Cantor A, PA-C  montelukast  (SINGULAIR ) 10 MG tablet Take 1 tablet (10 mg total) by mouth daily. 07/01/21   Cheryl Reusing, FNP  pregabalin  (LYRICA ) 75 MG capsule Take 1 capsule (75 mg  total) by mouth 2 (two) times daily. 04/27/23   Sater, Charlie LABOR, MD  rosuvastatin  (CRESTOR ) 20 MG tablet 1/2 tab by mouth daily with evening meal 07/27/18   Adella Norris, MD  Teriflunomide  14 MG TABS TAKE 1 TABLET BY MOUTH DAILY  (NEEDS APPOINTMENT FOR REFILLS) 01/05/23   Sater, Charlie LABOR, MD  Vitamin D , Ergocalciferol , (DRISDOL ) 1.25 MG (50000 UNIT) CAPS capsule Take 1 capsule (50,000 Units total) by mouth every 7 (seven) days. 04/13/23   Sater, Charlie LABOR, MD      Allergies    Lipitor [atorvastatin  calcium ]    Review of Systems   Review of Systems  Physical Exam Updated Vital Signs BP (!) 170/100 (BP Location: Right Arm)   Pulse 81   Temp 98.1 F (36.7 C)   Resp 14   Ht 5' 6 (1.676 m)   Wt 97.5 kg   SpO2 100%   BMI 34.70 kg/m  Physical Exam  ED Results / Procedures / Treatments   Labs (all labs ordered are listed, but only abnormal results are displayed) Labs Reviewed - No data to display  EKG None  Radiology DG Ribs Unilateral W/Chest Left Result Date: 10/25/2023 CLINICAL DATA:  Left rib pain after fall. EXAM: LEFT RIBS AND CHEST - 3+ VIEW COMPARISON:  Chest x-ray dated July 02, 2021. FINDINGS: Acute minimally displaced fracture of the left anterior sixth rib. There is no evidence of  pneumothorax or pleural effusion. Both lungs are clear. Heart size and mediastinal contours are within normal limits. IMPRESSION: 1. Acute minimally displaced fracture of the left anterior sixth rib. 2.  No active cardiopulmonary disease. Electronically Signed   By: Elsie ONEIDA Shoulder M.D.   On: 10/25/2023 14:24    Procedures Procedures    Medications Ordered in ED Medications  lidocaine  (LIDODERM ) 5 % 1 patch (1 patch Transdermal Patch Applied 10/25/23 1345)    ED Course/ Medical Decision Making/ A&P Clinical Course as of 10/25/23 1559  Sun Oct 25, 2023  1432 DG Ribs Unilateral W/Chest Left MPRESSION: 1. Acute minimally displaced fracture of the left anterior sixth rib. 2.  No  active cardiopulmonary disease.   [TY]    Clinical Course User Index [TY] Neysa Caron PARAS, DO                                 Medical Decision Making Well-appearing 68 year old male presenting emergency department for left chest wall pain after a fall roughly 1 week ago.  Not having any shortness of breath.  Maintaining oxygen saturation on room air.  Some tenderness to the left anterior rib on exam.  Chest x-ray with acute left sixth rib fracture, no pneumonia.  Reports pain mostly controlled at home with ibuprofen .  Given a lidocaine  patch here.  Given incentive inspirometer.  Follow-up with primary doctor.  Stable for discharge at this time.  Amount and/or Complexity of Data Reviewed External Data Reviewed:     Details: Reviewed prior ED encounter, only x-ray shoulder.  Documented that he had no chest wall pain at that time.  No further trauma or falls per patient report. Labs:     Details: Considered labs, however given vital signs and point tenderness to chest in the setting of his fall low suspicion for acute metabolic derangements or systemic infection.  His labs unlikely to change management or disposition at this time, therefore we will forego them Radiology:  Decision-making details documented in ED Course.    Details: I do not appreciate acute fracture on my independent review, but radiology does note 6 rib fracture.  Risk Prescription drug management. Decision regarding hospitalization. Risk Details: Given patient's reassuring vitals, not requiring oxygen and pain seemingly controlled that he is appropriate for discharge with outpatient follow-up.         Final Clinical Impression(s) / ED Diagnoses Final diagnoses:  Closed fracture of one rib of left side, initial encounter    Rx / DC Orders ED Discharge Orders          Ordered    lidocaine  (LIDODERM ) 5 %  Every 24 hours        10/25/23 1433              Neysa Caron PARAS, DO 10/25/23 1559

## 2023-10-25 NOTE — Discharge Instructions (Addendum)
 You have a broken rib.  You may use Tylenol  alternating with ibuprofen  for baseline pain.  We are also prescribing you lidocaine  patches to further help with your pain.  Please use the incentive inspirometer that we have provided you to prevent pnuemonia.

## 2023-10-28 ENCOUNTER — Encounter: Payer: Self-pay | Admitting: Neurology

## 2023-10-28 ENCOUNTER — Ambulatory Visit (INDEPENDENT_AMBULATORY_CARE_PROVIDER_SITE_OTHER): Payer: 59 | Admitting: Neurology

## 2023-10-28 VITALS — BP 135/88 | HR 88 | Ht 66.0 in | Wt 215.5 lb

## 2023-10-28 DIAGNOSIS — Z79899 Other long term (current) drug therapy: Secondary | ICD-10-CM

## 2023-10-28 DIAGNOSIS — E559 Vitamin D deficiency, unspecified: Secondary | ICD-10-CM

## 2023-10-28 DIAGNOSIS — G35 Multiple sclerosis: Secondary | ICD-10-CM | POA: Diagnosis not present

## 2023-10-28 DIAGNOSIS — R200 Anesthesia of skin: Secondary | ICD-10-CM

## 2023-10-28 DIAGNOSIS — R739 Hyperglycemia, unspecified: Secondary | ICD-10-CM

## 2023-10-28 DIAGNOSIS — R269 Unspecified abnormalities of gait and mobility: Secondary | ICD-10-CM | POA: Diagnosis not present

## 2023-10-28 DIAGNOSIS — S2232XA Fracture of one rib, left side, initial encounter for closed fracture: Secondary | ICD-10-CM

## 2023-10-28 MED ORDER — TERIFLUNOMIDE 14 MG PO TABS: ORAL_TABLET | ORAL | 11 refills | Status: AC

## 2023-10-28 NOTE — Progress Notes (Signed)
[facility]  PATIENT: Patrick Gilbert DOB: 1956/09/04  REFERRING DOCTOR OR PCP:   Norva Riffle PA SOURCE: Patient, notes from primary care  _________________________________   HISTORICAL  CHIEF COMPLAINT:  Chief Complaint  Patient presents with   Room 11    Pt is here Alone. Pt states that he fell last Monday and fractured a rib. Pt states that he would like to get A1C checked today. Pt states that he has been doing good. Pt needs refill on his medication.     HISTORY OF PRESENT ILLNESS:  Patrick Gilbert is a 67 year old man who has had many years of left leg numbness.     Update 10/28/2023: On 10/19/2023, he was choking on food at work when he stood up and apparently started shaking on his lft side and then fell to the floor and then was fine.   He landed on the left shoulder.   He went to the ED.   He continued to have pain, especially with pressure or deep inspiration and went back to the ED 10/25/2023 and was found to have one fractured rib (left 6th).    Vit D was low in past and he took supplements but none recently.      He is on Aubagio (teriflunomide) and tolerates it well.  LFTs have been good.    He has not had any new MS symptoms since starting the medication.      He reportst pain in his legs, left > right, mostly in his thighs  Pain is worse after exercise.    Pain is achy.     The gait is mildly wide and off balance.  He stumbles at times.  He needs to use the banister going up and down stairs.   He continues to have some pain into the arms, left greater than right.  He has stiffness in the left leg and sometimes the left hand (though this is better compared to last year). Marland Kitchen  He feels left leg not weak anymore though is still a little stiff.   He does not note weakness in the arms.   Duloxetine has helped the tingling .   Robaxin helps spasticity some.   Tizanidine and baclofen made him sleepy.   He has had more LBP and has known DJD.  Legs no longer hurt  and he is walking better  He repots he has had hyperglycemia but was not diagnosed with DM  .       MS history:  Due to gait disturbance, pain and numbness, in 2022, he had MRI of the cervical spine and brain.  The MRI of the spine shows 2 lesions, 1 posterolaterally to the left adjacent to C5-C6 and another centrally adjacent to T2.  There are degenerative changes but no significant spinal stenosis.  Repeat MRI of the brain 11/15/2020 showed multiple lesions predominantly in the hemispheres but also in the right cerebellar hemisphere consistent with MS.  It is likely that some of the foci are chronic microvascular ischemic change as well.  Compared to the previous MRI from 2019, there are a few new lesions.He was started on teriflunomide in 2022.  .      Imaging review: MRI brain 11/12/2022 showed Multiple T2/FLAIR hyperintense foci in the cerebral hemispheres and in the right cerebellar hemisphere in a pattern and configuration consistent with chronic demyelinating plaque associated with multiple sclerosis. None of the foci enhanced or appear to be acute. Compared to the  MRI from 11/15/2020, there were no new lesions.   MRI brain 11/15/2020 shows T2/FLAIR hyperintense foci in the periventricular, juxtacortical, subcortical and deep white matter both cerebral hemispheres and the right cerebellar hemisphere.  Most of the foci are nonspecific though some foci are radially oriented to the ventricles in the periventricular white matter and in the juxtacortical white matter which could be more consistent with chronic demyelinating plaque.  There is likely superimposed chronic microvascular ischemic change.  None of the foci were acute or enhanced.  However, a couple small foci in the cerebral hemispheres were not readily apparent on the 2019 MRI.    MRI of the cervical spine 10/28/2020 shows a T2 hyperintense focus posteriorly adjacent to C5-C6 noted on the axial images and the sagittal STIR images but not on  the sagittal T2-weighted images.  This could represents a focus of demyelination or less likely ischemic change.  Artifact cannot be ruled out as it was not present on all of the T2-weighted images.  Has mild spinal stenosis C4-C5 to C6-C7.  MRI lumbar 10/28/2020 shows At L2-L3, there are degenerative changes causing mild spinal stenosis but no nerve root compression.  At L3-L4, there are degenerative changes causing mild spinal stenosis and moderately severe left lateral recess stenosis and moderate right lateral recess stenosis.  There is potential for left L4 nerve root compression.  REVIEW OF SYSTEMS: Constitutional: No fevers, chills, sweats, or change in appetite Eyes: No visual changes, double vision, eye pain Ear, nose and throat: No hearing loss, ear pain, nasal congestion, sore throat Cardiovascular: No chest pain, palpitations Respiratory:  No shortness of breath at rest or with exertion.   No wheezes GastrointestinaI: No nausea, vomiting, diarrhea, abdominal pain, fecal incontinence Genitourinary:  No dysuria, urinary retention or frequency.  No nocturia. Musculoskeletal: As above  integumentary: No rash, pruritus, skin lesions Neurological: as above Psychiatric: No depression at this time.  No anxiety Endocrine: No palpitations, diaphoresis, change in appetite, change in weigh or increased thirst Hematologic/Lymphatic:  No anemia, purpura, petechiae. Allergic/Immunologic: No itchy/runny eyes, nasal congestion, recent allergic reactions, rashes  ALLERGIES: Allergies  Allergen Reactions   Lipitor [Atorvastatin Calcium] Other (See Comments)    Muscle pain, which he does not have with Rosuvastatin.    HOME MEDICATIONS:  Current Outpatient Medications:    Carbinoxamine Maleate 4 MG TABS, TAKE 1 TABLET BY MOUTH TWICE DAILY AS NEEDED FOR RUNNY NOSE, Disp: 20 tablet, Rfl: 0   DULoxetine (CYMBALTA) 60 MG capsule, TAKE 1 CAPSULE(60 MG) BY MOUTH DAILY, Disp: 30 capsule, Rfl: 2    etodolac (LODINE) 400 MG tablet, Take 1 tablet (400 mg total) by mouth 2 (two) times daily., Disp: 60 tablet, Rfl: 5   fluticasone (FLONASE) 50 MCG/ACT nasal spray, SHAKE LIQUID AND USE 2 SPRAYS IN EACH NOSTRIL EVERY DAY AS NEEDED FOR NASAL CONGESTION, Disp: 16 g, Rfl: 5   lidocaine (LIDODERM) 5 %, Place 1 patch onto the skin daily. Remove & Discard patch within 12 hours or as directed by MD, Disp: 30 patch, Rfl: 0   losartan-hydrochlorothiazide (HYZAAR) 50-12.5 MG tablet, Take 1 tablet by mouth once daily, Disp: 60 tablet, Rfl: 0   methocarbamol (ROBAXIN) 500 MG tablet, Take 1 tablet (500 mg total) by mouth every 8 (eight) hours as needed for muscle spasms., Disp: 20 tablet, Rfl: 0   montelukast (SINGULAIR) 10 MG tablet, Take 1 tablet (10 mg total) by mouth daily., Disp: 30 tablet, Rfl: 5   pregabalin (LYRICA) 75 MG capsule, Take 1  capsule (75 mg total) by mouth 2 (two) times daily., Disp: 60 capsule, Rfl: 3   rosuvastatin (CRESTOR) 20 MG tablet, 1/2 tab by mouth daily with evening meal, Disp: 30 tablet, Rfl: 6   Teriflunomide 14 MG TABS, TAKE 1 TABLET BY MOUTH DAILY  (NEEDS APPOINTMENT FOR REFILLS), Disp: 30 tablet, Rfl: 11   Vitamin D, Ergocalciferol, (DRISDOL) 1.25 MG (50000 UNIT) CAPS capsule, Take 1 capsule (50,000 Units total) by mouth every 7 (seven) days., Disp: 13 capsule, Rfl: 1  PAST MEDICAL HISTORY: Past Medical History:  Diagnosis Date   Headache 03/2016   High blood pressure    High cholesterol     PAST SURGICAL HISTORY: Past Surgical History:  Procedure Laterality Date   Repair of left shoulder laceration     cut with broken glass bottle.  Missed a major artery by just a small measurement per patient    FAMILY HISTORY: Family History  Problem Relation Age of Onset   Hypertension Mother    Heart disease Mother        unknown open heart surgery   Hypertension Father    Peripheral vascular disease Father    Heart disease Brother        Unknown heart issue   Seizures  Daughter    Multiple sclerosis Cousin     SOCIAL HISTORY:  Social History   Socioeconomic History   Marital status: Single    Spouse name: Not on file   Number of children: 2   Years of education: 11   Highest education level: 11th grade  Occupational History   Occupation: Estate manager/land agent job  Tobacco Use   Smoking status: Every Day    Current packs/day: 0.50    Average packs/day: 0.5 packs/day for 48.0 years (24.0 ttl pk-yrs)    Types: Cigarettes   Smokeless tobacco: Former  Building services engineer status: Never Used  Substance and Sexual Activity   Alcohol use: No    Comment: History of drinking too much.  Stopped at age 62 yo   Drug use: Not Currently    Frequency: 1.0 times per week    Types: Marijuana    Comment: Now and then; history of crack cocaine   Sexual activity: Not on file  Other Topics Concern   Not on file  Social History Narrative   Lives alone in an apartment on the 10th floor.  Has 3 children but one is deceased.  Works at an Engineer, maintenance.  Education: 11th grade.   Coffee sometimes and MT Dew   Social Drivers of Health   Financial Resource Strain: Not on file  Food Insecurity: No Food Insecurity (07/27/2018)   Hunger Vital Sign    Worried About Running Out of Food in the Last Year: Never true    Ran Out of Food in the Last Year: Never true  Transportation Needs: No Transportation Needs (07/27/2018)   PRAPARE - Administrator, Civil Service (Medical): No    Lack of Transportation (Non-Medical): No  Physical Activity: Not on file  Stress: No Stress Concern Present (07/27/2018)   [employer] of Occupational Health - Occupational Stress Questionnaire    Feeling of Stress : Not at all  Social Connections: Unknown (07/27/2018)   Social Connection and Isolation Panel [NHANES]    Frequency of Communication with Friends and Family: Not on file    Frequency of Social Gatherings with Friends and Family: Not on file    Attends Religious  Services: 1 to  4 times per year    Active Member of Clubs or Organizations: No    Attends Banker Meetings: Never    Marital Status: Not on file  Intimate Partner Violence: Not on file     PHYSICAL EXAM  Vitals:   10/28/23 0855  BP: 135/88  Pulse: 88  Weight: 215 lb 8 oz (97.8 kg)  Height: 5\' 6"  (1.676 m)     Body mass index is 34.78 kg/m.   General: The patient is well-developed and well-nourished and in no acute distress muscles ,   Tender over left chest.    HEENT:  Head is [place]/AT.  Sclera are anicteric.     Neck:  .  The neck is nontender.   Skin: Extremities are without rash or  edema.  Neurologic Exam  Mental status: The patient is alert and oriented x 3 at the time of the examination. The patient has apparent normal recent and remote memory, with an apparently normal attention span and concentration ability.   Speech is normal.  Cranial nerves: Extraocular movements are full.  Normal facial sensation. .  Facial strength was normal.  No dysarthria is noted.    No obvious hearing deficits are noted.  Motor:  Muscle bulk is normal.   Tone is mildly increased in the left leg. Strength is  4+/5 in iliopsoas and 5 / 5 elsewhere   Sensory: Sensory testing is intact to pinprick, soft touch and vibration sensation in the arms and right leg.  He has reduced sensation near the ankle on the left to touch, symmetric sensation to vibration.  Coordination: Cerebellar testing reveals good finger-nose-finger and heel-to-shin bilaterally.  Gait and station: Station is normal.   The gait is mildly spastic, noticeable more on the left.  Tandem gait is wide. .. Romberg is negative.   Reflexes: Deep tendon reflexes are symmetric and normal in arms and slightly increased left leg. Marland Kitchen        DIAGNOSTIC DATA (LABS, IMAGING, TESTING) - I reviewed patient records, labs, notes, testing and imaging myself where available.  Lab Results  Component Value Date   WBC 4.9  04/13/2023   HGB 16.3 04/13/2023   HCT 49.0 04/13/2023   MCV 85 04/13/2023   PLT 169 04/13/2023      Component Value Date/Time   NA 141 12/13/2020 1407   K 4.0 12/13/2020 1407   CL 103 12/13/2020 1407   CO2 19 (L) 12/13/2020 1407   GLUCOSE 92 12/13/2020 1407   GLUCOSE 93 04/22/2019 1252   BUN 11 12/13/2020 1407   CREATININE 0.96 12/13/2020 1407   CALCIUM 9.9 12/13/2020 1407   PROT 7.0 04/13/2023 1148   ALBUMIN 4.6 04/13/2023 1148   AST 20 04/13/2023 1148   ALT 35 04/13/2023 1148   ALKPHOS 69 04/13/2023 1148   BILITOT 0.6 04/13/2023 1148   GFRNONAA >60 04/22/2019 1252   GFRAA >60 04/22/2019 1252   Lab Results  Component Value Date   CHOL 143 10/11/2018   HDL 44 10/11/2018   LDLCALC 77 10/11/2018   TRIG 110 10/11/2018   CHOLHDL 5.3 03/28/2016   Lab Results  Component Value Date   HGBA1C 5.9 (H) 03/27/2016   No results found for: "VITAMINB12" Lab Results  Component Value Date   TSH 2.180 10/21/2022       ASSESSMENT AND PLAN  Multiple sclerosis (HCC)  High risk medication use  Gait disturbance  Numbness  Vitamin D deficiency  Closed fracture of one rib of left  side, initial encounter   1.  Continue teriflunomide for now.  Would consider stopping DMT at age 14   Check  LFT, CBC with differential   2.   Continue Etodolac 400 mg twice dail,    continue Duloxetine for pain  and pregabalin  3.   Stay active and exercise as tolerated. 4.   Vit D was low in past will recheck and supplement based on results of Vit D test today 5.   Spell was related to choking, if additional spells would consider EEG Return in 6 months or sooner if new or worsening neurologic symptoms.      This visit is part of a comprehensive longitudinal care medical relationship regarding the patients primary diagnosis of MS and related concerns.   Cleatis Fandrich A. Epimenio Foot, MD, [facility] 10/28/2023, 9:33 AM Certified in Neurology, Clinical Neurophysiology, Sleep Medicine and  Neuroimaging  [facility] [address], [address] [place], [place] [address] [phone]

## 2023-10-29 ENCOUNTER — Telehealth: Payer: Self-pay

## 2023-10-29 ENCOUNTER — Other Ambulatory Visit: Payer: Self-pay | Admitting: Neurology

## 2023-10-29 LAB — CBC WITH DIFFERENTIAL/PLATELET
Basophils Absolute: 0 10*3/uL (ref 0.0–0.2)
Basos: 1 %
EOS (ABSOLUTE): 0.2 10*3/uL (ref 0.0–0.4)
Eos: 3 %
Hematocrit: 47.1 % (ref 37.5–51.0)
Hemoglobin: 15.8 g/dL (ref 13.0–17.7)
Immature Grans (Abs): 0 10*3/uL (ref 0.0–0.1)
Immature Granulocytes: 0 %
Lymphocytes Absolute: 2.1 10*3/uL (ref 0.7–3.1)
Lymphs: 36 %
MCH: 28.6 pg (ref 26.6–33.0)
MCHC: 33.5 g/dL (ref 31.5–35.7)
MCV: 85 fL (ref 79–97)
Monocytes Absolute: 0.6 10*3/uL (ref 0.1–0.9)
Monocytes: 10 %
Neutrophils Absolute: 2.8 10*3/uL (ref 1.4–7.0)
Neutrophils: 50 %
Platelets: 240 10*3/uL (ref 150–450)
RBC: 5.52 x10E6/uL (ref 4.14–5.80)
RDW: 13.6 % (ref 11.6–15.4)
WBC: 5.7 10*3/uL (ref 3.4–10.8)

## 2023-10-29 LAB — HEMOGLOBIN A1C
Est. average glucose Bld gHb Est-mCnc: 169 mg/dL
Hgb A1c MFr Bld: 7.5 % — ABNORMAL HIGH (ref 4.8–5.6)

## 2023-10-29 LAB — VITAMIN D 25 HYDROXY (VIT D DEFICIENCY, FRACTURES): Vit D, 25-Hydroxy: 15.1 ng/mL — ABNORMAL LOW (ref 30.0–100.0)

## 2023-10-29 LAB — HEPATIC FUNCTION PANEL
ALT: 27 [IU]/L (ref 0–44)
AST: 22 [IU]/L (ref 0–40)
Albumin: 4.8 g/dL (ref 3.9–4.9)
Alkaline Phosphatase: 80 [IU]/L (ref 44–121)
Bilirubin Total: 0.4 mg/dL (ref 0.0–1.2)
Bilirubin, Direct: 0.16 mg/dL (ref 0.00–0.40)
Total Protein: 7.3 g/dL (ref 6.0–8.5)

## 2023-10-29 MED ORDER — VITAMIN D (ERGOCALCIFEROL) 1.25 MG (50000 UNIT) PO CAPS: 50000.0000 [IU] | ORAL_CAPSULE | ORAL | 3 refills | Status: DC

## 2023-10-29 NOTE — Telephone Encounter (Addendum)
Called pt and let him know his Lab Results per Dr. Epimenio Foot. Pt verbalized understanding.   ----- Message from Asa Lente sent at 10/29/2023  8:26 AM EST ----- Let him know: 1.  The vitamin D was low and I went ahead and sent in a high-dose supplement.  He should take 1 a week.  Because he has been low multiple times we will just keep him on 1 a week for the time being. 2.  The hemoglobin A1c was elevated and he could have mild diabetes.  He should discuss further with his primary care doctor.  I do not know if they are part of colon or not but we can fax results if he lets [place] know who to send him to

## 2023-10-29 NOTE — Progress Notes (Signed)
Called pt and let him know is lab results (KRS)

## 2023-11-08 ENCOUNTER — Other Ambulatory Visit: Payer: Self-pay | Admitting: Neurology

## 2024-02-18 ENCOUNTER — Other Ambulatory Visit: Payer: Self-pay | Admitting: Neurology

## 2024-03-06 ENCOUNTER — Other Ambulatory Visit: Payer: Self-pay | Admitting: Neurology

## 2024-03-07 ENCOUNTER — Other Ambulatory Visit: Payer: Self-pay

## 2024-03-07 NOTE — Telephone Encounter (Signed)
 Last seen on 10/28/23 Follow up scheduled on 05/04/24   I didn't see in note if you wanted pt to continue taking Rx?

## 2024-03-07 NOTE — Telephone Encounter (Signed)
 Dr.Sater please see the below    Dispensed Days Supply Quantity Provider Pharmacy  CARBINOXAMINE  4MG  TABLETS 01/06/2024 10 20 each Yolande Lamar BROCKS, MD [facility]  CARBINOXAMINE  4MG  TABLETS 12/12/2023 30 60 each Sater, Charlie LABOR, MD [facility]  CARBINOXAMINE  4MG  TABLETS 10/14/2023 30 60 each Sater, Charlie LABOR, MD [facility]  CARBINOXAMINE  4MG  TABLETS 09/05/2023 30 60 each Sater, Charlie LABOR, MD [facility]  CARBINOXAMINE  4MG  TABLETS 07/22/2023 30 60 each Sater, Charlie LABOR, MD [facility]  CARBINOXAMINE  4MG  TABLETS 05/06/2023 30 60 each Sater, Charlie LABOR, MD [facility]  CARBINOXAMINE  4MG  TABLETS 04/13/2023 30 60 each Sater, Charlie LABOR, MD [facility]      The last prescriber was Dr.Robert Yolande (ER provider)

## 2024-04-01 ENCOUNTER — Telehealth: Payer: Self-pay | Admitting: Neurology

## 2024-04-01 NOTE — Telephone Encounter (Signed)
 Pt reports the medication for his leg pain is not helping,his legs are causing him a lot of pain, please call pt to discuss.

## 2024-04-04 MED ORDER — PREGABALIN 100 MG PO CAPS: 100.0000 mg | ORAL_CAPSULE | Freq: Two times a day (BID) | ORAL | 5 refills | Status: AC

## 2024-04-04 NOTE — Telephone Encounter (Signed)
 Took call from Annie/phone room. Spoke w/ pt. He is agreeable to plan. Sent to MD to e-scribe rx.

## 2024-04-04 NOTE — Telephone Encounter (Signed)
 Pt called and states that the wrong medication was filled Pt states he is suppose to be taking  celecoxibe 200mg  . I informed Pt that not medication that was on list the patient would like to be called for clarity . Pt states medication is suppose to be 200 mg ,Not sure where this information came from .the patient is requesting call back

## 2024-04-04 NOTE — Telephone Encounter (Signed)
 Last saw Dr. Vear 10/28/23. MS DMT: teriflunomide . Per last note:  Continue Etodolac  400 mg twice daily,  continue Duloxetine  for pain and pregabalin   Should be taking as follows: Lyrica  75mg  po BID and duloxetine  60mg  po BID   I called pt. Reports legs ache more in the morning and at night while sleeping. Fell at work a couple months ago. Bruised ribs. Feels fall r/t leg weakness. Confirmed he has taking teriflunomide  daily. Denies any infection/illness. Feels sx also r/t nerves. Has numbness down legs that is intermittent. Aware I will send to Dr. Vear to review and we will call back.

## 2024-04-04 NOTE — Telephone Encounter (Signed)
 Called pt. Relayed Dr. Duncan recommendation. Call ended up going silent on his end. I called back but got VM. LVM for him to call.

## 2024-04-04 NOTE — Telephone Encounter (Signed)
 Called pt back. He has been taking celebrex 200mg  po every day. Thinks [facility] writes this.   He has not been taking Pregabalin  75mg  (last filled 04/2023). He wants to know if he should take celebrex and/or take with lyrica  75mg ? Aware I will send to Dr. Vear and call back.

## 2024-04-04 NOTE — Telephone Encounter (Signed)
 Patient returned phone call, transferred to POD1

## 2024-05-04 ENCOUNTER — Ambulatory Visit (INDEPENDENT_AMBULATORY_CARE_PROVIDER_SITE_OTHER): Payer: 59 | Admitting: Family Medicine

## 2024-05-04 ENCOUNTER — Encounter: Payer: Self-pay | Admitting: Family Medicine

## 2024-05-04 VITALS — BP 129/84 | HR 76 | Ht 66.0 in | Wt 216.0 lb

## 2024-05-04 DIAGNOSIS — E559 Vitamin D deficiency, unspecified: Secondary | ICD-10-CM

## 2024-05-04 DIAGNOSIS — G35 Multiple sclerosis: Secondary | ICD-10-CM

## 2024-05-04 DIAGNOSIS — R269 Unspecified abnormalities of gait and mobility: Secondary | ICD-10-CM

## 2024-05-04 DIAGNOSIS — Z79899 Other long term (current) drug therapy: Secondary | ICD-10-CM | POA: Diagnosis not present

## 2024-05-04 DIAGNOSIS — M791 Myalgia, unspecified site: Secondary | ICD-10-CM

## 2024-05-04 DIAGNOSIS — M5416 Radiculopathy, lumbar region: Secondary | ICD-10-CM

## 2024-05-04 DIAGNOSIS — R200 Anesthesia of skin: Secondary | ICD-10-CM

## 2024-05-04 MED ORDER — ETODOLAC 400 MG PO TABS: 400.0000 mg | ORAL_TABLET | Freq: Two times a day (BID) | ORAL | 1 refills | Status: AC

## 2024-05-04 MED ORDER — VITAMIN D (ERGOCALCIFEROL) 1.25 MG (50000 UNIT) PO CAPS: 50000.0000 [IU] | ORAL_CAPSULE | ORAL | 3 refills | Status: DC

## 2024-05-04 NOTE — Progress Notes (Signed)
 Chief Complaint  Patient presents with   RM2/MS    Pt is here Alone. Pt states that both of his legs hurt and are stiff. Pt states that his legs ache. Pt states that he is having a lot of fatigue. Pt states that he would like something to help with his pain, he states that he takes Duloxetine  and Lyrica  and neither help with his pain. Pt states that his legs are throbbing right now.  Pt would like to get X-rays on his legs.     HISTORY OF PRESENT ILLNESS:  05/04/24 ALL:  Patrick Gilbert is a 68 y.o. male here today for follow up for MS. He continues Aubagio . Last MRI   He denies any obvious exacerbation. He reports worsening pain of lower extremities. He describes more stiffness in the mornings. Right leg aches constantly. Feels cold and numb. Has pain in left leg. He works in maintenance at [employer]. Works 7 hours a day 3-4 days a week. No falls. Does not use assistive device.   He continues pregabalin  100mg  BID but unclear if it helps. He failed gabapentin  in the past. MRI lumbar spine showed multilevel degenerative changes but no clear nerve root compression. He does not want to repeat imaging.   Etodolac  400mg  BID previously prescribed but he has not taken. Unclear why. He doesn't remember taking it in the past.  Not taking Celebrex.  Not taking methocarbamol . Unsure who wrote this and why.   He continues duloxetine  60mg  daily. Not sure it helps with pain. Mood is good. No depression. Sleeps well. He lives alone. He does not drive. Uses public transportation and friends that he can call when needed. His mom, sister and daughter live close by. Cognition is good. He is able to manage home independently.   Vitamin D  rx sent at last visit. Level was 15.1. A1C was 7.5. he was started on an oral medication at bedtime but unsure of name. Maybe glipizide.    HISTORY (copied from Dr Duncan previous note)  Patrick Gilbert is a 68 year old man who has had many years of left leg numbness.       Update 10/28/2023: On 10/19/2023, he was choking on food at work when he stood up and apparently started shaking on his lft side and then fell to the floor and then was fine.   He landed on the left shoulder.   He went to the ED.   He continued to have pain, especially with pressure or deep inspiration and went back to the ED 10/25/2023 and was found to have one fractured rib (left 6th).     Vit D was low in past and he took supplements but none recently.       He is on Aubagio  (teriflunomide ) and tolerates it well.  LFTs have been good.    He has not had any new MS symptoms since starting the medication.       He reportst pain in his legs, left > right, mostly in his thighs  Pain is worse after exercise.    Pain is achy.      The gait is mildly wide and off balance.  He stumbles at times.  He needs to use the banister going up and down stairs.   He continues to have some pain into the arms, left greater than right.  He has stiffness in the left leg and sometimes the left hand (though this is better compared to last year). SABRA  He feels left leg not weak anymore though is still a little stiff.   He does not note weakness in the arms.   Duloxetine  has helped the tingling .   Robaxin  helps spasticity some.   Tizanidine  and baclofen  made him sleepy.   He has had more LBP and has known DJD.  Legs no longer hurt and he is walking better   He repots he has had hyperglycemia but was not diagnosed with DM   MS history:  Due to gait disturbance, pain and numbness, in 2022, he had MRI of the cervical spine and brain.  The MRI of the spine shows 2 lesions, 1 posterolaterally to the left adjacent to C5-C6 and another centrally adjacent to T2.  There are degenerative changes but no significant spinal stenosis.  Repeat MRI of the brain 11/15/2020 showed multiple lesions predominantly in the hemispheres but also in the right cerebellar hemisphere consistent with MS.  It is likely that some of the foci are chronic  microvascular ischemic change as well.  Compared to the previous MRI from 2019, there are a few new lesions.He was started on teriflunomide  in 2022.  .    Imaging review: MRI brain 11/12/2022 showed Multiple T2/FLAIR hyperintense foci in the cerebral hemispheres and in the right cerebellar hemisphere in a pattern and configuration consistent with chronic demyelinating plaque associated with multiple sclerosis. None of the foci enhanced or appear to be acute. Compared to the MRI from 11/15/2020, there were no new lesions.    MRI brain 11/15/2020 shows T2/FLAIR hyperintense foci in the periventricular, juxtacortical, subcortical and deep white matter both cerebral hemispheres and the right cerebellar hemisphere.  Most of the foci are nonspecific though some foci are radially oriented to the ventricles in the periventricular white matter and in the juxtacortical white matter which could be more consistent with chronic demyelinating plaque.  There is likely superimposed chronic microvascular ischemic change.  None of the foci were acute or enhanced.  However, a couple small foci in the cerebral hemispheres were not readily apparent on the 2019 MRI.     MRI of the cervical spine 10/28/2020 shows a T2 hyperintense focus posteriorly adjacent to C5-C6 noted on the axial images and the sagittal STIR images but not on the sagittal T2-weighted images.  This could represents a focus of demyelination or less likely ischemic change.  Artifact cannot be ruled out as it was not present on all of the T2-weighted images.  Has mild spinal stenosis C4-C5 to C6-C7.   MRI lumbar 10/28/2020 shows At L2-L3, there are degenerative changes causing mild spinal stenosis but no nerve root compression.  At L3-L4, there are degenerative changes causing mild spinal stenosis and moderately severe left lateral recess stenosis and moderate right lateral recess stenosis.  There is potential for left L4 nerve root compression.   REVIEW OF SYSTEMS:  Out of a complete 14 system review of symptoms, the patient complains only of the following symptoms, lower ext pain, numbness, stiffness and all other reviewed systems are negative.   ALLERGIES: Allergies  Allergen Reactions   Lipitor [Atorvastatin  Calcium ] Other (See Comments)    Muscle pain, which he does not have with Rosuvastatin .     HOME MEDICATIONS: Outpatient Medications Prior to Visit  Medication Sig Dispense Refill   Carbinoxamine  Maleate 4 MG TABS TAKE 1 TABLET BY MOUTH TWICE DAILY AS NEEDED FOR RUNNY NOSE. 60 tablet 3   DULoxetine  (CYMBALTA ) 60 MG capsule TAKE 1 CAPSULE(60 MG) BY MOUTH DAILY 30  capsule 7   fluticasone  (FLONASE ) 50 MCG/ACT nasal spray SHAKE LIQUID AND USE 2 SPRAYS IN EACH NOSTRIL EVERY DAY AS NEEDED FOR NASAL CONGESTION 16 g 5   losartan -hydrochlorothiazide  (HYZAAR) 50-12.5 MG tablet Take 1 tablet by mouth once daily 60 tablet 0   pregabalin  (LYRICA ) 100 MG capsule Take 1 capsule (100 mg total) by mouth 2 (two) times daily. 60 capsule 5   rosuvastatin  (CRESTOR ) 20 MG tablet 1/2 tab by mouth daily with evening meal 30 tablet 6   Teriflunomide  14 MG TABS One po qd 30 tablet 11   montelukast  (SINGULAIR ) 10 MG tablet Take 1 tablet (10 mg total) by mouth daily. (Patient not taking: Reported on 05/04/2024) 30 tablet 5   etodolac  (LODINE ) 400 MG tablet Take 1 tablet (400 mg total) by mouth 2 (two) times daily. (Patient not taking: Reported on 05/04/2024) 60 tablet 5   lidocaine  (LIDODERM ) 5 % Place 1 patch onto the skin daily. Remove & Discard patch within 12 hours or as directed by MD (Patient not taking: Reported on 05/04/2024) 30 patch 0   methocarbamol  (ROBAXIN ) 500 MG tablet Take 1 tablet (500 mg total) by mouth every 8 (eight) hours as needed for muscle spasms. (Patient not taking: Reported on 05/04/2024) 20 tablet 0   Vitamin D , Ergocalciferol , (DRISDOL ) 1.25 MG (50000 UNIT) CAPS capsule Take 1 capsule (50,000 Units total) by mouth every 7 (seven) days. (Patient not  taking: Reported on 05/04/2024) 13 capsule 3   No facility-administered medications prior to visit.     PAST MEDICAL HISTORY: Past Medical History:  Diagnosis Date   Headache 03/2016   High blood pressure    High cholesterol      PAST SURGICAL HISTORY: Past Surgical History:  Procedure Laterality Date   Repair of left shoulder laceration     cut with broken glass bottle.  Missed a major artery by just a small measurement per patient     FAMILY HISTORY: Family History  Problem Relation Age of Onset   Hypertension Mother    Heart disease Mother        unknown open heart surgery   Hypertension Father    Peripheral vascular disease Father    Heart disease Brother        Unknown heart issue   Seizures Daughter    Multiple sclerosis Cousin      SOCIAL HISTORY: Social History   Socioeconomic History   Marital status: Single    Spouse name: Not on file   Number of children: 2   Years of education: 11   Highest education level: 11th grade  Occupational History   Occupation: Estate manager/land agent job  Tobacco Use   Smoking status: Every Day    Current packs/day: 0.50    Average packs/day: 0.5 packs/day for 48.0 years (24.0 ttl pk-yrs)    Types: Cigarettes   Smokeless tobacco: Former  Building services engineer status: Never Used  Substance and Sexual Activity   Alcohol use: No    Comment: History of drinking too much.  Stopped at age 52 yo   Drug use: Not Currently    Frequency: 1.0 times per week    Types: Marijuana    Comment: Now and then; history of crack cocaine   Sexual activity: Not on file  Other Topics Concern   Not on file  Social History Narrative   Lives alone in an apartment on the 10th floor.  Has 3 children but one is deceased.  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  Education: 11th grade.   Coffee sometimes and MT Dew   Social Drivers of Health   Financial Resource Strain: Not on file  Food Insecurity: No Food Insecurity (07/27/2018)   Hunger Vital Sign     Worried About Running Out of Food in the Last Year: Never true    Ran Out of Food in the Last Year: Never true  Transportation Needs: No Transportation Needs (07/27/2018)   PRAPARE - Administrator, Civil Service (Medical): No    Lack of Transportation (Non-Medical): No  Physical Activity: Not on file  Stress: No Stress Concern Present (07/27/2018)   [employer] of Occupational Health - Occupational Stress Questionnaire    Feeling of Stress : Not at all  Social Connections: Unknown (07/27/2018)   Social Connection and Isolation Panel    Frequency of Communication with Friends and Family: Not on file    Frequency of Social Gatherings with Friends and Family: Not on file    Attends Religious Services: 1 to 4 times per year    Active Member of Clubs or Organizations: No    Attends Banker Meetings: Never    Marital Status: Not on file  Intimate Partner Violence: Not on file     PHYSICAL EXAM  Vitals:   05/04/24 1323  BP: 129/84  Pulse: 76  SpO2: 98%  Weight: 216 lb (98 kg)  Height: 5' 6 (1.676 m)   Body mass index is 34.86 kg/m.  Generalized: Well developed, in no acute distress  Cardiology: normal rate and rhythm, no murmur auscultated  Respiratory: clear to auscultation bilaterally    Neurological examination  Mentation: Alert oriented to time, place, history taking. Follows all commands speech and language fluent Cranial nerve II-XII: Pupils were equal round reactive to light. Extraocular movements were full, visual field were full on confrontational test. Facial sensation and strength were normal. Uvula tongue midline. Head turning and shoulder shrug  were normal and symmetric. Motor: The motor testing reveals 5 over 5 strength of all 4 extremities. Good symmetric motor tone is noted throughout.  Sensory: Sensory testing is intact to soft touch on all 4 extremities. No evidence of extinction is noted.  Coordination: Cerebellar testing  reveals good finger-nose-finger and heel-to-shin bilaterally.  Gait and station: Gait is arthritic    DIAGNOSTIC DATA (LABS, IMAGING, TESTING) - I reviewed patient records, labs, notes, testing and imaging myself where available.  Lab Results  Component Value Date   WBC 5.7 10/28/2023   HGB 15.8 10/28/2023   HCT 47.1 10/28/2023   MCV 85 10/28/2023   PLT 240 10/28/2023      Component Value Date/Time   NA 141 12/13/2020 1407   K 4.0 12/13/2020 1407   CL 103 12/13/2020 1407   CO2 19 (L) 12/13/2020 1407   GLUCOSE 92 12/13/2020 1407   GLUCOSE 93 04/22/2019 1252   BUN 11 12/13/2020 1407   CREATININE 0.96 12/13/2020 1407   CALCIUM  9.9 12/13/2020 1407   PROT 7.3 10/28/2023 0942   ALBUMIN 4.8 10/28/2023 0942   AST 22 10/28/2023 0942   ALT 27 10/28/2023 0942   ALKPHOS 80 10/28/2023 0942   BILITOT 0.4 10/28/2023 0942   GFRNONAA >60 04/22/2019 1252   GFRAA >60 04/22/2019 1252   Lab Results  Component Value Date   CHOL 143 10/11/2018   HDL 44 10/11/2018   LDLCALC 77 10/11/2018   TRIG 110 10/11/2018   CHOLHDL 5.3 03/28/2016   Lab Results  Component  Value Date   HGBA1C 7.5 (H) 10/28/2023   No results found for: VITAMINB12 Lab Results  Component Value Date   TSH 2.180 10/21/2022        No data to display               No data to display           ASSESSMENT AND PLAN  68 y.o. year old male  has a past medical history of Headache (03/2016), High blood pressure, and High cholesterol. here with    Multiple sclerosis (HCC) - Plan: CBC with Differential/Platelets, Hepatic Function Panel, Ambulatory referral to Physical Therapy  High risk medication use  Gait disturbance - Plan: Ambulatory referral to Physical Therapy  Vitamin D  deficiency - Plan: Vitamin D , 25-hydroxy  Numbness  Myalgia  Lumbar radiculopathy  Lynwood FORBES Lee reports worsening pain over the past year. We will continue pregabalin  100mg  BID and duloxetine  60mg  daily. I will have him  restart etodolac  400mg  BID. Referral to PT placed. Consider repeat lumbar MRI if he is willing. NCS/EMG if needed. Consider pain management referral if needed. We will update labs. Continue generic Aubagio . Encouraged to stay active. Fall precautions and healthy lifestyle habits encouraged. He will follow up with PCP as directed. He will return to see Dr Vear in 6 months, sooner if needed. He verbalizes understanding and agreement with this plan.   Orders Placed This Encounter  Procedures   CBC with Differential/Platelets   Hepatic Function Panel   Vitamin D , 25-hydroxy   Ambulatory referral to Physical Therapy    Referral Priority:   Routine    Referral Type:   Physical Medicine    Referral Reason:   Specialty Services Required    Requested Specialty:   Physical Therapy    Number of Visits Requested:   1     Meds ordered this encounter  Medications   etodolac  (LODINE ) 400 MG tablet    Sig: Take 1 tablet (400 mg total) by mouth 2 (two) times daily.    Dispense:  180 tablet    Refill:  1    Supervising Provider:   AHERN, ANTONIA B [[id number]]   Vitamin D , Ergocalciferol , (DRISDOL ) 1.25 MG (50000 UNIT) CAPS capsule    Sig: Take 1 capsule (50,000 Units total) by mouth every 7 (seven) days.    Dispense:  13 capsule    Refill:  3    Supervising Provider:   AHERN, ANTONIA B [id number]   I personally spent a total of 40 minutes in the care of the patient today including preparing to see the patient, getting/reviewing separately obtained history, performing a medically appropriate exam/evaluation, counseling and educating, placing orders, referring and communicating with other health care professionals, documenting clinical information in the EHR, independently interpreting results, and communicating results.   Greig Forbes, MSN, FNP-C 05/04/2024, 3:21 PM  [facility] [address] [place], [place] [address] [phone]

## 2024-05-04 NOTE — Patient Instructions (Signed)
 Below is our plan:  We will continue Aubagio . Continue pregabalin  100mg  twice daily and duloxetine  60mg  daily. Add etodalac 400mg  twice daily. I will update labs, today. We will send new prescription for vitamin D  to the pharmacy.   Consider repeat MRI of lumbar spine and NCS/EMG study if needed. Can consider pain management referral if needed.   Please make sure you are staying well hydrated. I recommend 50-60 ounces daily. Well balanced diet and regular exercise encouraged. Consistent sleep schedule with 6-8 hours recommended.   Please continue follow up with care team as directed.   Follow up with Dr Vear in 6 months.   You may receive a survey regarding today's visit. I encourage you to leave honest feed back as I do use this information to improve patient care. Thank you for seeing me today!

## 2024-05-05 LAB — CBC WITH DIFFERENTIAL/PLATELET
Basophils Absolute: 0 x10E3/uL (ref 0.0–0.2)
Basos: 1 %
EOS (ABSOLUTE): 0.2 x10E3/uL (ref 0.0–0.4)
Eos: 3 %
Hematocrit: 42.6 % (ref 37.5–51.0)
Hemoglobin: 13.8 g/dL (ref 13.0–17.7)
Immature Grans (Abs): 0 x10E3/uL (ref 0.0–0.1)
Immature Granulocytes: 0 %
Lymphocytes Absolute: 1.7 x10E3/uL (ref 0.7–3.1)
Lymphs: 37 %
MCH: 27.9 pg (ref 26.6–33.0)
MCHC: 32.4 g/dL (ref 31.5–35.7)
MCV: 86 fL (ref 79–97)
Monocytes Absolute: 0.7 x10E3/uL (ref 0.1–0.9)
Monocytes: 14 %
Neutrophils Absolute: 2.1 x10E3/uL (ref 1.4–7.0)
Neutrophils: 45 %
Platelets: 163 x10E3/uL (ref 150–450)
RBC: 4.95 x10E6/uL (ref 4.14–5.80)
RDW: 14.6 % (ref 11.6–15.4)
WBC: 4.7 x10E3/uL (ref 3.4–10.8)

## 2024-05-05 LAB — HEPATIC FUNCTION PANEL
ALT: 21 IU/L (ref 0–44)
AST: 20 IU/L (ref 0–40)
Albumin: 4.5 g/dL (ref 3.9–4.9)
Alkaline Phosphatase: 64 IU/L (ref 44–121)
Bilirubin Total: 0.3 mg/dL (ref 0.0–1.2)
Bilirubin, Direct: 0.1 mg/dL (ref 0.00–0.40)
Total Protein: 6.5 g/dL (ref 6.0–8.5)

## 2024-05-05 LAB — VITAMIN D 25 HYDROXY (VIT D DEFICIENCY, FRACTURES): Vit D, 25-Hydroxy: 18.2 ng/mL — ABNORMAL LOW (ref 30.0–100.0)

## 2024-05-06 ENCOUNTER — Encounter: Payer: Self-pay | Admitting: Physical Therapy

## 2024-05-06 ENCOUNTER — Ambulatory Visit: Payer: Self-pay | Admitting: Family Medicine

## 2024-05-06 ENCOUNTER — Other Ambulatory Visit: Payer: Self-pay

## 2024-05-06 ENCOUNTER — Ambulatory Visit: Attending: Family Medicine | Admitting: Physical Therapy

## 2024-05-06 VITALS — BP 140/80 | HR 73

## 2024-05-06 DIAGNOSIS — M6281 Muscle weakness (generalized): Secondary | ICD-10-CM | POA: Diagnosis present

## 2024-05-06 DIAGNOSIS — R208 Other disturbances of skin sensation: Secondary | ICD-10-CM | POA: Diagnosis present

## 2024-05-06 DIAGNOSIS — R269 Unspecified abnormalities of gait and mobility: Secondary | ICD-10-CM | POA: Diagnosis not present

## 2024-05-06 DIAGNOSIS — G35 Multiple sclerosis: Secondary | ICD-10-CM | POA: Insufficient documentation

## 2024-05-06 DIAGNOSIS — R2681 Unsteadiness on feet: Secondary | ICD-10-CM | POA: Insufficient documentation

## 2024-05-06 DIAGNOSIS — R2689 Other abnormalities of gait and mobility: Secondary | ICD-10-CM | POA: Insufficient documentation

## 2024-05-06 MED ORDER — VITAMIN D (ERGOCALCIFEROL) 1.25 MG (50000 UNIT) PO CAPS: 50000.0000 [IU] | ORAL_CAPSULE | ORAL | 3 refills | Status: AC

## 2024-05-06 NOTE — Therapy (Signed)
 OUTPATIENT PHYSICAL THERAPY NEURO EVALUATION   Patient Name: Patrick Gilbert MRN: [id number] DOB:07-Apr-1956, 68 y.o., male Today's Date: 05/06/2024   PCP: [facility] REFERRING PROVIDER: Cary No, NP  END OF SESSION:  PT End of Session - 05/06/24 1505     Visit Number 1    Number of Visits 9   8 + eval   Date for PT Re-Evaluation 07/15/24   pushed out due to delay in scheduling   Authorization Type [employer] Dual Complete    Progress Note Due on Visit 10    PT Start Time 1453    PT Stop Time 1535    PT Time Calculation (min) 42 min    Equipment Utilized During Treatment Gait belt    Activity Tolerance Patient tolerated treatment well    Behavior During Therapy [facility] for tasks assessed/performed          Past Medical History:  Diagnosis Date   Headache 03/2016   High blood pressure    High cholesterol    Past Surgical History:  Procedure Laterality Date   Repair of left shoulder laceration     cut with broken glass bottle.  Missed a major artery by just a small measurement per patient   Patient Active Problem List   Diagnosis Date Noted   Seasonal allergic rhinitis due to pollen 05/07/2021   Multiple sclerosis (HCC) 12/13/2020   High risk medication use 12/13/2020   Gait disturbance 12/13/2020   Left leg pain 07/27/2018   Numbness 07/27/2018   Essential hypertension 07/27/2018   Hyperlipidemia 07/27/2018   Abnormal brain MRI    Chest pain 03/27/2016   AKI (acute kidney injury) (HCC) 03/27/2016   Headache 03/27/2016   Tobacco abuse 03/27/2016   Abnormal EKG    Cephalalgia    Pain in the chest     ONSET DATE: 3-4 months  REFERRING DIAG: G35 (ICD-10-CM) - Multiple sclerosis (HCC) R26.9 (ICD-10-CM) - Gait disturbance  THERAPY DIAG:  Other disturbances of skin sensation  Other abnormalities of gait and mobility  Muscle weakness (generalized)  Unsteadiness on feet  Rationale for Evaluation and Treatment: Rehabilitation  SUBJECTIVE:                                                                                                                                                                                              SUBJECTIVE STATEMENT: Pt reports a heaviness and coldness down the RLE.  He is unsure if this is arthritis, his MS, pinched nerve, or related to his slipped discs.  He has significant stiffness in his hip upon standing after prolonged sitting.  His gait instability he feels is related to this stiffness. Pt accompanied by: self (uses public transportation - Bus)  PERTINENT HISTORY: MS on Aubagio , HTN, tobacco abuse  PAIN:  Are you having pain? No  PRECAUTIONS: Fall  RED FLAGS: None   WEIGHT BEARING RESTRICTIONS: No  FALLS: Has patient fallen in last 6 months? Yes. Number of falls pt reports 1 fall when the cerebral palsy hit me and I fell to the floor after getting myself choked when I was eating  LIVING ENVIRONMENT: Lives with: lives alone Lives in: House/apartment Stairs: No - lives on 10th floor uses elevator Has following equipment at home: None  PLOF: Independent  PATIENT GOALS: my legs  OBJECTIVE:  Note: Objective measures were completed at Evaluation unless otherwise noted.  DIAGNOSTIC FINDINGS:  MRI Brain 11/13/2023  IMPRESSION: This MRI of the brain with and without contrast shows the following: Multiple T2/FLAIR hyperintense foci in the cerebral hemispheres and in the right cerebellar hemisphere in a pattern and configuration consistent with chronic demyelinating plaque associated with multiple sclerosis.  None of the foci enhanced or appear to be acute.  Compared to the MRI from 11/15/2020, there were no new lesions. Mild mucoperiosteal thickening and some of the paranasal sinuses. No acute findings.  Normal enhancement pattern.  MRI Thoracic Spine 11/13/2022 IMPRESSION: 1. Thin central T2/STIR hyperintensity within the thoracic cord extending from the T1-2 disc level to the T3-4 disc level, a span  of 4.5 cm. It is indeterminate as to whether this represents a small central cord lesion or thin syrinx. No cord expansion. No additional sites of focal thoracic cord signal abnormality, evaluation is slightly degraded by motion degradation. 2. Mild multilevel degenerative changes of the thoracic spine, as described above.  COGNITION: Overall cognitive status: Within functional limits for tasks assessed   SENSATION: Pt reports coldness in RLE > LLE  COORDINATION: LE RAMS:  WFL Heel-to-shin:  limited due to bilateral hip stiffness (L worse than R)  EDEMA:  None noted in BLE  MUSCLE TONE: None noted in BLE  POSTURE: forward head  LOWER EXTREMITY ROM:     Active  Right Eval Left Eval  Hip flexion Grossly WFL  Hip extension   Hip abduction   Hip adduction   Hip internal rotation   Hip external rotation   Knee flexion   Knee extension   Ankle dorsiflexion   Ankle plantarflexion    Ankle inversion    Ankle eversion     (Blank rows = not tested)  LOWER EXTREMITY MMT:    MMT Right Eval Left Eval  Hip flexion 5 5  Hip extension    Hip abduction 4 4  Hip adduction    Hip internal rotation    Hip external rotation    Knee flexion 4 4  Knee extension 4+; soreness in thighs 4+; soreness in thighs  Ankle dorsiflexion 5 5  Ankle plantarflexion    Ankle inversion    Ankle eversion    (Blank rows = not tested)  BED MOBILITY:  Findings: Sit to supine Complete Independence Supine to sit Complete Independence Rolling to Right Complete Independence Rolling to Left Complete Independence  TRANSFERS: Sit to stand: Complete Independence  Assistive device utilized: None     Stand to sit: Complete Independence  Assistive device utilized: None     Chair to chair: SBA  Assistive device utilized: None       RAMP:  Not tested  CURB:  Not tested  STAIRS: Not tested  GAIT: Findings: Gait Characteristics: step through pattern, decreased stride length, shuffling,  antalgic, trunk flexed, poor foot clearance- Right, and poor foot clearance- Left, Distance walked: various clinic distances, Assistive device utilized:None, Level of assistance: SBA, and Comments: Pt has increased stiffness noted by heavy forward lean on onset of standing and gait - improves with distance.  FUNCTIONAL TESTS:  5 times sit to stand: 14.87 sec no UE support, intermittent BLE touching chair on rise 2 minute walk test: TBA 10 meter walk test: 11.75 sec SBA no AD = 0.85 m/sec OR 2.81 ft/sec  PATIENT SURVEYS:  None completed due to time.                                                                                                                                TREATMENT DATE: 05/06/2024    PATIENT EDUCATION: Education details: Initial HEP.  Assessments used and outcome interpretations.  PT POC and goals to be set.  Pt has concerns about MS medication and long-term side-effects, PT defers specifics to MD but discourages stopping without MD instruction due to potential side effects or functional decline.  Discussed anatomy related to areas of stiffness and possible multiple contributors. Person educated: Patient Education method: Explanation, Demonstration, Verbal cues, and Handouts Education comprehension: verbalized understanding and needs further education  HOME EXERCISE PROGRAM: Access Code: [email or web address] URL: [email or web address] Date: 05/06/2024 Prepared by: Daved Bull  Exercises - Seated Hamstring Stretch  - 1 x daily - 7 x weekly - 1 sets - 2-3 reps - 30 seconds hold - Seated Gastroc Stretch with Strap  - 1 x daily - 7 x weekly - 1 sets - 2-3 reps - 30 seconds hold  GOALS: Goals reviewed with patient? Yes  SHORT TERM GOALS: Target date: 06/03/2024  Pt will be independent and compliant with introductory BLE stretching, strength and balance focused HEP in order to maintain functional progress and improve mobility. Baseline:  Established on  eval Goal status: INITIAL  2.  to be assessed w/ STG/LTG set as appropriate. Baseline: TBA Goal status: INITIAL  3.  Pt will decrease 5xSTS to </=12 seconds w/o UE support and w/ full BLE clearance in order to demonstrate decreased risk for falls and improved functional bilateral LE strength and power. Baseline: 14.87 sec no UE support, intermittent BLE touching chair on rise Goal status: INITIAL  4.  Pt will demonstrate a gait speed of >/=3.01 feet/sec in order to decrease risk for falls. Baseline: 2.81 ft/sec SBA no AD Goal status: INITIAL  LONG TERM GOALS: Target date: 07/01/2024  Pt will be independent and compliant with advanced and finalized BLE stretching, strength and balance focused HEP in order to maintain functional progress and improve mobility. Baseline: Established on eval Goal status: INITIAL  2.  to be assessed w/ STG/LTG set as appropriate. Baseline: TBA Goal status: INITIAL  3.  Pt will demonstrate a gait speed of >/=3.21 feet/sec in order to decrease  risk for falls. Baseline: 2.81 ft/sec SBA no AD Goal status: INITIAL  4.  Patient will be compliant to formal walking program >/= 3 days per week to improve aerobic tolerance and ambulatory mechanics. Baseline: To be established. Goal status: INITIAL  ASSESSMENT:  CLINICAL IMPRESSION: Patient is a 68 y.o. male who was seen today for physical therapy evaluation and treatment for gait abnormality related to pain and stiffness.  Pt has a significant PMH of MS on Aubagio , HTN, and tobacco abuse.  Identified impairments include antalgic gait, pain in bilateral thighs, decreased gait speed, and hip stiffness.  Evaluation via the following assessment tools: 5xSTS and indicate elevated fall risk.  to be assessed at coming session to further tailor walking program and assess endurance limitations.  He would benefit from skilled PT to address impairments as noted and progress towards long term goals.    OBJECTIVE IMPAIRMENTS: Abnormal gait, decreased activity tolerance, decreased balance, decreased endurance, decreased knowledge of condition, decreased knowledge of use of DME, decreased mobility, difficulty walking, decreased ROM, hypomobility, improper body mechanics, and postural dysfunction.   ACTIVITY LIMITATIONS: standing, squatting, stairs, transfers, and locomotion level  PARTICIPATION LIMITATIONS: community activity, occupation, and yard work  PERSONAL FACTORS: Age, Fitness, Past/current experiences, Time since onset of injury/illness/exacerbation, and 1-2 comorbidities: MS and HTN are also affecting patient's functional outcome.   REHAB POTENTIAL: Excellent  CLINICAL DECISION MAKING: Evolving/moderate complexity  EVALUATION COMPLEXITY: Moderate  PLAN:  PT FREQUENCY: 1x/week  PT DURATION: 8 weeks  PLANNED INTERVENTIONS: 97164- PT Re-evaluation, 97750- Physical Performance Testing, 97110-Therapeutic exercises, 97530- Therapeutic activity, [id number]- Neuromuscular re-education, 97535- Self Care, [id number]- Manual therapy, [id number]- Gait training, [phone]- Aquatic Therapy, [phone]- Electrical stimulation (manual), [phone] (1-2 muscles), [id number] (3+ muscles)- Dry Needling, Patient/Family education, Balance training, Stair training, Taping, Joint mobilization, Spinal mobilization, Vestibular training, DME instructions, Cryotherapy, and Moist heat  PLAN FOR NEXT SESSION: Expand HEP - include supine stretching for piriformis and glut as able, hip flexor stretching, supine therex and standing balance.  ASSESS - set goals.  PROM/PT stretching to BLE - focus on hip flexor and IR/ER.  SciFit for ROM/strength.  Pt interested in treadmill - establish walking program for 3 days a week!    Daved KATHEE Bull, PT, DPT 05/06/2024, 3:35 PM

## 2024-05-06 NOTE — Patient Instructions (Signed)
 Access Code: [email or web address] URL: [email or web address] Date: 05/06/2024 Prepared by: Daved Bull  Exercises - Seated Hamstring Stretch  - 1 x daily - 7 x weekly - 1 sets - 2-3 reps - 30 seconds hold - Seated Gastroc Stretch with Strap  - 1 x daily - 7 x weekly - 1 sets - 2-3 reps - 30 seconds hold

## 2024-05-09 NOTE — Telephone Encounter (Addendum)
 Called pt and was unable to LVM about below results.   ----- Message from Greig Forbes sent at 05/06/2024  7:58 AM EDT ----- Please let him know that his labs look good with the exception of his vitamin D  level. I am calling in another rx for vitamin D . He will take 1 capsule weekly. Remind him to continue this rx until he  returns to see Dr Vear. We will recheck levels at that time and determine needed dosing. TY! ----- Message ----- From: Interface, [facility] Lab Results In Sent: 05/05/2024   5:36 AM EDT To: Amy Lomax, NP

## 2024-05-11 NOTE — Telephone Encounter (Addendum)
 Called pt and let him know the below Lab Results per NP, Amy Lomax. Pt states that he is going to pick up his script and start taking his Vitamin D .   ----- Message from Amy Lomax sent at 05/06/2024  7:58 AM EDT ----- Please let him know that his labs look good with the exception of his vitamin D  level. I am calling in another rx for vitamin D . He will take 1 capsule weekly. Remind him to continue this rx until he  returns to see Dr Vear. We will recheck levels at that time and determine needed dosing. TY! ----- Message ----- From: Interface, [facility] Lab Results In Sent: 05/05/2024   5:36 AM EDT To: Amy Lomax, NP

## 2024-05-13 ENCOUNTER — Ambulatory Visit: Admitting: Physical Therapy

## 2024-05-13 ENCOUNTER — Encounter: Payer: Self-pay | Admitting: Physical Therapy

## 2024-05-13 VITALS — BP 120/66 | HR 76

## 2024-05-13 DIAGNOSIS — R208 Other disturbances of skin sensation: Secondary | ICD-10-CM

## 2024-05-13 DIAGNOSIS — M6281 Muscle weakness (generalized): Secondary | ICD-10-CM

## 2024-05-13 DIAGNOSIS — R2681 Unsteadiness on feet: Secondary | ICD-10-CM

## 2024-05-13 DIAGNOSIS — R2689 Other abnormalities of gait and mobility: Secondary | ICD-10-CM

## 2024-05-13 NOTE — Therapy (Signed)
 OUTPATIENT PHYSICAL THERAPY NEURO TREATMENT   Patient Name: Patrick Gilbert MRN: [id number] DOB:1955-09-21, 68 y.o., male Today's Date: 05/13/2024   PCP: [facility] REFERRING PROVIDER: Cary No, NP  END OF SESSION:  PT End of Session - 05/13/24 1238     Visit Number 2    Number of Visits 9   8 + eval   Date for PT Re-Evaluation 07/15/24   pushed out due to delay in scheduling   Authorization Type [employer] Dual Complete    Progress Note Due on Visit 10    PT Start Time 1232    PT Stop Time 1313    PT Time Calculation (min) 41 min    Equipment Utilized During Treatment Gait belt    Activity Tolerance Patient tolerated treatment well    Behavior During Therapy [facility] for tasks assessed/performed          Past Medical History:  Diagnosis Date   Headache 03/2016   High blood pressure    High cholesterol    Past Surgical History:  Procedure Laterality Date   Repair of left shoulder laceration     cut with broken glass bottle.  Missed a major artery by just a small measurement per patient   Patient Active Problem List   Diagnosis Date Noted   Seasonal allergic rhinitis due to pollen 05/07/2021   Multiple sclerosis (HCC) 12/13/2020   High risk medication use 12/13/2020   Gait disturbance 12/13/2020   Left leg pain 07/27/2018   Numbness 07/27/2018   Essential hypertension 07/27/2018   Hyperlipidemia 07/27/2018   Abnormal brain MRI    Chest pain 03/27/2016   AKI (acute kidney injury) (HCC) 03/27/2016   Headache 03/27/2016   Tobacco abuse 03/27/2016   Abnormal EKG    Cephalalgia    Pain in the chest     ONSET DATE: 3-4 months  REFERRING DIAG: G35 (ICD-10-CM) - Multiple sclerosis (HCC) R26.9 (ICD-10-CM) - Gait disturbance  THERAPY DIAG:  Other disturbances of skin sensation  Other abnormalities of gait and mobility  Muscle weakness (generalized)  Unsteadiness on feet  Rationale for Evaluation and Treatment: Rehabilitation  SUBJECTIVE:                                                                                                                                                                                              SUBJECTIVE STATEMENT: Pt reports persistent heaviness and coldness in BLE.  He reports a lot of tightness.  He has scheduled a follow-up appt with MD regarding this and is wanting x-rays or something to look in my legs.   Pt accompanied  by: self (uses public transportation - Bus)  PERTINENT HISTORY: MS on [place] , HTN, tobacco abuse  PAIN:  Are you having pain? Yes: NPRS scale: 9 Pain location: bilateral thighs Pain description: heavy, cold, sore Aggravating factors: walking Relieving factors: some drugs - pt elaborates them pills they give me  PRECAUTIONS: Fall  RED FLAGS: None   WEIGHT BEARING RESTRICTIONS: No  FALLS: Has patient fallen in last 6 months? Yes. Number of falls pt reports 1 fall when the cerebral palsy hit me and I fell to the floor after getting myself choked when I was eating  LIVING ENVIRONMENT: Lives with: lives alone Lives in: House/apartment Stairs: No - lives on 10th floor uses elevator Has following equipment at home: None  PLOF: Independent  PATIENT GOALS: my legs  OBJECTIVE:  Note: Objective measures were completed at Evaluation unless otherwise noted.  DIAGNOSTIC FINDINGS:  MRI Brain 11/13/2023  IMPRESSION: This MRI of the brain with and without contrast shows the following: Multiple T2/FLAIR hyperintense foci in the cerebral hemispheres and in the right cerebellar hemisphere in a pattern and configuration consistent with chronic demyelinating plaque associated with multiple sclerosis.  None of the foci enhanced or appear to be acute.  Compared to the MRI from 11/15/2020, there were no new lesions. Mild mucoperiosteal thickening and some of the paranasal sinuses. No acute findings.  Normal enhancement pattern.  MRI Thoracic Spine 11/13/2022 IMPRESSION: 1. Thin central  T2/STIR hyperintensity within the thoracic cord extending from the T1-2 disc level to the T3-4 disc level, a span of 4.5 cm. It is indeterminate as to whether this represents a small central cord lesion or thin syrinx. No cord expansion. No additional sites of focal thoracic cord signal abnormality, evaluation is slightly degraded by motion degradation. 2. Mild multilevel degenerative changes of the thoracic spine, as described above.  COGNITION: Overall cognitive status: Within functional limits for tasks assessed   SENSATION: Pt reports coldness in RLE > LLE  COORDINATION: LE RAMS:  WFL Heel-to-shin:  limited due to bilateral hip stiffness (L worse than R)  EDEMA:  None noted in BLE  MUSCLE TONE: None noted in BLE  POSTURE: forward head  LOWER EXTREMITY ROM:     Active  Right Eval Left Eval  Hip flexion Grossly WFL  Hip extension   Hip abduction   Hip adduction   Hip internal rotation   Hip external rotation   Knee flexion   Knee extension   Ankle dorsiflexion   Ankle plantarflexion    Ankle inversion    Ankle eversion     (Blank rows = not tested)  LOWER EXTREMITY MMT:    MMT Right Eval Left Eval  Hip flexion 5 5  Hip extension    Hip abduction 4 4  Hip adduction    Hip internal rotation    Hip external rotation    Knee flexion 4 4  Knee extension 4+; soreness in thighs 4+; soreness in thighs  Ankle dorsiflexion 5 5  Ankle plantarflexion    Ankle inversion    Ankle eversion    (Blank rows = not tested)  BED MOBILITY:  Findings: Sit to supine Complete Independence Supine to sit Complete Independence Rolling to Right Complete Independence Rolling to Left Complete Independence  TRANSFERS: Sit to stand: Complete Independence  Assistive device utilized: None     Stand to sit: Complete Independence  Assistive device utilized: None     Chair to chair: SBA  Assistive device utilized: None  RAMP:  Not tested  CURB:  Not  tested  STAIRS: Not tested GAIT: Findings: Gait Characteristics: step through pattern, decreased stride length, shuffling, antalgic, trunk flexed, poor foot clearance- Right, and poor foot clearance- Left, Distance walked: various clinic distances, Assistive device utilized:None, Level of assistance: SBA, and Comments: Pt has increased stiffness noted by heavy forward lean on onset of standing and gait - improves with distance.  FUNCTIONAL TESTS:  5 times sit to stand: 14.87 sec no UE support, intermittent BLE touching chair on rise 2 minute walk test: TBA 10 meter walk test: 11.75 sec SBA no AD = 0.85 m/sec OR 2.81 ft/sec  PATIENT SURVEYS:  None completed due to time.                                                                                                                                TREATMENT DATE: 05/13/2024  - :  311 ft SBA, pt reports tightness most prominent with turning, cues to manage R oriented clinic obstacles -PT provides PROM to BLE into hip/knee/DF and hamstrings - biasing medial and lateral components, IR/ER w/ various passive piriformis stretch positions -Supine butterfly stretch 2x45 seconds -Supine knee-to-chest w/ towel 2x30 sec each -LTRs 2x20 > wide ER/IR hook-lying stretch -Hook-lying PPT 2x10, return demo and multimodal cuing - cued to prevent neck flexion during movement -Modified Thomas stretch x1 minute each side  PATIENT EDUCATION: Education details: Continue HEP as modified.  Assessments used and outcome interpretations.   Person educated: Patient Education method: Explanation, Demonstration, Verbal cues, and Handouts Education comprehension: verbalized understanding and needs further education  HOME EXERCISE PROGRAM: Access Code: [email or web address] URL: [email or web address] Date: 05/13/2024 Prepared by: Daved Bull  Exercises - Seated Hamstring Stretch  - 1 x daily - 1 sets - 2-3 reps - 30 seconds hold - Seated Gastroc Stretch  with Strap  - 1 x daily - 5 x weekly - 1 sets - 2-3 reps - 30 seconds hold - Supine Lower Trunk Rotation  - 1 x daily - 4-5 x weekly - 3 sets - 10 reps - Supine Posterior Pelvic Tilt  - 1 x daily - 4-5 x weekly - 2 sets - 10 reps - Hooklying Single Knee to Chest Stretch with Towel  - 1 x daily - 5 x weekly - 1 sets - 2-3 reps - 30 seconds hold - Supine Hip Internal and External Rotation  - 1 x daily - 4-5 x weekly - 2 sets - 10 reps - Supine Butterfly Groin Stretch  - 1 x daily - 5 x weekly - 1 sets - 2-3 reps - 30 seconds hold - Modified Thomas Stretch  - 1 x daily - 5 x weekly - 1 sets - 1-2 reps - 60 seconds hold  GOALS: Goals reviewed with patient? Yes  SHORT TERM GOALS: Target date: 06/03/2024  Pt will be independent and compliant with introductory BLE stretching, strength and balance focused HEP in order  to maintain functional progress and improve mobility. Baseline:  Established on eval Goal status: INITIAL  2.  to be assessed w/ STG/LTG set as appropriate. Baseline: 311 ft SBA (8/29) Goal status: REVISED -D/C  3.  Pt will decrease 5xSTS to </=12 seconds w/o UE support and w/ full BLE clearance in order to demonstrate decreased risk for falls and improved functional bilateral LE strength and power. Baseline: 14.87 sec no UE support, intermittent BLE touching chair on rise Goal status: INITIAL  4.  Pt will demonstrate a gait speed of >/=3.01 feet/sec in order to decrease risk for falls. Baseline: 2.81 ft/sec SBA no AD Goal status: INITIAL  LONG TERM GOALS: Target date: 07/01/2024  Pt will be independent and compliant with advanced and finalized BLE stretching, strength and balance focused HEP in order to maintain functional progress and improve mobility. Baseline: Established on eval Goal status: INITIAL  2.  Pt will ambulate>/=411 feet on to demonstrate improved endurance for functional tasks in home and community. Baseline: 311 ft SBA (8/29) Goal status:  INITIAL  3.  Pt will demonstrate a gait speed of >/=3.21 feet/sec in order to decrease risk for falls. Baseline: 2.81 ft/sec SBA no AD Goal status: INITIAL  4.  Patient will be compliant to formal walking program >/= 3 days per week to improve aerobic tolerance and ambulatory mechanics. Baseline: To be established. Goal status: INITIAL  ASSESSMENT:  CLINICAL IMPRESSION: Emphasis of skilled session on improving pelvic, hip and lumbar mobility as pt demonstrates excessive tightness in these regions.  He presents with hesitancy in his gait and antalgic stiffness of stride.  No noted changes in immediate stride performance on ambulation out of clinic following stretching program, but PT modified HEP to further assess carryover.  He continues to benefit from skilled PT to improve gait mechanics and pain management of BLE.  Continue per POC.  OBJECTIVE IMPAIRMENTS: Abnormal gait, decreased activity tolerance, decreased balance, decreased endurance, decreased knowledge of condition, decreased knowledge of use of DME, decreased mobility, difficulty walking, decreased ROM, hypomobility, improper body mechanics, and postural dysfunction.   ACTIVITY LIMITATIONS: standing, squatting, stairs, transfers, and locomotion level  PARTICIPATION LIMITATIONS: community activity, occupation, and yard work  PERSONAL FACTORS: Age, Fitness, Past/current experiences, Time since onset of injury/illness/exacerbation, and 1-2 comorbidities: MS and HTN are also affecting patient's functional outcome.   REHAB POTENTIAL: Excellent  CLINICAL DECISION MAKING: Evolving/moderate complexity  EVALUATION COMPLEXITY: Moderate  PLAN:  PT FREQUENCY: 1x/week  PT DURATION: 8 weeks  PLANNED INTERVENTIONS: 97164- PT Re-evaluation, 97750- Physical Performance Testing, 97110-Therapeutic exercises, 97530- Therapeutic activity, [id number]- Neuromuscular re-education, 97535- Self Care, [id number]- Manual therapy, [id number]- Gait training, [phone]-  Aquatic Therapy, [phone]- Electrical stimulation (manual), [phone] (1-2 muscles), [id number] (3+ muscles)- Dry Needling, Patient/Family education, Balance training, Stair training, Taping, Joint mobilization, Spinal mobilization, Vestibular training, DME instructions, Cryotherapy, and Moist heat  PLAN FOR NEXT SESSION: Expand HEP -  supine therex and standing balance.  PROM/PT stretching to BLE - focus on hip flexor and IR/ER.  SciFit for ROM/strength.  Pt interested in treadmill - establish walking program for 3 days a week!    Daved KATHEE Bull, PT, DPT 05/13/2024, 1:16 PM

## 2024-05-13 NOTE — Patient Instructions (Signed)
 Access Code: [email or web address] URL: [email or web address] Date: 05/13/2024 Prepared by: Daved Bull  Exercises - Seated Hamstring Stretch  - 1 x daily - 1 sets - 2-3 reps - 30 seconds hold - Seated Gastroc Stretch with Strap  - 1 x daily - 5 x weekly - 1 sets - 2-3 reps - 30 seconds hold - Supine Lower Trunk Rotation  - 1 x daily - 4-5 x weekly - 3 sets - 10 reps - Supine Posterior Pelvic Tilt  - 1 x daily - 4-5 x weekly - 2 sets - 10 reps - Hooklying Single Knee to Chest Stretch with Towel  - 1 x daily - 5 x weekly - 1 sets - 2-3 reps - 30 seconds hold - Supine Hip Internal and External Rotation  - 1 x daily - 4-5 x weekly - 2 sets - 10 reps - Supine Butterfly Groin Stretch  - 1 x daily - 5 x weekly - 1 sets - 2-3 reps - 30 seconds hold - Modified Thomas Stretch  - 1 x daily - 5 x weekly - 1 sets - 1-2 reps - 60 seconds hold

## 2024-05-18 ENCOUNTER — Ambulatory Visit: Admitting: Physical Therapy

## 2024-05-18 ENCOUNTER — Telehealth: Payer: Self-pay | Admitting: Family Medicine

## 2024-05-18 NOTE — Telephone Encounter (Signed)
 Pt reports the medication prescribed to him for his leg pain is not helping, pt aware of his upcoming appointment. Pt asking for a call from RN to discuss leg pain.

## 2024-05-18 NOTE — Telephone Encounter (Signed)
 Pt saw Patrick Gilbert 05/04/24. Called 05/13/24 and scheduled appt with Dr. Vear on 05/26/24 it appears for leg pain per notes.  Per Patrick Gilbert's last visit note:     I called pt at [phone]. LVM

## 2024-05-19 NOTE — Telephone Encounter (Signed)
 Pt called to request to speak to nurse , Informed Nurse is not available at this time ,Informed PT ,He will get a call back

## 2024-05-19 NOTE — Telephone Encounter (Signed)
 I called and spoke with patient regarding the below. Pt said he is having lower back pain and achy sensation in right leg. Pt states he is still taking  etodalac 400 mg BID and no relief, he takes Pregabalin  as well.   I explained he should continue Rx and follow up next week at [facility]. I suggested he maybe invest in a heating pad to help with pain as well.  Pt verbalized he understood.

## 2024-05-26 ENCOUNTER — Ambulatory Visit: Admitting: Physical Therapy

## 2024-05-26 ENCOUNTER — Ambulatory Visit: Admitting: Neurology

## 2024-06-03 ENCOUNTER — Ambulatory Visit: Attending: Family Medicine | Admitting: Physical Therapy

## 2024-06-10 ENCOUNTER — Ambulatory Visit: Admitting: Physical Therapy

## 2024-06-16 ENCOUNTER — Ambulatory Visit: Admitting: Physical Therapy

## 2024-06-17 ENCOUNTER — Ambulatory Visit: Admitting: Physical Therapy

## 2024-06-21 ENCOUNTER — Telehealth: Payer: Self-pay | Admitting: Physical Therapy

## 2024-06-21 ENCOUNTER — Ambulatory Visit: Admitting: Physical Therapy

## 2024-06-21 NOTE — Telephone Encounter (Signed)
 Was unable to reach pt at primary number in chart, will proceed with discharge per no show policy.  Daved Bull, PT, DPT

## 2024-06-24 ENCOUNTER — Ambulatory Visit: Admitting: Physical Therapy

## 2024-06-28 ENCOUNTER — Ambulatory Visit: Admitting: Physical Therapy

## 2024-07-01 ENCOUNTER — Ambulatory Visit: Admitting: Physical Therapy

## 2024-07-07 ENCOUNTER — Ambulatory Visit: Admitting: Physical Therapy

## 2024-07-11 ENCOUNTER — Other Ambulatory Visit: Payer: Self-pay | Admitting: Neurology

## 2024-09-02 ENCOUNTER — Encounter: Payer: Self-pay | Admitting: Internal Medicine

## 2024-09-02 ENCOUNTER — Other Ambulatory Visit: Payer: Self-pay

## 2024-09-02 ENCOUNTER — Ambulatory Visit: Admitting: Internal Medicine

## 2024-09-02 VITALS — BP 122/80 | HR 83 | Temp 97.7°F | Ht 67.0 in | Wt 216.8 lb

## 2024-09-02 DIAGNOSIS — K219 Gastro-esophageal reflux disease without esophagitis: Secondary | ICD-10-CM | POA: Diagnosis not present

## 2024-09-02 DIAGNOSIS — J310 Chronic rhinitis: Secondary | ICD-10-CM | POA: Diagnosis not present

## 2024-09-02 DIAGNOSIS — R053 Chronic cough: Secondary | ICD-10-CM

## 2024-09-02 DIAGNOSIS — Z720 Tobacco use: Secondary | ICD-10-CM

## 2024-09-02 MED ORDER — FLUTICASONE PROPIONATE 50 MCG/ACT NA SUSP: 2.0000 | Freq: Every day | NASAL | 5 refills | Status: AC

## 2024-09-02 MED ORDER — ALBUTEROL SULFATE HFA 108 (90 BASE) MCG/ACT IN AERS: 2.0000 | INHALATION_SPRAY | Freq: Four times a day (QID) | RESPIRATORY_TRACT | 1 refills | Status: AC | PRN

## 2024-09-02 MED ORDER — AZELASTINE HCL 0.1 % NA SOLN: 2.0000 | Freq: Every evening | NASAL | 5 refills | Status: AC

## 2024-09-02 MED ORDER — LEVOCETIRIZINE DIHYDROCHLORIDE 5 MG PO TABS: 5.0000 mg | ORAL_TABLET | Freq: Every day | ORAL | 5 refills | Status: AC

## 2024-09-02 MED ORDER — FAMOTIDINE 20 MG PO TABS: 20.0000 mg | ORAL_TABLET | Freq: Two times a day (BID) | ORAL | 5 refills | Status: AC | PRN

## 2024-09-02 MED ORDER — MONTELUKAST SODIUM 10 MG PO TABS: 10.0000 mg | ORAL_TABLET | Freq: Every day | ORAL | 5 refills | Status: AC

## 2024-09-02 NOTE — Patient Instructions (Addendum)
 Mixed Rhinitis: - Positive skin test 2022: mold/weeds  - Use nasal saline rinses before nose sprays such as with Neilmed Sinus Rinse.  Use distilled water.   - Use Flonase  2 sprays each nostril in the morning. Aim upward and outward. - Use Azelastine  2 sprays each nostril in the evening. Aim upward and outward. - Use Xyzal 5 mg daily. Stop Carbinoxamine .   - Use Singulair  (Montelukast ) 10mg  daily. Stop if there are any mood/behavioral changes.  GERD - Start Pepcid (Famotidine) 20mg  twice daily as needed for reflux/heartburn.  -Avoid lying down for at least two hours after a meal or after drinking acidic beverages, like soda, or other caffeinated beverages. This can help to prevent stomach contents from flowing back into the esophagus. -Keep your head elevated while you sleep. Using an extra pillow or two can also help to prevent reflux. -Eat smaller and more frequent meals each day instead of a few large meals. This promotes digestion and can aid in preventing heartburn. -Wear loose-fitting clothes to ease pressure on the stomach, which can worsen heartburn and reflux. -Quit smoking. Smoking can increase the production of stomach acid and reduce the function of the lower esophageal sphincter, the muscle that keeps acid and other stomach content from reentering the esophagus. Smoking can also decrease the amount of saliva, which neutralizes acid produced by the body.  -Reduce excess weight around the midsection. This can ease pressure on the stomach. Such pressure can force some stomach contents back up the esophagus.   Chronic Cough - Work on smoking cessation.   - Will refer to [facility] for chronic cough in setting of long term history of tobacco use, discussed possibly COPD.  Also likely needs lung cancer screening.   - Use Albuterol 2 puffs every 4-6 hours for persistent cough, wheezing or shortness of breath.

## 2024-09-02 NOTE — Progress Notes (Addendum)
 "  NEW PATIENT  Date of Service/Encounter:  09/02/2024  Consult requested by: [facility] [address]   Subjective:   Patrick Gilbert (DOB: 1956-04-04) is a 68 y.o. male who presents to the clinic on 09/02/2024 with a chief complaint of Establish Care, Nasal Congestion (States having sinus drainage cause in coughing, sneezing ,watery eyes, itchy eye, itchy throat x 3 years.), Cough, and Sinus Problem .    History obtained from: chart review and patient.    Rhinitis:  Started over 3-4 years ago.   Symptoms include: nasal congestion, rhinorrhea, post nasal drainage, sneezing, and itchy eyes, chronic cough  Occurs year-round Potential triggers: not sure, mold on carpet   Treatments tried:  Singular Karbinal  not helping   Previous allergy testing: yes; 04/2021 positive to weeds and molds  History of sinus surgery: no Nonallergic triggers: eating, strong scent   Chronic Cough:   Notes ongoing for many years.  Does note trouble with post nasal drainage and lots of mucous production when he coughs.  Sometimes gets short of breath with heavy activity.  Also has trouble with chronic reflux and sour taste in mouth.  He is not taking anything for heartburn/reflux.  He started smoking around age [age].  Currently 1/2 ppd but highest was 1ppd.  No history of asthma or use of inhalers. Denies undergoing lung/chest CT.     Reviewed:  2022: seen by Allergy & Asthma for allergic rhinitis to weeds and molds, use Azelastine , Singulair , Karbinal , Flonase .  05/04/2024: seen by Neuro for MS with worsening pain; on pregabalin , duloxetine , etodolac .  Refer to PT.    Past Medical History: Past Medical History:  Diagnosis Date   Headache 03/2016   High blood pressure    High cholesterol    Past Surgical History: Past Surgical History:  Procedure Laterality Date   Repair of left shoulder laceration     cut with broken glass bottle.  Missed a major artery by just a small measurement per patient     Family History: Family History  Problem Relation Age of Onset   Allergic rhinitis Mother    Hypertension Mother    Heart disease Mother        unknown open heart surgery   Hypertension Father    Peripheral vascular disease Father    Allergic rhinitis Sister    Heart disease Brother        Unknown heart issue   Seizures Daughter    Multiple sclerosis Cousin     Social History:  Flooring in bedroom: tile Pets: none Tobacco use/exposure: 1/2ppd since age 16 Job: custodian   Medication List:  Allergies as of 09/02/2024       Reactions   Lipitor [atorvastatin  Calcium ] Other (See Comments)   Muscle pain, which he does not have with Rosuvastatin .        Medication List        Accurate as of September 02, 2024 10:32 AM. If you have any questions, ask your nurse or doctor.          Carbinoxamine  Maleate 4 MG Tabs TAKE 1 TABLET BY MOUTH TWICE DAILY AS NEEDED FOR RUNNY NOSE.   DULoxetine  60 MG capsule Commonly known as: CYMBALTA  TAKE 1 CAPSULE(60 MG) BY MOUTH DAILY   etodolac  400 MG tablet Commonly known as: LODINE  Take 1 tablet (400 mg total) by mouth 2 (two) times daily.   fluticasone  50 MCG/ACT nasal spray Commonly known as: FLONASE  SHAKE LIQUID AND USE 2 SPRAYS IN EACH NOSTRIL  EVERY DAY AS NEEDED FOR NASAL CONGESTION   glipiZIDE 10 MG 24 hr tablet Commonly known as: GLUCOTROL XL Take 10 mg by mouth daily.   losartan -hydrochlorothiazide  50-12.5 MG tablet Commonly known as: HYZAAR Take 1 tablet by mouth once daily   montelukast  10 MG tablet Commonly known as: SINGULAIR  Take 1 tablet (10 mg total) by mouth daily.   pregabalin  100 MG capsule Commonly known as: Lyrica  Take 1 capsule (100 mg total) by mouth 2 (two) times daily.   rosuvastatin  20 MG tablet Commonly known as: Crestor  1/2 tab by mouth daily with evening meal   Teriflunomide  14 MG Tabs One po qd   Vitamin D  (Ergocalciferol ) 1.25 MG (50000 UNIT) Caps capsule Commonly known as:  DRISDOL  Take 1 capsule (50,000 Units total) by mouth every 7 (seven) days.         REVIEW OF SYSTEMS: Pertinent positives and negatives discussed in HPI.   Objective:   Physical Exam: BP 122/80   Pulse 83   Temp 97.7 F (36.5 C)   Ht 5' 7 (1.702 m)   Wt 216 lb 12.8 oz (98.3 kg)   SpO2 98%   BMI 33.96 kg/m  Body mass index is 33.96 kg/m. GEN: alert, well developed HEENT: clear conjunctiva, nose with + mild inferior turbinate hypertrophy, pink nasal mucosa, +clear rhinorrhea, + cobblestoning HEART: regular rate and rhythm, no murmur LUNGS: clear to auscultation bilaterally, + wet coughing, unlabored respiration ABDOMEN: soft, non distended  SKIN: no rashes or lesions  Spirometry:  Tracings reviewed. His effort: It was hard to get consistent efforts and there is a question as to whether this reflects a maximal maneuver. FVC: 2.8L, 85% predicted; post 2.8, 85% FEV1: 1.68L, 66% predicted; post 2.22, 88% FEV1/FVC ratio: 60% Interpretation: Spirometry consistent with moderate obstructive disease. Reversibility present.  Please see scanned spirometry results for details.  Assessment:   1. Mixed rhinitis   2. Chronic cough   3. Tobacco use   4. Gastroesophageal reflux disease, unspecified whether esophagitis present     Plan/Recommendations:  Mixed Rhinitis: - Positive skin test 2022: mold/weeds  - Use nasal saline rinses before nose sprays such as with Neilmed Sinus Rinse.  Use distilled water.   - Use Flonase  2 sprays each nostril in the morning. Aim upward and outward. - Use Azelastine  2 sprays each nostril in the evening. Aim upward and outward. - Use Xyzal 5 mg daily. Stop Carbinoxamine .   - Use Singulair  (Montelukast ) 10mg  daily. Stop if there are any mood/behavioral changes. - If symptoms remain uncontrolled, we will need to consider retesting.  In terms of AIT, we would need to discuss further due to your history of multiple sclerosis.  Letter given to help with  carpet removal in home with mold.   GERD - Start Pepcid (Famotidine) 20mg  twice daily as needed for reflux/heartburn.  -Avoid lying down for at least two hours after a meal or after drinking acidic beverages, like soda, or other caffeinated beverages. This can help to prevent stomach contents from flowing back into the esophagus. -Keep your head elevated while you sleep. Using an extra pillow or two can also help to prevent reflux. -Eat smaller and more frequent meals each day instead of a few large meals. This promotes digestion and can aid in preventing heartburn. -Wear loose-fitting clothes to ease pressure on the stomach, which can worsen heartburn and reflux. -Quit smoking. Smoking can increase the production of stomach acid and reduce the function of the lower esophageal sphincter, the  muscle that keeps acid and other stomach content from reentering the esophagus. Smoking can also decrease the amount of saliva, which neutralizes acid produced by the body.  -Reduce excess weight around the midsection. This can ease pressure on the stomach. Such pressure can force some stomach contents back up the esophagus.   Chronic Cough - Cough is likely multifactorial from reflux, post nasal drainage and tobacco use.   - Spirometry today with reversible obstruction but with poor technique. Also there was an error with entering the patient demographics data and I am not sure if the software was able to update the results once we changed this.   - Work on smoking cessation.   - Use Albuterol 2 puffs every 4-6 hours for persistent cough, wheezing or shortness of breath.   - Will refer to Pulm for chronic cough in setting of long term history of tobacco use, discussed possibly COPD.  Also likely needs lung cancer screening.     Return in about 6 weeks (around 10/14/2024).  Arleta Blanch, MD [facility]         "

## 2024-09-28 ENCOUNTER — Telehealth: Payer: Self-pay | Admitting: Internal Medicine

## 2024-09-28 ENCOUNTER — Telehealth: Payer: Self-pay | Admitting: Neurology

## 2024-09-28 DIAGNOSIS — G35D Multiple sclerosis, unspecified: Secondary | ICD-10-CM

## 2024-09-28 DIAGNOSIS — M791 Myalgia, unspecified site: Secondary | ICD-10-CM

## 2024-09-28 DIAGNOSIS — M5416 Radiculopathy, lumbar region: Secondary | ICD-10-CM

## 2024-09-28 DIAGNOSIS — S2232XA Fracture of one rib, left side, initial encounter for closed fracture: Secondary | ICD-10-CM

## 2024-09-28 DIAGNOSIS — M4807 Spinal stenosis, lumbosacral region: Secondary | ICD-10-CM

## 2024-09-28 NOTE — Telephone Encounter (Signed)
 I called pt.  He states that he is having thigh pain bilaterally.   Wakes up stiff, then then when walking hurts from hips down.  Going on for about 2 wks.  He is own pregabalin  100mg  bid, duloxetine  60mg  daily, added etodolac  400mg  bid, last seen, stated helped for a little while but is not doing anything now.  He is on aubagio  for MS.  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

## 2024-09-28 NOTE — Telephone Encounter (Signed)
 Pt called in, stated that his legs ache and he's been having some difficulty walking. He stated the pain meds that were prescribed to him are not working. He would like a call back to discuss. Best call back # is [phone]

## 2024-09-28 NOTE — Telephone Encounter (Signed)
 Patrick Gilbert refused Pulmonary referral

## 2024-10-04 ENCOUNTER — Telehealth: Payer: Self-pay | Admitting: Family Medicine

## 2024-10-04 NOTE — Telephone Encounter (Signed)
 I called and spoke to pt and he is willing to go to pain management.   In [place] if possible.

## 2024-10-04 NOTE — Addendum Note (Signed)
 Addended by: CARY NO L on: 10/04/2024 03:12 PM   Modules accepted: Orders

## 2024-10-04 NOTE — Telephone Encounter (Signed)
 Referral for pain clinic fax to [facility]. Phone: [phone], Fax: [phone]

## 2024-10-06 ENCOUNTER — Ambulatory Visit: Admitting: Allergy & Immunology

## 2024-10-14 ENCOUNTER — Ambulatory Visit: Admitting: Internal Medicine

## 2024-10-19 NOTE — Telephone Encounter (Signed)
 Visit note received and placed in Amy's office for review.

## 2024-12-29 ENCOUNTER — Ambulatory Visit: Admitting: Neurology
